# Patient Record
Sex: Female | Born: 1970 | Race: Black or African American | Hispanic: No | Marital: Single | Smoking: Never smoker
Health system: Southern US, Community
[De-identification: ages and names within clinical notes are randomized; demographics above are authoritative.]

## PROBLEM LIST (undated history)

## (undated) DIAGNOSIS — K5900 Constipation, unspecified: Secondary | ICD-10-CM

## (undated) DIAGNOSIS — K219 Gastro-esophageal reflux disease without esophagitis: Secondary | ICD-10-CM

## (undated) DIAGNOSIS — I10 Essential (primary) hypertension: Secondary | ICD-10-CM

## (undated) DIAGNOSIS — J302 Other seasonal allergic rhinitis: Secondary | ICD-10-CM

## (undated) DIAGNOSIS — E785 Hyperlipidemia, unspecified: Secondary | ICD-10-CM

## (undated) DIAGNOSIS — J45909 Unspecified asthma, uncomplicated: Secondary | ICD-10-CM

## (undated) DIAGNOSIS — D649 Anemia, unspecified: Secondary | ICD-10-CM

## (undated) DIAGNOSIS — R519 Headache, unspecified: Secondary | ICD-10-CM

## (undated) DIAGNOSIS — Z139 Encounter for screening, unspecified: Secondary | ICD-10-CM

## (undated) DIAGNOSIS — G44209 Tension-type headache, unspecified, not intractable: Secondary | ICD-10-CM

## (undated) DIAGNOSIS — R42 Dizziness and giddiness: Secondary | ICD-10-CM

## (undated) DIAGNOSIS — Z1231 Encounter for screening mammogram for malignant neoplasm of breast: Secondary | ICD-10-CM

## (undated) HISTORY — DX: Hyperlipidemia, unspecified: E78.5

## (undated) HISTORY — DX: Other seasonal allergic rhinitis: J30.2

## (undated) HISTORY — PX: HERNIA REPAIR: SHX51

## (undated) HISTORY — PX: DILATION AND CURETTAGE OF UTERUS: SHX78

## (undated) HISTORY — PX: UPPER GASTROINTESTINAL ENDOSCOPY: SHX188

## (undated) HISTORY — DX: Constipation, unspecified: K59.00

## (undated) HISTORY — PX: TUBAL LIGATION: SHX77

## (undated) HISTORY — PX: BREAST BIOPSY: SHX20

---

## 2007-07-17 LAB — H PYLORI, TISSUE (LAB): H. pylori from tissue: NEGATIVE

## 2007-07-17 NOTE — Op Note (Signed)
Name: Vanessa Kramer, Vanessa Kramer  MR #: [id number] Surgeon: Daralene Milch, M.D.  Account #: [id number] Surgery Date: 07/17/2007  DOB: 10/08/1970  Age: 37 Location:     OPERATIVE REPORT        PROCEDURE: Esophagogastroduodenoscopy.    INDICATION: This is a young 37 year old African American woman who  underwent a Nissen fundoplication 10 years ago, is now having recurrent  acid reflux, rule out esophagitis, rule out incompetent wrap.    PREMEDICATIONS:  1. Demerol 50 mg intravenously.  2. Versed 5 mg intravenously.    ASSISTANT: Nicky Pugh.    SCOPE: Olympus video upper endoscope.    DESCRIPTION OF PROCEDURE: Informed consent obtained after procedure  explained in detail including the risks, benefits, and alternatives.  Oropharynx topically anesthetized, Cetacaine spray and bite block inserted.  The esophagus contained normal mucosa. No evidence of esophagitis. There  was a normal Z-line at 40 cm from the incisors. There was, however,  decreased lower esophageal sphincter tone. Certainly no stenosis was  encountered as we advanced to the stomach and retroflexed the scope. We  had a difficult time visualizing the wrap due to the patient's ongoing  retching, which resulted in a small amount of bleeding from her retching at  the GE junction. The distal stomach and the antrum had diffuse gastritis  without erosion. Biopsy obtained for Helicobacter pylori. The scope  advanced into the duodenum where normal mucosa seen to the second portion.  Scope removed, procedure terminated.    FINDINGS:  1. Decreased lower esophageal sphincter tone, prior Nissen  fundoplication.  2. Gastritis.    RECOMMENDATION: The patient will likely need to be begun on a proton pump  inhibitor therapy daily and continue, and we will consider her for barium  swallow.          E-Signed By  Daralene Milch, M.D. 07/25/2007 13:19  Daralene Milch, M.D.    cc: Otila Kluver, M.D.   Daralene Milch, M.D.         MM/FI2; D: 07/17/2007 11:36 A; T: 07/17/2007 11:05 P; Doc# [id number]; Job#  [id number]

## 2007-07-17 NOTE — Op Note (Signed)
Op Notes signed by  at 07/25/07 1319                  Author: Daralene Milch, MD  Service: --  Author Type: Physician            Filed:   Date of Service: 07/17/07 2305  Status: Signed          <!--EPICS--> Name:      Vanessa Kramer, Vanessa Kramer<BR> MR #:      [id number]                    Surgeon:        Daralene Milch, M.D.<BR> Account #: [id number]                  Surgery Date:   07/17/2007<BR> DOB:       [birth year]<BR> Age:       37                           Location:<BR> <BR>                              OPERATIVE REPORT<BR> <BR> <BR> <BR> PROCEDURE:  Esophagogastroduodenoscopy.<BR> <BR> INDICATION:   This is a young 37 year old African American woman who<BR> underwent a Nissen fundoplication 10 years ago, is now having recurrent<BR> acid reflux, rule out esophagitis, rule out incompetent wrap.<BR> <BR> PREMEDICATIONS:<BR> 1.     Demerol 50 mg intravenously.<BR>  2.     Versed 5 mg intravenously.<BR> <BR> ASSISTANT:  Nicky Pugh.<BR> <BR> SCOPE:  Olympus video upper endoscope.<BR> <BR> DESCRIPTION OF PROCEDURE:  Informed consent obtained after procedure<BR> explained in detail including the risks, benefits, and  alternatives.<BR> Oropharynx topically anesthetized, Cetacaine spray and bite block inserted.<BR> The esophagus contained normal mucosa.  No evidence of esophagitis.  There<BR> was a normal Z-line at 40 cm from the incisors.  There was, however,<BR> decreased  lower esophageal sphincter tone.  Certainly no stenosis was<BR> encountered as we advanced to the stomach and retroflexed the scope.  We<BR> had a difficult time visualizing the wrap due to the patient's ongoing<BR> retching, which resulted in a small  amount of bleeding from her retching at<BR> the GE junction.  The distal stomach and the antrum had diffuse gastritis<BR> without erosion.  Biopsy obtained for Helicobacter pylori.  The scope<BR> advanced into the duodenum where normal mucosa seen to  the second portion.<BR> Scope removed,  procedure terminated.<BR> <BR> FINDINGS:<BR> 1.     Decreased lower esophageal sphincter tone, prior Nissen<BR> fundoplication.<BR> 2.     Gastritis.<BR> <BR> RECOMMENDATION:  The patient will likely need to be begun  on a proton pump<BR> inhibitor therapy daily and continue, and we will consider her for barium<BR> swallow.<BR> <BR> <BR> <BR> <BR> E-Signed By<BR> Daralene Milch, M.D. 07/25/2007 13:19<BR> Daralene Milch, M.D.<BR> <BR> cc:   Otila Kluver, M.D.<BR>        Daralene Milch, M.D.<BR> <BR> <BR> <BR> MM/FI2; D: 07/17/2007 11:36 A; T: 07/17/2007 11:05 P; Doc# [id number]; Job#<BR> [id number]<BM> <!--EPICE-->

## 2009-06-24 MED ORDER — LISINOPRIL 10 MG TAB
10 mg | ORAL_TABLET | Freq: Every day | ORAL | Status: DC
Start: 2009-06-24 — End: 2009-09-09

## 2009-06-24 MED ORDER — AZITHROMYCIN 250 MG TAB
250 mg | ORAL_TABLET | ORAL | Status: AC
Start: 2009-06-24 — End: 2009-06-29

## 2009-06-24 MED ORDER — MECLIZINE 12.5 MG TAB
12.5 mg | ORAL_TABLET | Freq: Three times a day (TID) | ORAL | Status: DC | PRN
Start: 2009-06-24 — End: 2009-09-24

## 2009-06-24 MED ORDER — PROMETHAZINE 25 MG TAB
25 mg | ORAL_TABLET | Freq: Four times a day (QID) | ORAL | Status: DC | PRN
Start: 2009-06-24 — End: 2011-09-18

## 2009-06-24 NOTE — Progress Notes (Signed)
HISTORY OF PRESENT ILLNESS  Vanessa Kramer is a 39 y.o. female who comes in with four day history of vertigo.  She's had this before, and simply notices it when she does have allergic problems with the environment.  She is taking Zyrtec, which is not helped.  She said no falls yet but feels like she could have.  She is a non-smoker.  She is also in to establish care.  She's had a history of high cholesterol and high blood pressure, but is not on treatment for either at this time.  HPI    Review of Systems   Constitutional: Negative for weight loss and malaise/fatigue.   HENT: Positive for congestion.    Eyes: Negative for double vision.   Respiratory: Negative for cough and shortness of breath.    Cardiovascular: Negative for orthopnea.   Gastrointestinal: Positive for nausea. Negative for vomiting and abdominal pain.   Musculoskeletal: Negative for myalgias.   Skin: Negative for rash.   Neurological: Positive for dizziness. Negative for headaches.   Psychiatric/Behavioral: The patient is not nervous/anxious.      BP 155/96   Pulse 94   Temp(Src) 97.8 ??F (36.6 ??C) (Oral)   Resp 18   Ht 5' 5.5" (1.664 m)   Wt 111 lb 12.8 oz (50.712 kg)   BMI 18.32 kg/m2   SpO2 100%  Physical Exam   Nursing note and vitals reviewed.  Constitutional: She is oriented to person, place, and time. She appears well-developed and well-nourished.   HENT:   Head: Normocephalic.   Right Ear: External ear normal.   Left Ear: External ear normal.   Mouth/Throat: Mucous membranes are dry. Oropharyngeal exudate and posterior oropharyngeal erythema present. No posterior oropharyngeal edema.         Eyes: Conjunctivae are normal. Pupils are equal, round, and reactive to light. Right conjunctiva is not injected. Left conjunctiva is not injected. Left eye exhibits nystagmus.         Neck: Normal range of motion. Neck supple. No thyromegaly present.   Cardiovascular: Normal rate, normal heart sounds and intact distal pulses.    No murmur heard.   Pulmonary/Chest: Effort normal and breath sounds normal. She has no wheezes. She has no rales.   Abdominal: Soft. Bowel sounds are normal. She exhibits no mass. She has no guarding.   Musculoskeletal: Normal range of motion. She exhibits no edema.   Lymphadenopathy:     She has no cervical adenopathy.   Neurological: She is alert and oriented to person, place, and time. She has normal reflexes.   Skin: Skin is warm.   Psychiatric: Her behavior is normal. Judgment and thought content normal.       ASSESSMENT and PLAN  1. Dizziness (780.4A)  meclizine (ANTIVERT) 12.5 mg tablet, promethazine (PHENERGAN) 25 mg tablet   2. HTN (hypertension) (401.9AF)  lisinopril (PRINIVIL, ZESTRIL) 10 mg tablet   3. Vertigo (780.4K)     4. Pharyngitis (462Y)  azithromycin (ZITHROMAX Z-PAK) 250 mg tablet   5. Routine health maintenance (V72.9C)  CBC WITH AUTOMATED DIFF, LIPID PANEL, TSH, 3RD GENERATION, T4, FREE, METABOLIC PANEL, COMPREHENSIVE       [name] was seen today for establish care and dizziness.    Diagnoses and associated orders for this visit:    Dizziness  - meclizine (ANTIVERT) 12.5 mg tablet; Take 1 Tab by mouth three (3) times daily as needed.  - promethazine (PHENERGAN) 25 mg tablet; Take 1 Tab by mouth every six (6) hours as  needed for Nausea.    Htn (hypertension)  - lisinopril (PRINIVIL, ZESTRIL) 10 mg tablet; Take 1 Tab by mouth daily.    Vertigo    Pharyngitis  - azithromycin (ZITHROMAX Z-PAK) 250 mg tablet; Take  by mouth for 5 days. Take two tablets today then one tablet daily    Routine health maintenance  - CBC WITH AUTOMATED DIFF  - LIPID PANEL  - TSH, 3RD GENERATION  - T4, FREE  - METABOLIC PANEL, COMPREHENSIVE    Other Orders  - Cancel: ALT        Follow-up Disposition:  Return in about 2 months (around 08/24/2009), or as needed.   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  It is time with her history of blood pressure and cholesterol problems, we will check a full panel of blood work.  We will also start lisinopril 10 mg a day as she had been on treatment she stopped in the past.  She is warned about driving and climbing while the vertigo bothers her.  And I will take her out of work for the next four days.  She will call with her improvement with treatment next week.  She is asked to use mucinex is a mucolytic and Zantac as a non-drying histamine blocker.

## 2009-06-24 NOTE — Patient Instructions (Addendum)
MyChart Activation    Thank you for enrolling in MyChart. Please follow the instructions below to securely access your online medical record. MyChart allows you to send messages to your doctor, view your test results, renew your prescriptions, schedule appointments, and more.    How Do I Sign Up?    1. In your internet browser, go to [email or web address].  2. Click on the First Time User? Click Here link in the Sign In box. You will see the New Member Sign Up page.  3. Enter your MyChart Access Code exactly as it appears below. You will not need to use this code after you???ve completed the sign-up process. If you do not sign up before the expiration date, you must request a new code.    MyChart Access Code: [email or web address]  Expires: 08/23/09 02:21 PM     4. Enter the last four digits of your Social Security Number (xxxx) and Date of Birth (mm/dd/yyyy) as indicated and click Submit. You will be taken to the next sign-up page.  5. Create a MyChart ID. This will be your MyChart login ID and cannot be changed, so think of one that is secure and easy to remember.  6. Create a MyChart password. You can change your password at any time.  7. Enter your Password Reset Question and Answer. This can be used at a later time if you forget your password.   8. Enter your e-mail address. You will receive e-mail notification when new information is available in MyChart.  9. Click Sign Up. You can now view your medical record.    Additional Information    If you have questions, please call [phone].  Remember, MyChart is NOT to be used for urgent needs. For medical emergencies, dial 911.  English   Spanish  Dizziness: After Your Visit  Your Care Instructions   Dizziness is the feeling of unsteadiness or fuzziness in your head. It is different than having vertigo, which is a feeling that the room is spinning or that you are moving or falling. It is also different from lightheadedness, which is the feeling that you are about to faint.  It can be hard to know what causes dizziness. Some people feel dizzy when they have migraine headaches. Sometimes bouts of flu can make you feel dizzy. Some medical conditions, such as heart problems or high blood pressure, can make you feel dizzy. Many medicines can cause dizziness, including medicines for high blood pressure, pain, or anxiety.  If a medicine causes your symptoms, your doctor may recommend that you stop or change the medicine. If it is a problem with your heart, you may need medicine to help your heart work better. If there is no clear reason for your symptoms, your doctor may suggest watching and waiting for a while to see if the dizziness goes away on its own.  Follow-up care is a key part of your treatment and safety. Be sure to make and go to all appointments, and call your doctor if you are having problems. It???s also a good idea to know your test results and keep a list of the medicines you take.  How can you care for yourself at home?  ?? If your doctor recommends or prescribes medicine, take it exactly as directed. Call your doctor if you think you are having a problem with your medicine.   ?? Do not drive while you feel dizzy.   ?? Try to prevent falls. Steps you can take include:   ??  Using nonskid mats, adding grab bars near the tub, and using night-lights.   ?? Clearing your home so that walkways are free of anything you might trip on.   ?? Letting family and friends know that you have been feeling dizzy. This will help them know how to help you.  When should you call for help?  Call 911 anytime you think you may need emergency care. For example, call if:  ?? You passed out (lost consciousness).    ?? You have dizziness along with signs of a heart attack, which may include:   ?? Chest pain.   ?? Sweating.   ?? Shortness of breath.   ?? Nausea or vomiting.   ?? Pain that spreads from the chest to the neck, jaw, or one or both shoulders or arms.   ?? A fast or uneven pulse.  ?? Dizziness occurs with signs of a stroke. These may include:   ?? Sudden numbness, paralysis, or weakness in your face, arm, or leg, especially on only one side of your body.   ?? New problems with walking or balance.   ?? Drooling or slurred speech.   ?? Sudden vision changes.   ?? New problems speaking or understanding simple statements, or feeling confused.   ?? A sudden, severe headache that is different from past headaches.  Call your doctor now or seek immediate medical care if:  ?? You feel dizzy and have a fever, headache, or ringing in your ears.   ?? You have new or increased nausea and vomiting.   ?? Your dizziness does not go away or comes back.  Watch closely for changes in your health, and be sure to contact your doctor if:  ?? You do not get better as expected.    Where can you learn more?   Go to [facility]  Enter Q823 in the search box to learn more about "Dizziness: After Your Visit."   ?? 1995-2010 [employer]. Care instructions adapted under license by [employer] (which disclaims liability or warranty for this information). This care instruction is for use with your licensed healthcare professional. If you have questions about a medical condition or this instruction, always ask your healthcare professional. Healthwise disclaims any warranty or liability for your use of this information.  Content Version: 8.8.72453; Last Revised: September 23, 2009English   Spanish  High Blood Pressure: After Your Visit  Your Care Instructions   If your blood pressure is usually above 140/90, you have high blood pressure, or hypertension. Despite what a lot of people think, high blood pressure usually does not cause headaches or make you feel dizzy or lightheaded. It usually has no symptoms. But it does increase your risk for heart attack, stroke, and kidney or eye damage. The higher your blood pressure, the more your risk increases.  Changes in your lifestyle, such as staying at a healthy weight, may help you lower your blood pressure. Your treatment also will include medicine. If you stop taking your medicine, your blood pressure will go back up.  Follow-up care is a key part of your treatment and safety. Be sure to make and go to all appointments, and call your doctor if you are having problems. It???s also a good idea to know your test results and keep a list of the medicines you take.  How can you care for yourself at home?  Medical treatment  ?? Take your medicine exactly as prescribed. You may take one or more types of medicine  to lower your blood pressure. They include diuretics, beta-blockers, ACE inhibitors, calcium channel blockers, angiotensin II receptor blockers, and other medicines. Call your doctor if you think you are having a problem with your medicine.   ?? Your doctor may suggest that you take one low-dose aspirin (81 mg) a day. This can help reduce your risk of having a stroke or heart attack.   ?? See your doctor at least 2 times a year. You may need to see the doctor more often at first or until your blood pressure comes down.   ?? If you are taking blood pressure medicine, talk to your doctor before you take decongestants or anti-inflammatory medicine, such as ibuprofen. Some of these medicines can raise blood pressure.   ?? Learn how to check your blood pressure at home.  Lifestyle changes   ?? Stay at a healthy weight. This is especially important if you put on weight around the waist. Losing even 10 pounds can help you lower your blood pressure.   ?? If your doctor recommends it, get more exercise. Walking is a good choice. Bit by bit, increase the amount you walk every day. Try for at least 30 minutes on most days of the week. You also may want to swim, bike, or do other activities.   ?? Avoid or limit alcohol. Talk to your doctor about whether you can drink any alcohol.   ?? Limit salt.   ?? Eat plenty of fruits (such as bananas and oranges), vegetables, legumes, whole grains, and low-fat dairy products.   ?? Lower the amount of saturated fat in your diet. Saturated fat is found in animal products such as milk, cheese, and meat. Limiting these foods may help you lose weight and also lower your risk for heart disease.   ?? Do not smoke. Smoking increases your risk for heart attack and stroke. If you need help quitting, talk to your doctor about stop-smoking programs and medicines. These can increase your chances of quitting for good.  When should you call for help?  Call 911 anytime you think you may need emergency care. For example, call if:  ?? You have chest pain or pressure. This may occur with:   ?? Sweating.   ?? Shortness of breath.   ?? Nausea or vomiting.   ?? Pain that spreads from the chest to the neck, jaw, or one or both shoulders or arms.   ?? Dizziness or lightheadedness.   ?? A fast or uneven pulse.  After calling 911, chew 1 adult-strength aspirin. Wait for an ambulance. Do not try to drive yourself.   ?? You have signs of a stroke. These include:   ?? Sudden numbness, paralysis, or weakness in your face, arm, or leg, especially on only one side of your body.   ?? New problems with walking or balance.   ?? Sudden vision changes.   ?? New problems speaking or understanding simple statements, or feeling confused.   ?? A sudden, severe headache that is different from past headaches.   Call your doctor now or seek immediate medical care if:  ?? Your blood pressure rises suddenly.   ?? Your blood pressure is 180/110 or higher.   ?? You are dizzy or lightheaded, or you feel like you may faint.   ?? Your blood pressure is higher than 140/90 on two or more occasions.  Watch closely for changes in your health, and be sure to contact your doctor if:  ?? You do not  get better as expected.    Where can you learn more?   Go to [facility]  Enter [phone] in the search box to learn more about "High Blood Pressure: After Your Visit."   ?? 1995-2010 [employer]wise, [employer]. Care instructions adapted under license by [employer] (which disclaims liability or warranty for this information). This care instruction is for use with your licensed healthcare professional. If you have questions about a medical condition or this instruction, always ask your healthcare professional. Healthwise disclaims any warranty or liability for your use of this information.  Content Version: 8.8.72453; Last Revised: May 8, 2009English   Spanish  Vertigo: After Your Visit  Your Care Instructions  Vertigo is the feeling that you or your surroundings are moving when there is no actual movement. It is often described as a feeling of spinning, whirling, falling, or tilting. Vertigo may make you vomit or feel nauseated. You may have trouble standing or walking and may lose your balance.  Vertigo is often related to an inner ear problem, but it can have other more serious causes. If vertigo continues, you may need more tests to find its cause.  Follow-up care is a key part of your treatment and safety. Be sure to make and go to all appointments, and call your doctor if you are having problems. It???s also a good idea to know your test results and keep a list of the medicines you take.  How can you care for yourself at home?   ?? Do not lie flat on your back. Prop yourself up slightly. This may reduce the spinning feeling. Keep your eyes open.   ?? Move slowly so that you do not fall.   ?? If your doctor recommends medicine, take it exactly as directed.   ?? Do not drive while you are having vertigo.  Certain exercises, called Brandt-Daroff exercises, can help decrease vertigo. To do Brandt-Daroff exercises:  ?? Sit on the edge of a bed or sofa and quickly lie down on the side that causes the worst vertigo. Lie on your side with your ear down.   ?? Stay in this position for at least 30 seconds or until the vertigo goes away.   ?? Sit up. If this causes vertigo, wait for it to stop.   ?? Repeat the procedure on the other side.   ?? Repeat this 10 times. Do these exercises 2 times a day until the vertigo is gone.  When should you call for help?  Call 911 anytime you think you may need emergency care. For example, call if:  ?? You passed out (lost consciousness).   ?? Vertigo occurs with signs of a stroke. These include:   ?? Sudden numbness, paralysis, or weakness in your face, arm, or leg, especially on only one side of your body.   ?? New problems with walking or balance.   ?? Sudden vision changes.   ?? Drooling or slurred speech.   ?? New problems speaking or understanding simple statements, or feeling confused.   ?? A sudden, severe headache that is different from past headaches.  Call your doctor now or seek immediate medical care if:  ?? Vertigo occurs with a fever, a headache, or ringing in your ears.   ?? You have new or increased nausea and vomiting.  Watch closely for changes in your health, and be sure to contact your doctor if:  ?? Vertigo gets worse or happens more often.   ?? Vertigo has not gotten better  after 2 weeks.    Where can you learn more?   Go to [facility]  Enter [phone] in the search box to learn more about "Vertigo: After Your Visit."    ?? 1995-2010 Healthwise, [employer]. Care instructions adapted under license by [employer] (which disclaims liability or warranty for this information). This care instruction is for use with your licensed healthcare professional. If you have questions about a medical condition or this instruction, always ask your healthcare professional. Healthwise disclaims any warranty or liability for your use of this information.  Content Version: 8.8.72453; Last Revised: May 08, 2007

## 2009-06-25 ENCOUNTER — Telehealth

## 2009-06-25 NOTE — Telephone Encounter (Signed)
Message copied by Midge Minium on Fri Jun 25, 2009 11:43 AM  ------       Message from: Fonda Kinder       Created: Fri Jun 25, 2009  9:31 AM       Regarding: Dr Maple Hudson         dob 03/05/1970  Patient needs for doctor to change the location of her lab work orders; she says the hospital needs to be changed; her number is [phone].

## 2009-06-25 NOTE — Telephone Encounter (Signed)
Per patient her insurance will not pay for her to have labs done at [facility], requesting a new order for [facility].  Informed patient order will be at front desk for pickup.

## 2009-06-29 NOTE — Telephone Encounter (Signed)
Per pt you wanted her to call & give you update on how she is doing    Pt states that her dizziness has subside, but she was still having sore throat and don't think the antibiotic is helping. Pt would like to know what you advise.

## 2009-06-29 NOTE — Telephone Encounter (Signed)
I called pt no no answer-left vm of instructions per Dr Maple Hudson. I will try to call pt again.    From Loura Pardon, MD Sent Tuesday Jun 29, 2009 11:10 AM To Lynnell Dike   Message I suspect it may be viral and could take 7-10 days to resolve; if not, I should see her again.

## 2009-07-01 NOTE — Telephone Encounter (Signed)
I called pt no answer- no option to leave vm.

## 2009-07-06 NOTE — Telephone Encounter (Signed)
I called pt no answer-left vm. Left messages w/ no call back.

## 2009-09-09 MED ORDER — AMLODIPINE 2.5 MG TAB
2.5 mg | ORAL_TABLET | Freq: Every day | ORAL | Status: DC
Start: 2009-09-09 — End: 2010-02-08

## 2009-09-09 NOTE — Patient Instructions (Addendum)
MyChart Activation    Thank you for requesting access to MyChart. Please follow the instructions below to securely access and download your online medical record. MyChart allows you to send messages to your doctor, view your test results, renew your prescriptions, schedule appointments, and more.    How Do I Sign Up?    1. In your internet browser, go to [email or web address].  2. Click on the First Time User? Click Here link in the Sign In box. You will see the New Member Sign Up page.  3. Enter your MyChart Access Code exactly as it appears below. You will not need to use this code after you???ve completed the sign-up process. If you do not sign up before the expiration date, you must request a new code.    MyChart Access Code: Not generated  Current MyChart Status: Active     4. Enter the last four digits of your Social Security Number (xxxx) and Date of Birth (mm/dd/yyyy) as indicated and click Submit. You will be taken to the next sign-up page.  5. Create a MyChart ID. This will be your MyChart login ID and cannot be changed, so think of one that is secure and easy to remember.  6. Create a MyChart password. You can change your password at any time.  7. Enter your Password Reset Question and Answer. This can be used at a later time if you forget your password.   8. Enter your e-mail address. You will receive e-mail notification when new information is available in MyChart.  9. Click Sign Up. You can now view and download portions of your medical record.  10. Click the Download Summary menu link to download a portable copy of your medical information.    Additional Information    If you have questions, please call [phone]. Remember, MyChart is NOT to be used for urgent needs. For medical emergencies, dial 911.    English   Spanish  High Blood Pressure: After Your Visit  Your Care Instructions   If your blood pressure is usually above 140/90, you have high blood pressure, or hypertension. Despite what a lot of people think, high blood pressure usually does not cause headaches or make you feel dizzy or lightheaded. It usually has no symptoms. But it does increase your risk for heart attack, stroke, and kidney or eye damage. The higher your blood pressure, the more your risk increases.  Changes in your lifestyle, such as staying at a healthy weight, may help you lower your blood pressure. Your treatment also will include medicine. If you stop taking your medicine, your blood pressure will go back up.  Follow-up care is a key part of your treatment and safety. Be sure to make and go to all appointments, and call your doctor if you are having problems. It???s also a good idea to know your test results and keep a list of the medicines you take.  How can you care for yourself at home?  Medical treatment  ?? Take your medicine exactly as prescribed. You may take one or more types of medicine to lower your blood pressure. They include diuretics, beta-blockers, ACE inhibitors, calcium channel blockers, angiotensin II receptor blockers, and other medicines. Call your doctor if you think you are having a problem with your medicine.   ?? Your doctor may suggest that you take one low-dose aspirin (81 mg) a day. This can help reduce your risk of having a stroke or heart attack.   ?? See your doctor  at least 2 times a year. You may need to see the doctor more often at first or until your blood pressure comes down.   ?? If you are taking blood pressure medicine, talk to your doctor before you take decongestants or anti-inflammatory medicine, such as ibuprofen. Some of these medicines can raise blood pressure.   ?? Learn how to check your blood pressure at home.  Lifestyle changes   ?? Stay at a healthy weight. This is especially important if you put on weight around the waist. Losing even 10 pounds can help you lower your blood pressure.   ?? If your doctor recommends it, get more exercise. Walking is a good choice. Bit by bit, increase the amount you walk every day. Try for at least 30 minutes on most days of the week. You also may want to swim, bike, or do other activities.   ?? Avoid or limit alcohol. Talk to your doctor about whether you can drink any alcohol.   ?? Limit salt.   ?? Eat plenty of fruits (such as bananas and oranges), vegetables, legumes, whole grains, and low-fat dairy products.   ?? Lower the amount of saturated fat in your diet. Saturated fat is found in animal products such as milk, cheese, and meat. Limiting these foods may help you lose weight and also lower your risk for heart disease.   ?? Do not smoke. Smoking increases your risk for heart attack and stroke. If you need help quitting, talk to your doctor about stop-smoking programs and medicines. These can increase your chances of quitting for good.  When should you call for help?  Call 911 anytime you think you may need emergency care. For example, call if:  ?? You have chest pain or pressure. This may occur with:   ?? Sweating.   ?? Shortness of breath.   ?? Nausea or vomiting.   ?? Pain that spreads from the chest to the neck, jaw, or one or both shoulders or arms.   ?? Dizziness or lightheadedness.   ?? A fast or uneven pulse.  After calling 911, chew 1 adult-strength aspirin. Wait for an ambulance. Do not try to drive yourself.   ?? You have signs of a stroke. These include:   ?? Sudden numbness, paralysis, or weakness in your face, arm, or leg, especially on only one side of your body.   ?? New problems with walking or balance.   ?? Sudden vision changes.   ?? New problems speaking or understanding simple statements, or feeling confused.   ?? A sudden, severe headache that is different from past headaches.   Call your doctor now or seek immediate medical care if:  ?? Your blood pressure rises suddenly.   ?? Your blood pressure is 180/110 or higher.   ?? You are dizzy or lightheaded, or you feel like you may faint.   ?? Your blood pressure is higher than 140/90 on two or more occasions.  Watch closely for changes in your health, and be sure to contact your doctor if:  ?? You do not get better as expected.    Where can you learn more?   Go to [email or web address]  Enter [phone] in the search box to learn more about "High Blood Pressure: After Your Visit."   ?? 2006-2011 [employer]. Care instructions adapted under license by [employer] (which disclaims liability or warranty for this information). This care instruction is for use with your licensed healthcare professional. If you have questions  about a medical condition or this instruction, always ask your healthcare professional. [employer], [employer] disclaims any warranty or liability for your use of this information.  Content Version: 8.9.83828; Last Revised: May 8, 2009English   Spanish  [place] Exercises for Vertigo: After Your Visit  Your Care Instructions  Simple exercises can help you regain your balance when you have vertigo. If you have M??ni??re's disease, benign paroxysmal positional vertigo (BPPV), or another inner ear problem, you may have vertigo off and on.  Do these exercises first thing in the morning and before you go to bed. You might get dizzy when you first start them. If this happens, try to do them for at least 5 minutes. Do a group of exercises at a time, starting at the top of the list. It may take several weeks before you can do all the exercises without feeling dizzy.  Follow-up care is a key part of your treatment and safety. Be sure to make and go to all appointments, and call your doctor if you are having problems. It???s also a good idea to know your test results and keep a list of the medicines you take.   How can you care for yourself at home?  Exercise 1  While sitting on the side of the bed and holding your head still:  ?? Look up as far as you can.   ?? Look down as far as you can.   ?? Look from side to side as far as you can.   ?? Stretch your arm straight out in front of you. Focus on your index finger. Continue to focus on your finger while you bring it to your nose.  Exercise 2  While sitting on the side of the bed:  ?? Bring your head as far back as you can.   ?? Bring your head forward to touch your chin to your chest.   ?? Turn your head from side to side.   ?? Do these exercises first with your eyes open. Then try with your eyes closed.  Exercise 3  While sitting on the side of the bed:  ?? Shrug your shoulders straight upward, then relax them.   ?? Bend over and try to touch the ground with your fingers. Then go back to a sitting position.   ?? Toss a small ball from one hand to the other. Throw the ball higher than your eyes so you have to look up.  Exercise 4  While standing (with someone close by if you feel uncomfortable):  ?? Repeat Exercise 1.   ?? Repeat Exercise 2.   ?? Pass a ball between your legs and above your head.   ?? Sit down and then stand up. Repeat. Turn around in a circle a different way each time you stand.   ?? With someone close by to help you, try the above exercises with your eyes closed.  Exercise 5  In a room that is cleared of obstacles:  ?? Walk to a corner of the room, turn to your right, and walk back to the starting point. Now, repeat and turn left.   ?? Walk up and down a slope. Now try stairs.   ?? While holding on to someone's arm, try these exercises with your eyes closed.  When should you call for help?  Watch closely for changes in your health, and be sure to contact your doctor if:  ?? Your vertigo gets worse.   ?? You want more information  about vertigo.   ?? You want more information about exercises for vertigo.    Where can you learn more?   Go to [email or web address]   Enter A743 in the search box to learn more about "[place] Exercises for Vertigo: After Your Visit."   ?? 2006-2011 [employer]. Care instructions adapted under license by [employer] (which disclaims liability or warranty for this information). This care instruction is for use with your licensed healthcare professional. If you have questions about a medical condition or this instruction, always ask your healthcare professional. [employer] disclaims any warranty or liability for your use of this information.  Content Version: 8.9.83828; Last Revised: January 14, 2011English   Spanish  Vertigo: After Your Visit  Your Care Instructions  Vertigo is the feeling that you or your surroundings are moving when there is no actual movement. It is often described as a feeling of spinning, whirling, falling, or tilting. Vertigo may make you vomit or feel nauseated. You may have trouble standing or walking and may lose your balance.  Vertigo is often related to an inner ear problem, but it can have other more serious causes. If vertigo continues, you may need more tests to find its cause.  Follow-up care is a key part of your treatment and safety. Be sure to make and go to all appointments, and call your doctor if you are having problems. It???s also a good idea to know your test results and keep a list of the medicines you take.  How can you care for yourself at home?  ?? Do not lie flat on your back. Prop yourself up slightly. This may reduce the spinning feeling. Keep your eyes open.   ?? Move slowly so that you do not fall.   ?? If your doctor recommends medicine, take it exactly as directed.   ?? Do not drive while you are having vertigo.  Certain exercises, called Brandt-Daroff exercises, can help decrease vertigo. To do Brandt-Daroff exercises:  ?? Sit on the edge of a bed or sofa and quickly lie down on the side that causes the worst vertigo. Lie on your side with your ear down.    ?? Stay in this position for at least 30 seconds or until the vertigo goes away.   ?? Sit up. If this causes vertigo, wait for it to stop.   ?? Repeat the procedure on the other side.   ?? Repeat this 10 times. Do these exercises 2 times a day until the vertigo is gone.  When should you call for help?  Call 911 anytime you think you may need emergency care. For example, call if:  ?? You passed out (lost consciousness).   ?? Vertigo occurs with signs of a stroke. These include:   ?? Sudden numbness, paralysis, or weakness in your face, arm, or leg, especially on only one side of your body.   ?? New problems with walking or balance.   ?? Sudden vision changes.   ?? Drooling or slurred speech.   ?? New problems speaking or understanding simple statements, or feeling confused.   ?? A sudden, severe headache that is different from past headaches.  Call your doctor now or seek immediate medical care if:  ?? Vertigo occurs with a fever, a headache, or ringing in your ears.   ?? You have new or increased nausea and vomiting.  Watch closely for changes in your health, and be sure to contact your doctor if:  ?? Vertigo gets worse  or happens more often.   ?? Vertigo has not gotten better after 2 weeks.    Where can you learn more?   Go to [facility]  Enter [phone] in the search box to learn more about "Vertigo: After Your Visit."   ?? 2006-2011 [employer]. Care instructions adapted under license by [employer] (which disclaims liability or warranty for this information). This care instruction is for use with your licensed healthcare professional. If you have questions about a medical condition or this instruction, always ask your healthcare professional. [employer] disclaims any warranty or liability for your use of this information.  Content Version: 8.9.83828; Last Revised: May 08, 2007

## 2009-09-09 NOTE — Progress Notes (Signed)
HISTORY OF PRESENT ILLNESS  Vanessa Kramer is a 39 y.o. female who comes in for follow-up.  I've reviewed her outside lab including an optimal HDL and cholesterol panel.  She was unable to tolerate lisinopril because it caused a rash and blistering about round her lips.  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  HPI    Review of Systems   Constitutional: Negative for weight loss.   Eyes: Negative for blurred vision.   Respiratory: Negative for cough and shortness of breath.    Cardiovascular: Negative for chest pain.   Gastrointestinal: Negative for abdominal pain.   Musculoskeletal: Negative for myalgias.   Skin: Positive for rash.   Neurological: Negative for dizziness and headaches.   Psychiatric/Behavioral: The patient is not nervous/anxious.      BP 134/90   Pulse 78   Temp(Src) 97.3 ??F (36.3 ??C) (Oral)   Resp 16   Ht 5\' 4"  (1.626 m)   Wt 112 lb (50.803 kg)   BMI 19.22 kg/m2   SpO2 99%  Physical Exam   Nursing note and vitals reviewed.  Constitutional: She is oriented to person, place, and time. She appears well-developed and well-nourished.        HENT:   Head: Normocephalic.   Mouth/Throat: Oropharynx is clear and moist.   Eyes: Pupils are equal, round, and reactive to light.   Neck: Normal range of motion. Neck supple.   Cardiovascular: Normal rate, normal heart sounds and intact distal pulses.    Pulmonary/Chest: Effort normal and breath sounds normal.   Abdominal: Soft. Bowel sounds are normal.   Musculoskeletal: Normal range of motion. She exhibits no edema.   Lymphadenopathy:     She has no cervical adenopathy.   Neurological: She is alert and oriented to person, place, and time. She has normal reflexes.   Skin: Skin is warm and dry.   Psychiatric: She has a normal mood and affect. Her behavior is normal. Judgment and thought content normal.     HDL 85 other lab normal  ASSESSMENT and PLAN  1. HTN (hypertension) (401.9AF)    2. Vertigo (780.4K)         Vanessa Kramer was seen today for follow-up and results.    Diagnoses and associated orders for this visit:    Htn (hypertension)    Vertigo    Other Orders  - amlodipine (NORVASC) 2.5 mg tablet; Take 1 Tab by mouth daily.        Follow-up Disposition:  Return in about 4 months (around 01/10/2010), or as noted.  Vanessa Kramer is doing very well and is actually an ideal patient from the standpoint of health maintenance issues.  She is up-to-date on all of her needs and manages a very healthy lifestyle with diet and exercise, weight control and awareness.  She may have had a reaction to the ACE inhibitor, and I'll change that medication to a low dose of amlodipine.  She will notify me with any side effects.  Otherwise we will turn her in the next four months, and for pressures doing well at that time make her a yearly for visits.

## 2009-09-24 LAB — METABOLIC PANEL, COMPREHENSIVE
A-G Ratio: 1.1 (ref 1.1–2.2)
ALT (SGPT): 66 U/L (ref 12–78)
AST (SGOT): 50 U/L — ABNORMAL HIGH (ref 15–37)
Albumin: 3.9 g/dL (ref 3.5–5.0)
Alk. phosphatase: 90 U/L (ref 50–136)
Anion gap: 11 mmol/L (ref 5–15)
BUN/Creatinine ratio: 14 (ref 12–20)
BUN: 14 MG/DL (ref 6–20)
Bilirubin, total: 0.5 MG/DL (ref 0.2–1.0)
CO2: 26 MMOL/L (ref 21–32)
Calcium: 8.9 MG/DL (ref 8.5–10.1)
Chloride: 100 MMOL/L (ref 97–108)
Creatinine: 1 MG/DL (ref 0.6–1.3)
GFR est AA: >60 mL/min/{1.73_m2} (ref >60)
GFR est non-AA: >60 mL/min/{1.73_m2} (ref >60)
Globulin: 3.7 g/dL (ref 2.0–4.0)
Glucose: 96 MG/DL (ref 65–100)
Potassium: 3.9 MMOL/L (ref 3.5–5.1)
Protein, total: 7.6 g/dL (ref 6.4–8.2)
Sodium: 137 MMOL/L (ref 136–145)

## 2009-09-24 LAB — CBC WITH AUTOMATED DIFF
ABS. BASOPHILS: 0 10*3/uL (ref 0.0–0.1)
ABS. EOSINOPHILS: 0.1 10*3/uL (ref 0.0–0.4)
ABS. LYMPHOCYTES: 2.1 10*3/uL (ref 0.8–3.5)
ABS. MONOCYTES: 0.3 10*3/uL (ref 0.0–1.0)
ABS. NEUTROPHILS: 3.2 10*3/uL (ref 1.8–8.0)
BASOPHILS: 0 % (ref 0–1)
EOSINOPHILS: 2 % (ref 0–7)
HCT: 33.5 % — ABNORMAL LOW (ref 35.0–47.0)
HGB: 11.2 g/dL — ABNORMAL LOW (ref 11.5–16.0)
LYMPHOCYTES: 36 % (ref 12–49)
MCH: 28.9 PG (ref 26.0–34.0)
MCHC: 33.4 g/dL (ref 30.0–36.5)
MCV: 86.6 FL (ref 80.0–99.0)
MONOCYTES: 6 % (ref 5–13)
NEUTROPHILS: 56 % (ref 32–75)
PLATELET: 203 10*3/uL (ref 150–400)
RBC: 3.87 M/uL (ref 3.80–5.20)
RDW: 13.2 % (ref 11.5–14.5)
WBC: 5.7 10*3/uL (ref 3.6–11.0)

## 2009-09-24 LAB — URINALYSIS W/ REFLEX CULTURE
Bacteria: NEGATIVE /HPF
Bilirubin: NEGATIVE
Blood: NEGATIVE
Glucose: NEGATIVE MG/DL
Ketone: NEGATIVE MG/DL
Leukocyte Esterase: NEGATIVE
Nitrites: NEGATIVE
Protein: NEGATIVE MG/DL
Specific gravity: 1.012 (ref 1.003–1.030)
Urobilinogen: 0.2 EU/DL (ref 0.2–1.0)
pH (UA): 6 (ref 5.0–8.0)

## 2009-09-24 LAB — CK W/ CKMB & INDEX
CK - MB: <0.5 NG/ML — ABNORMAL LOW (ref 0.5–3.6)
CK: 100 U/L (ref 26–192)

## 2009-09-24 LAB — HCG URINE, QL: HCG urine, QL: NEGATIVE

## 2009-09-24 LAB — LIPASE: Lipase: 102 U/L (ref 73–393)

## 2009-09-24 LAB — TROPONIN I: Troponin-I, Qt.: <0.04 ng/mL (ref <0.05)

## 2009-09-24 MED ORDER — OMEPRAZOLE 20 MG CAP, DELAYED RELEASE
20 mg | ORAL_CAPSULE | Freq: Every day | ORAL | Status: DC
Start: 2009-09-24 — End: 2010-02-08

## 2009-09-24 MED ADMIN — maalox/donnatal/viscous lidocaine (GI COCKTAIL): ORAL | @ 18:00:00 | NDC 09999151405

## 2009-09-24 NOTE — ED Notes (Signed)
I have reviewed discharge instructions with the patient.  The patient verbalized understanding. No additional questions on discharge. Pt leaves ambulatory in no apparent distress.

## 2009-09-24 NOTE — ED Notes (Signed)
Pt resting in bed in position of comfort with family at bedside. Pt is awake, alert, and oriented x 4, airway patent, skin w &d, respirations non labored and regular. Pt aware pending results for disposition.

## 2009-09-24 NOTE — ED Provider Notes (Signed)
HPI Comments: 39 y.o. female presents to ED C/O LUQ pain x 6 days. Pt states LUQ pain radiates to L flank. Pt states she has experienced similar pain with pyelonephritis in the past. Pt also notes epigastric burning which she associates with her GERD. Pt reports nausea. Pt states she had diarrhea 6 days ago but states that relieved and she has not had a BM since then which she states is normal for her. Pt states she was seen in the ED at [facility] 4 days ago and had a CT and UA which were normal. Pt denies vomiting, dysuria, hematochezia, vaginal discharge.    PCP: Loura Pardon, MD   PMHx: GERD, HTN  PSHx: tubal ligation, hernia  Social Hx: -tobacco, +etoh occasionally  ALL: Compazine (Prochlorperazine Edisylate), Dilaudid (Hydromorphone (Pf)), Percocet (Oxycodone-acetaminophen), Prednisone    There are no other complaints, changes or physical findings at this time.    Written by Joan Flores, ED Scribe, as dictated by Debbe Bales, MD.     The history is provided by the patient.        Past Medical History   Diagnosis Date   ??? GERD (gastroesophageal reflux disease)    ??? Hypertension    ??? Pap smear July 29, 2007          Past Surgical History   Procedure Date   ??? Hx tubal ligation 2004   ??? Ir dilation nephrostomy ureter 2001   ??? Hx hernia repair 1998           Family History   Problem Relation Age of Onset   ??? Hypertension Mother    ??? Hypertension Father           History   Social History   ??? Marital Status: Single     Spouse Name: N/A     Number of Children: N/A   ??? Years of Education: N/A   Occupational History   ??? Not on file.   Social History Main Topics   ??? Smoking status: Never Smoker    ??? Smokeless tobacco: Never Used   ??? Alcohol Use: 1.5 oz/week     3 Glasses of wine per week   ??? Drug Use: No   ??? Sexually Active: Yes -- Female partner(s)   Other Topics Concern   ??? Not on file   Social History Narrative   ??? No narrative on file                     ALLERGIES: Compazine, Dilaudid, Percocet and Prednisone      Review of Systems   Constitutional: Negative.    HENT: Negative.    Eyes: Negative.    Respiratory: Negative.    Cardiovascular: Negative.    Gastrointestinal: Positive for nausea and abdominal pain. Negative for vomiting and blood in stool.   Genitourinary: Negative.  Negative for dysuria and vaginal bleeding.   Musculoskeletal: Negative.    Skin: Negative.    Neurological: Negative.    All other systems reviewed and are negative.        Filed Vitals:    09/24/09 1128   BP: 142/89   Pulse: 95   Temp: 98.6 ??F (37 ??C)   Resp: 18   Height: 5\' 4"  (1.626 m)   Weight: 115 lb 11.9 oz (52.5 kg)   SpO2: 100%              Physical Exam   Physical Examination: General appearance -  Well appearing in no apparent distress  Eyes - pupils equal and reactive, extraocular eye movements intact  Mouth - mucous membranes moist, pharynx normal without lesions  Neck - supple, no significant adenopathy  Chest - Normal respiratory effort, clear to auscultation bilaterally  Heart - normal rate and regular rhythm, S1 and S2 normal, no murmurs noted  Abdomen - soft, mild LUQ tenderness nondistended, no peritoneal signs  Neurological - alert, oriented, normal speech, cranial nerves intact, no focal motor findings  Extremities - peripheral pulses normal, no pedal edema  Skin - normal coloration and turgor, no rash  MDM    Procedures    Answered pt's questions and discussed care plan. 12:55 PM  Written by Joan Flores, ED Scribe, as dictated by Debbe Bales, MD.     1:36 PM   Pt instructed to F/U with GI, pt states she is followed by Dr. Cira Rue for her GERD. The patient's results have been reviewed with them.  Patient and/or family, verbally conveyed their understanding and agreement of the patient's signs, symptoms, diagnosis, treatment and prognosis and additionally agree to follow up as recommended or return to the Emergency Room should their condition change prior to their follow-up appointment. The patient verbally agrees with the care-plan and verbally conveys that all of their questions have been answered.   The discharge instructions have also been provided to the patient with some educational information regarding their diagnosis as well a list of reasons why they would want to return to the ER prior to their follow-up appointment, should their condition change.     Written by Joan Flores, ED Scribe, as dictated by Debbe Bales, MD.     EKG interpretation: (Preliminary)  Rhythm: normal sinus rhythm; and regular . Rate (approx.): 83; Axis: normal; P wave: normal; QRS interval: normal ; ST/T wave: normal;

## 2009-09-25 LAB — EKG, 12 LEAD, INITIAL
Atrial Rate: 83 {beats}/min
Calculated P Axis: 80 degrees
Calculated R Axis: 42 degrees
Calculated T Axis: 38 degrees
Diagnosis: NORMAL
P-R Interval: 152 ms
Q-T Interval: 364 ms
QRS Duration: 72 ms
QTC Calculation (Bezet): 427 ms
Ventricular Rate: 83 {beats}/min

## 2009-09-27 NOTE — Telephone Encounter (Signed)
Called patient and she stated she had called Dr Teressa Lower, who she has seen before, for a f/u appt and possible Endoscopy. She is still having intermittant GI pain, the Prevacid is not working, per patient, so she went back to the Zantac (2x/day) and that is helping her. She is still having some associated Nausea, and taking the phenergan. Medication Reconciliation done with the patient. She states her Blood pressure is doing "okay". Patient has f/u 10/14/09 with Dr Teressa Lower and does not feel she needs an appt here at this time.

## 2010-02-08 MED ORDER — AMLODIPINE 5 MG TAB
5 mg | ORAL_TABLET | Freq: Every day | ORAL | Status: DC
Start: 2010-02-08 — End: 2010-03-21

## 2010-02-08 MED ORDER — HYDROCODONE-ACETAMINOPHEN 2.5 MG-500 MG TAB
ORAL_TABLET | Freq: Four times a day (QID) | ORAL | Status: DC | PRN
Start: 2010-02-08 — End: 2010-08-25

## 2010-02-08 NOTE — Patient Instructions (Signed)
High Blood Pressure: After Your Visit  Your Care Instructions  If your blood pressure is usually above 140/90, you have high blood pressure, or hypertension. Despite what a lot of people think, high blood pressure usually does not cause headaches or make you feel dizzy or lightheaded. It usually has no symptoms. But it does increase your risk for heart attack, stroke, and kidney or eye damage. The higher your blood pressure, the more your risk increases.  Your doctor will give you a goal for your blood pressure. This goal may be below 140/90. Or it may be even lower if you have other health problems, such as diabetes, heart failure, or coronary artery disease.  Changes in your lifestyle, such as staying at a healthy weight, may help you lower your blood pressure. Your treatment also will include medicine. If you stop taking your medicine, your blood pressure will go back up.  Follow-up care is a key part of your treatment and safety. Be sure to make and go to all appointments, and call your doctor if you are having problems. It???s also a good idea to know your test results and keep a list of the medicines you take.  How can you care for yourself at home?  Medical treatment  ?? Take your medicine exactly as prescribed. You may take one or more types of medicine to lower your blood pressure. They include diuretics, beta-blockers, ACE inhibitors, calcium channel blockers, angiotensin II receptor blockers, and other medicines. Call your doctor if you think you are having a problem with your medicine.   ?? Your doctor may suggest that you take one low-dose aspirin (81 mg) a day. This can help reduce your risk of having a stroke or heart attack.   ?? See your doctor at least 2 times a year. You may need to see the doctor more often at first or until your blood pressure comes down.    ?? If you are taking blood pressure medicine, talk to your doctor before you take decongestants or anti-inflammatory medicine, such as ibuprofen. Some of these medicines can raise blood pressure.   ?? Learn how to check your blood pressure at home.  Lifestyle changes  ?? Stay at a healthy weight. This is especially important if you put on weight around the waist. Losing even 10 pounds can help you lower your blood pressure.   ?? If your doctor recommends it, get more exercise. Walking is a good choice. Bit by bit, increase the amount you walk every day. Try for at least 30 minutes on most days of the week. You also may want to swim, bike, or do other activities.   ?? Avoid or limit alcohol. Talk to your doctor about whether you can drink any alcohol.   ?? Limit salt.   ?? Eat plenty of fruits (such as bananas and oranges), vegetables, legumes, whole grains, and low-fat dairy products.   ?? Lower the amount of saturated fat in your diet. Saturated fat is found in animal products such as milk, cheese, and meat. Limiting these foods may help you lose weight and also lower your risk for heart disease.   ?? Do not smoke. Smoking increases your risk for heart attack and stroke. If you need help quitting, talk to your doctor about stop-smoking programs and medicines. These can increase your chances of quitting for good.  When should you call for help?  Call 911 anytime you think you may need emergency care. For example, call if:  ??   You have symptoms of a heart attack. These may include:   ?? Chest pain or pressure, or a strange feeling in the chest.   ?? Sweating.   ?? Shortness of breath.   ?? Nausea and vomiting   ?? Pain, pressure, or a strange feeling in the back, neck, jaw, or upper belly or in one or both shoulders or arms.   ?? Lightheadedness or sudden weakness.   ?? A fast or irregular heartbeat.  After calling 911, chew 1 adult-strength aspirin. Wait for an ambulance. Do not try to drive yourself.    ?? You have signs of a stroke. These include:   ?? Sudden numbness, paralysis, or weakness in your face, arm, or leg, especially on only one side of your body.   ?? New problems with walking or balance.   ?? Sudden vision changes.   ?? Drooling or slurred speech.   ?? New problems speaking or understanding simple statements, or feeling confused.   ?? A sudden, severe headache that is different from past headaches.  Call your doctor now or seek immediate medical care if:  ?? Your blood pressure is much higher than normal (such as 180/110 or higher).   ?? You think high blood pressure is causing symptoms such as:   ?? Severe headache.   ?? Blurry vision.   ?? Nausea or vomiting.  Watch closely for changes in your health, and be sure to contact your doctor if:  ?? You do not get better as expected.     Where can you learn more?   Go to http://[email or web address].net/[email or web address]  Enter X567 in the search box to learn more about "High Blood Pressure: After Your Visit."   ?? 2006-2011 [employer]. Care instructions adapted under license by [employer] (which disclaims liability or warranty for this information). This care instruction is for use with your licensed healthcare professional. If you have questions about a medical condition or this instruction, always ask your healthcare professional. Healthwise, Incorporated disclaims any warranty or liability for your use of this information.  Content Version: 9.0.56994; Last Revised: June 22, 2009

## 2010-02-08 NOTE — Progress Notes (Signed)
Vanessa Kramer is a 39 y.o. female with the following history as recorded in [facility]:  Patient Active Problem List   Diagnoses Date Noted   ??? Dizziness [780.4A] 06/24/2009   ??? HTN (hypertension) [401.9AF] 06/24/2009   ??? Vertigo [780.4K] 06/24/2009   ??? Pharyngitis [462Y] 06/24/2009       Current outpatient prescriptions   Medication Sig Dispense Refill   ??? amLODIPine (NORVASC) 5 mg tablet Take 1 Tab by mouth daily.  30 Tab  6   ??? HYDROcodone-acetaminophen (LORTAB) 2.5-500 mg per tablet Take 1 Tab by mouth every six (6) hours as needed for Pain.  20 Tab  0   ??? RANITIDINE HCL (ZANTAC PO) Take  by mouth two (2) times daily as needed.       ??? dicyclomine (BENTYL) 10 mg capsule Take 10 mg by mouth 4 times daily (before meals and nightly).       ??? promethazine (PHENERGAN) 25 mg tablet Take 1 Tab by mouth every six (6) hours as needed for Nausea.  30 Tab  1       Allergies: Compazine, Dilaudid, Percocet and Prednisone  Past Medical History   Diagnosis Date   ??? GERD (gastroesophageal reflux disease)    ??? Hypertension    ??? Pap smear July 29, 2007       Past Surgical History   Procedure Date   ??? Hx tubal ligation 2004   ??? Ir dilation nephrostomy ureter 2001   ??? Hx hernia repair 1998       Family History   Problem Relation Age of Onset   ??? Hypertension Mother    ??? Hypertension Father        History   Substance Use Topics   ??? Smoking status: Never Smoker    ??? Smokeless tobacco: Never Used   ??? Alcohol Use: 1.5 oz/week     3 Glasses of wine per week       Patient presents today with a few concerns.      1. She has some tenderness to the left side of her chest wall, mainly around the clavicle and shoulder area, and some heaviness and tingling in her left arm.  This began when she woke today.  She works night shift so her waking was really yesterday evening, and she had some discomfort during the day.  She presents today for evaluation of that.   2. Her other concern is that she has been having headaches off and on, but she does admit that she has been off of her blood pressure medicine for more than a month, and previous to that taking it only sporadically.  She felt as if her  blood pressure had improved because the few times she checked it, it was within normal limits.      Objective:  VITAL SIGNS:  Blood pressure is elevated as noted.  CARDIOVASCULAR:  Normal sinus rhythm without murmurs, gallops or rubs.  MUSCULOSKELETAL:  The patient has some tenderness to palpation of the anterior chest wall and the supraclavicular region of the left shoulder.  She is 5/5 strength and reflexes are intact.      Assessment and Plan:    1. Chest wall strain.  Talked to patient about this being most likely related to her sleep position.  Patient agrees that this started on waking.  This should resolve.  Since she is having some pretty significant discomfort, I  will give her a short course of 2.5 mg Lortab #20  for p.r.n. use.  Advised ice to the area as well as range of motion exercises and avoidance of sleeping in the same position which can exacerbate it.  2. Uncontrolled hypertension.  Had a long conversation with the patient about medication compliance and need to take her medication.  She really could not give a good reason why she would not take her medicines, said that she would go pick up a new dose.  I am going to increase her from 2.5 to 5 mg of Norvasc and will take that daily, and will follow up with Dr. Maple Hudson in four weeks.    MedDATA/lmm

## 2010-02-09 NOTE — Telephone Encounter (Signed)
Patient called and stated that she came in yesterday and saw Dr. [name].  Patient stated that she was having chest pain and having headaches, patient stated that her blood pressure medication was increased but she is still having headaches.  Patient stated that she is still having headaches, patient rated pain 5/10 on pain scale.  Patient stated that she was prescribed Lortab, which did not give her any relief so she is now taking excedrin

## 2010-03-21 MED ORDER — AMLODIPINE-BENAZEPRIL 5 MG-10 MG CAP
5-10 mg | ORAL_CAPSULE | Freq: Every day | ORAL | Status: DC
Start: 2010-03-21 — End: 2011-02-20

## 2010-03-21 MED ORDER — NAPROXEN 500 MG TAB
500 mg | ORAL_TABLET | Freq: Two times a day (BID) | ORAL | Status: DC
Start: 2010-03-21 — End: 2010-08-25

## 2010-03-21 NOTE — Patient Instructions (Signed)
MyChart Activation    Thank you for requesting access to MyChart. Please follow the instructions below to securely access and download your online medical record. MyChart allows you to send messages to your doctor, view your test results, renew your prescriptions, schedule appointments, and more.    How Do I Sign Up?    1. In your internet browser, go to [email or web address].  2. Click on the First Time User? Click Here link in the Sign In box. You will see the New Member Sign Up page.  3. Enter your MyChart Access Code exactly as it appears below. You will not need to use this code after you???ve completed the sign-up process. If you do not sign up before the expiration date, you must request a new code.    MyChart Access Code: Not generated  Current MyChart Status: Active (This is the date your MyChart access code will expire)    4. Enter the last four digits of your Social Security Number (xxxx) and Date of Birth (mm/dd/yyyy) as indicated and click Submit. You will be taken to the next sign-up page.  5. Create a MyChart ID. This will be your MyChart login ID and cannot be changed, so think of one that is secure and easy to remember.  6. Create a MyChart password. You can change your password at any time.  7. Enter your Password Reset Question and Answer. This can be used at a later time if you forget your password.   8. Enter your e-mail address. You will receive e-mail notification when new information is available in MyChart.  9. Click Sign Up. You can now view and download portions of your medical record.  10. Click the Download Summary menu link to download a portable copy of your medical information.    Additional Information    If you have questions, please call [phone]. Remember, MyChart is NOT to be used for urgent needs. For medical emergencies, dial 911.
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
## 2010-08-25 LAB — METABOLIC PANEL, COMPREHENSIVE
A-G Ratio: 1.1 (ref 1.1–2.2)
ALT (SGPT): 42 U/L (ref 12–78)
AST (SGOT): 19 U/L (ref 15–37)
Albumin: 3.8 g/dL (ref 3.5–5.0)
Alk. phosphatase: 88 U/L (ref 50–136)
Anion gap: 6 mmol/L (ref 5–15)
BUN/Creatinine ratio: 14 (ref 12–20)
BUN: 13 MG/DL (ref 6–20)
Bilirubin, total: 0.5 MG/DL (ref 0.2–1.0)
CO2: 29 MMOL/L (ref 21–32)
Calcium: 8.8 MG/DL (ref 8.5–10.1)
Chloride: 103 MMOL/L (ref 97–108)
Creatinine: 0.9 MG/DL (ref 0.6–1.3)
GFR est AA: >60 mL/min/{1.73_m2} (ref >60)
GFR est non-AA: >60 mL/min/{1.73_m2} (ref >60)
Globulin: 3.6 g/dL (ref 2.0–4.0)
Glucose: 95 MG/DL (ref 65–100)
Potassium: 3.8 MMOL/L (ref 3.5–5.1)
Protein, total: 7.4 g/dL (ref 6.4–8.2)
Sodium: 138 MMOL/L (ref 136–145)

## 2010-08-25 LAB — URINALYSIS W/MICROSCOPIC
Bacteria: NEGATIVE /HPF
Bilirubin: NEGATIVE
Blood: NEGATIVE
Glucose: NEGATIVE MG/DL
Ketone: NEGATIVE MG/DL
Leukocyte Esterase: NEGATIVE
Nitrites: NEGATIVE
Protein: NEGATIVE MG/DL
Specific gravity: 1.024 (ref 1.003–1.030)
Urobilinogen: 0.2 EU/DL (ref 0.2–1.0)
pH (UA): 6 (ref 5.0–8.0)

## 2010-08-25 LAB — CBC WITH AUTOMATED DIFF
ABS. BASOPHILS: 0 10*3/uL (ref 0.0–0.1)
ABS. EOSINOPHILS: 0.1 10*3/uL (ref 0.0–0.4)
ABS. LYMPHOCYTES: 2.1 10*3/uL (ref 0.8–3.5)
ABS. MONOCYTES: 0.4 10*3/uL (ref 0.0–1.0)
ABS. NEUTROPHILS: 2.3 10*3/uL (ref 1.8–8.0)
BASOPHILS: 0 % (ref 0–1)
EOSINOPHILS: 2 % (ref 0–7)
HCT: 31.6 % — ABNORMAL LOW (ref 35.0–47.0)
HGB: 10.6 g/dL — ABNORMAL LOW (ref 11.5–16.0)
LYMPHOCYTES: 42 % (ref 12–49)
MCH: 28.6 PG (ref 26.0–34.0)
MCHC: 33.5 g/dL (ref 30.0–36.5)
MCV: 85.4 FL (ref 80.0–99.0)
MONOCYTES: 8 % (ref 5–13)
NEUTROPHILS: 48 % (ref 32–75)
PLATELET: 247 10*3/uL (ref 150–400)
RBC: 3.7 M/uL — ABNORMAL LOW (ref 3.80–5.20)
RDW: 13.9 % (ref 11.5–14.5)
WBC: 4.9 10*3/uL (ref 3.6–11.0)

## 2010-08-25 LAB — TROPONIN I: Troponin-I, Qt.: <0.04 ng/mL (ref <0.05)

## 2010-08-25 LAB — CK W/ CKMB & INDEX
CK - MB: <0.5 NG/ML — ABNORMAL LOW (ref 0.5–3.6)
CK: 96 U/L (ref 26–192)

## 2010-08-25 LAB — LIPASE: Lipase: 108 U/L (ref 73–393)

## 2010-08-25 LAB — HCG URINE, QL: HCG urine, QL: NEGATIVE

## 2010-08-25 MED ORDER — ONDANSETRON (PF) 4 MG/2 ML INJECTION
4 mg/2 mL | Freq: Once | INTRAMUSCULAR | Status: AC
Start: 2010-08-25 — End: 2010-08-25
  Administered 2010-08-25: 21:00:00 via INTRAVENOUS

## 2010-08-25 MED ORDER — MORPHINE 4 MG/ML SYRINGE
4 mg/mL | Freq: Once | INTRAMUSCULAR | Status: AC
Start: 2010-08-25 — End: 2010-08-25
  Administered 2010-08-25: 21:00:00 via INTRAVENOUS

## 2010-08-25 MED ORDER — PANTOPRAZOLE 40 MG IV SOLR
40 mg | INTRAVENOUS | Status: AC
Start: 2010-08-25 — End: 2010-08-25
  Administered 2010-08-25: 21:00:00 via INTRAVENOUS

## 2010-08-25 MED ORDER — ONDANSETRON (PF) 4 MG/2 ML INJECTION
4 mg/2 mL | Freq: Once | INTRAMUSCULAR | Status: AC
Start: 2010-08-25 — End: 2010-08-25
  Administered 2010-08-25: 23:00:00 via INTRAVENOUS

## 2010-08-25 MED ORDER — LIDOCAINE 2 % MUCOSAL SOLN
2 % | Status: AC
Start: 2010-08-25 — End: 2010-08-25
  Administered 2010-08-25: 20:00:00 via ORAL

## 2010-08-25 MED ORDER — MORPHINE 4 MG/ML SYRINGE
4 mg/mL | Freq: Once | INTRAMUSCULAR | Status: AC
Start: 2010-08-25 — End: 2010-08-25
  Administered 2010-08-25: 23:00:00 via INTRAVENOUS

## 2010-08-25 MED ORDER — HYDROCODONE-ACETAMINOPHEN 5 MG-325 MG TAB
5-325 mg | ORAL | Status: DC
Start: 2010-08-25 — End: 2010-08-25

## 2010-08-25 MED ORDER — OXYCODONE-ACETAMINOPHEN 5 MG-325 MG TAB
5-325 mg | ORAL_TABLET | ORAL | Status: DC | PRN
Start: 2010-08-25 — End: 2010-08-25

## 2010-08-25 MED ORDER — HYDROCODONE-ACETAMINOPHEN 5 MG-325 MG TAB
5-325 mg | ORAL_TABLET | ORAL | Status: DC | PRN
Start: 2010-08-25 — End: 2011-01-23

## 2010-08-25 MED ADMIN — sodium chloride (NS) 0.9 % flush: @ 21:00:00 | NDC 87701099893

## 2010-08-25 NOTE — ED Notes (Signed)
Pt states she is feeling much better and is ready to go home.

## 2010-08-25 NOTE — ED Notes (Signed)
Dr. Ermalinda Memos reviewed discharge instructions with the patient.  The patient verbalized understanding.  The patient is discharged ambulatory in the care of family with instructions and prescriptions in hand.  The patient is discharged ambulatory in the care of family with instructions and prescriptions in hand.

## 2010-08-25 NOTE — ED Notes (Signed)
Pt states that since Tuesday she has had LUQ abd pain.  + nausea and + loose stool.  States that her abd is tender and her stomach is making a lot of noise.  Has not taken her nausea medication since Tuesday.  Pt sitting on stretcher reading book.  No acute distress.

## 2010-08-25 NOTE — ED Provider Notes (Signed)
HPI Comments: Vanessa Kramer is a 40 yo female presenting ambulatory to ED with c/o "burning" 8/10 abd pain x 3 days with worsening x today. Pt reports associated nausea, diarrhea, and dizziness. Pt reports hx of similar pain but notes it is more severe at this time. Pt states she had an endoscopy in the past and was dx with esophagitis. Pt reports LMP x 08/19/2010. Patient specifically denies any fever, chills, lightheadedness, headache, sore throat, chest pain, shortness of breath, vomiting, problems urinating, vaginal discharge, vaginal bleeding, syncope, muscle or joint aches, rashes or bruises.     PMHx significant for GERD, HTN    ZOX:WRUEAV H Young  GI: [facility]    There are no other complaints, changes or physical findings at this time. 3:47 PM.    Written by Peri Maris, ED Scribe, as dictated by Marlan Palau, MD.          The history is provided by the patient. No language interpreter was used.        Past Medical History   Diagnosis Date   ??? GERD (gastroesophageal reflux disease)    ??? Hypertension    ??? Pap smear July 29, 2007        Past Surgical History   Procedure Date   ??? Hx hernia repair 1998   ??? Hx tubal ligation 2004   ??? Hx gyn      D&C         Family History   Problem Relation Age of Onset   ??? Hypertension Mother    ??? Hypertension Father         History     Social History   ??? Marital Status: Single     Spouse Name: N/A     Number of Children: N/A   ??? Years of Education: N/A     Occupational History   ??? Not on file.     Social History Main Topics   ??? Smoking status: Never Smoker    ??? Smokeless tobacco: Never Used   ??? Alcohol Use: 1.5 oz/week     3 Glasses of wine per week   ??? Drug Use: No   ??? Sexually Active: Yes -- Female partner(s)     Other Topics Concern   ??? Not on file     Social History Narrative   ??? No narrative on file                  ALLERGIES: Compazine; Dilaudid; Percocet; and Prednisone      Review of Systems   Constitutional: Negative.  Negative for fever and chills.    HENT: Negative.  Negative for sore throat.    Eyes: Negative.    Respiratory: Negative.  Negative for shortness of breath.    Cardiovascular: Negative.  Negative for chest pain.   Gastrointestinal: Positive for nausea, abdominal pain and diarrhea. Negative for vomiting.   Genitourinary: Negative.  Negative for dysuria, vaginal bleeding, vaginal discharge and difficulty urinating.   Musculoskeletal: Negative.  Negative for myalgias and arthralgias.   Skin: Negative.  Negative for rash.   Neurological: Positive for dizziness. Negative for syncope, light-headedness and headaches.   Hematological: Does not bruise/bleed easily.   All other systems reviewed and are negative.        Filed Vitals:    08/25/10 1357 08/25/10 1710 08/25/10 2025   BP: 134/95 113/87 133/90   Pulse: 86 75 69   Temp: 98 ??F (36.7 ??C)  Resp: 16  16   Height: 5\' 4"  (1.626 m)     Weight: 69.9 kg (154 lb 1.6 oz)     SpO2: 100% 99% 100%            Physical Exam   Nursing note and vitals reviewed.   Physical Examination: General appearance - nontoxic appearance  Eyes - extraocular eye movements intact  Mouth - mucous membranes moist, pharynx normal without lesions  Neck - Supple, normal range of motion  Chest - Normal respiratory effort,clear to auscultation bilaterally  Heart - normal rate and regular rhythm, S1 and S2 normal, no murmurs noted  Abdomen - soft, epigastric ttp, nondistended, no peritoneal signs  Neurological - alert, oriented, normal speech, no focal motor findings  Extremities - peripheral pulses normal, no pedal edema  Skin - normal coloration and turgor, no rash        MDM     Differential Diagnosis; Clinical Impression; Plan:     Gastritis, gerd, sbo, uti  Amount and/or Complexity of Data Reviewed:   Clinical lab tests:  Ordered and reviewed    Labs Reviewed   CBC WITH AUTOMATED DIFF - Abnormal; Notable for the following:     RBC 3.70 (*)     HGB 10.6 (*)     HCT 31.6 (*)     All other components within normal limits    CK W/ CKMB & INDEX - Abnormal; Notable for the following:     CK - MB <0.5 (*)     All other components within normal limits   URINALYSIS W/MICROSCOPIC   METABOLIC PANEL, COMPREHENSIVE   LIPASE   HCG URINE, QL   TROPONIN I       EKG interpretation: (Preliminary)  Rhythm: normal sinus rhythm; and regular . Rate (approx.): 74; Axis: normal; P wave: ; QRS interval: normal ; ST/T wave: non-specific changes; in  Lead: ; Other findings: motion artifact.    Procedures    5:59 PM  Patient complains of continued pain in abdomen- that pain medication only works briefly. She has epigastric ttp. Will CT abdomen, to evaluate any complication of NISSEN procedure. Marlan Palau, MD    Addendum: Patient signed out to Dr. Ermalinda Memos at end of shift. He discharged patient. Marlan Palau, MD

## 2010-08-26 LAB — EKG, 12 LEAD, INITIAL
Atrial Rate: 74 {beats}/min
Calculated P Axis: 74 degrees
Calculated R Axis: 61 degrees
Calculated T Axis: 67 degrees
Diagnosis: NORMAL
P-R Interval: 152 ms
Q-T Interval: 370 ms
QRS Duration: 70 ms
QTC Calculation (Bezet): 410 ms
Ventricular Rate: 74 {beats}/min

## 2011-01-23 MED ORDER — IBUPROFEN 800 MG TAB
800 mg | ORAL_TABLET | Freq: Four times a day (QID) | ORAL | Status: DC | PRN
Start: 2011-01-23 — End: 2011-04-24

## 2011-01-23 NOTE — Progress Notes (Signed)
Subjective:  The patient presents today with two concerns.  One was elevated blood pressure and the second was a headache.  She has had a bad headache for the past several days.  When she checked her blood pressure yesterday it was elevated at 140's/100's.  This morning, her blood pressure has returned to normal but she continues to have a headache.  It is a throbbing headache, bilateral temporal area that radiates to the back of her neck.  She has tried Tylenol and Excedrin and they really have not relieved her headache.  She continues to take her blood pressure medication on a daily basis but she has not been in for routine followup of her blood pressure since January.    Objective:  Vital signs stable.  GENERAL:  Well-developed, well-nourished female in no apparent distress.  HEENT:  Unremarkable.  CARDIOVASCULAR:  Normal sinus rhythm.    Assessment and Plan:   1.   Hypertension well-controlled.  No change in current medications.  The patient does need a visit for labs and medication refills.  2.   Tension headache.  Ibuprofen 800 mg p.r.n.  The patient has Phenergan at home, which is prescribed by her GI doctor.  Advised her to take lunch, take one Motrin, take one Phenergan and lay down and rest.  If she continues to have the headache, repeat the dosing at bedtime and her headache should resolve by then.  Otherwise, the patient is going to followup in four weeks for a routine blood pressure followup visit.    MedDATA/leh

## 2011-02-20 MED ORDER — AMLODIPINE-BENAZEPRIL 5 MG-10 MG CAP
5-10 mg | ORAL_CAPSULE | Freq: Every day | ORAL | Status: DC
Start: 2011-02-20 — End: 2011-09-15

## 2011-02-20 NOTE — Patient Instructions (Signed)
High Blood Pressure: After Your Visit  Your Care Instructions  If your blood pressure is usually above 140/90, you have high blood pressure, or hypertension. Despite what a lot of people think, high blood pressure usually doesn't cause headaches or make you feel dizzy or lightheaded. It usually has no symptoms. But it does increase your risk for heart attack, stroke, and kidney or eye damage. The higher your blood pressure, the more your risk increases.  Your doctor will give you a goal for your blood pressure. This goal may be below 140/90. Or it may be even lower if you have other health problems, such as diabetes, heart failure, or coronary artery disease.  Changes in your lifestyle, such as staying at a healthy weight, may help you lower your blood pressure. Your treatment also will include medicine. If you stop taking your medicine, your blood pressure will go back up.  Follow-up care is a key part of your treatment and safety. Be sure to make and go to all appointments, and call your doctor if you are having problems. It???s also a good idea to know your test results and keep a list of the medicines you take.  How can you care for yourself at home?  Medical treatment  ?? Take your medicine exactly as prescribed. You may take one or more types of medicine to lower your blood pressure. They include diuretics, beta-blockers, ACE inhibitors, calcium channel blockers, angiotensin II receptor blockers, and other medicines. Call your doctor if you think you are having a problem with your medicine.   ?? Your doctor may suggest that you take one low-dose aspirin (81 mg) a day. This can help reduce your risk of having a stroke or heart attack.   ?? See your doctor at least 2 times a year. You may need to see the doctor more often at first or until your blood pressure comes down.    ?? If you are taking blood pressure medicine, talk to your doctor before you take decongestants or anti-inflammatory medicine, such as ibuprofen. Some of these medicines can raise blood pressure.   ?? Learn how to check your blood pressure at home.   Lifestyle changes  ?? Stay at a healthy weight. This is especially important if you put on weight around the waist. Losing even 10 pounds can help you lower your blood pressure.   ?? If your doctor recommends it, get more exercise. Walking is a good choice. Bit by bit, increase the amount you walk every day. Try for at least 30 minutes on most days of the week. You also may want to swim, bike, or do other activities.   ?? Avoid or limit alcohol. Talk to your doctor about whether you can drink any alcohol.   ?? Limit salt.   ?? Eat plenty of fruits (such as bananas and oranges), vegetables, legumes, whole grains, and low-fat dairy products.   ?? Lower the amount of saturated fat in your diet. Saturated fat is found in animal products such as milk, cheese, and meat. Limiting these foods may help you lose weight and also lower your risk for heart disease.   ?? Do not smoke. Smoking increases your risk for heart attack and stroke. If you need help quitting, talk to your doctor about stop-smoking programs and medicines. These can increase your chances of quitting for good.   When should you call for help?  Call your doctor now or seek immediate medical care if:  ?? Your blood   pressure is much higher than normal (such as 180/110 or higher).   ?? You think high blood pressure is causing symptoms such as:   ?? Severe headache.   ?? Blurry vision.   ?? Nausea or vomiting.   Watch closely for changes in your health, and be sure to contact your doctor if:  ?? You do not get better as expected.     Where can you learn more?    Go to http://www.healthwise.net/[facility]   Enter X567 in the search box to learn more about "High Blood Pressure: After Your Visit."     ?? 2006-2012 [employer]. Care instructions adapted under license by [employer] (which disclaims liability or warranty for this information). This care instruction is for use with your licensed healthcare professional. If you have questions about a medical condition or this instruction, always ask your healthcare professional. Healthwise, Incorporated disclaims any warranty or liability for your use of this information.  Content Version: 9.4.94723; Last Revised: June 22, 2009

## 2011-02-20 NOTE — Progress Notes (Signed)
Subjective:      Vanessa Kramer is a 40 y.o. female   The patient presents today for followup on hypertension and migraine headaches.  When last I saw her, we tried Motrin 800 with Phenergan.  She said that that combination has helped her control her headaches well, but most importantly, taking the Motrin as soon as she feels as if the headache is coming has been crucial to controlling her headaches.  She is tolerating her blood pressure medication well.  She has no concerns today.  Denies any chest pain, headache or shortness of breath.    MedDATA/leh           Component Value Date/Time   Creatinine 0.9 08/25/2010  3:45 PM           Current Outpatient Prescriptions   Medication Sig Dispense Refill   ??? amLODIPine-benazepril (LOTREL) 5-10 mg per capsule Take 1 Cap by mouth daily.  30 Cap  6   ??? ibuprofen (MOTRIN) 800 mg tablet Take 1 Tab by mouth every six (6) hours as needed for Pain.  30 Tab  0   ??? RANITIDINE HCL (ZANTAC PO) Take  by mouth two (2) times daily as needed.       ??? dicyclomine (BENTYL) 10 mg capsule Take 10 mg by mouth 4 times daily (before meals and nightly).       ??? promethazine (PHENERGAN) 25 mg tablet Take 1 Tab by mouth every six (6) hours as needed for Nausea.  30 Tab  1      Allergies   Allergen Reactions   ??? Compazine (Prochlorperazine Edisylate) Other (comments)     Lock muscle   ??? Dilaudid (Hydromorphone (Pf)) Itching   ??? Percocet (Oxycodone-Acetaminophen) Itching   ??? Prednisone Swelling     Throat swelling     Past Medical History   Diagnosis Date   ??? GERD (gastroesophageal reflux disease)    ??? Hypertension    ??? Pap smear July 29, 2007      Past Surgical History   Procedure Date   ??? Hx hernia repair 1998   ??? Hx tubal ligation 2004   ??? Hx gyn      D&C      History     Social History   ??? Marital Status: Single     Spouse Name: N/A     Number of Children: N/A   ??? Years of Education: N/A     Occupational History   ??? Not on file.     Social History Main Topics   ??? Smoking status: Never Smoker     ??? Smokeless tobacco: Never Used   ??? Alcohol Use: 1.5 oz/week     3 Glasses of wine per week      occ   ??? Drug Use: No   ??? Sexually Active: Yes -- Female partner(s)     Other Topics Concern   ??? Not on file     Social History Narrative   ??? No narrative on file      Family History   Problem Relation Age of Onset   ??? Hypertension Mother    ??? Hypertension Father       BP 127/81   Pulse 83   Temp(Src) 97.3 ??F (36.3 ??C) (Oral)   Resp 18   Ht 5\' 4"  (1.626 m)   Wt 114 lb 2 oz (51.767 kg)   BMI 19.59 kg/m2   SpO2 99%   LMP 02/08/2011  Review of Systems:   Pertinent items are noted in HPI.    Physical Exam:   BP 127/81   Pulse 83   Temp(Src) 97.3 ??F (36.3 ??C) (Oral)   Resp 18   Ht 5\' 4"  (1.626 m)   Wt 114 lb 2 oz (51.767 kg)   BMI 19.59 kg/m2   SpO2 99%   LMP 02/08/2011  General appearance: alert, cooperative, no distress, appears stated age  Lungs: clear to auscultation bilaterally  Heart: regular rate and rhythm, S1, S2 normal, no murmur, click, rub or gallop    Assessment and Plan:     1. HTN (hypertension)  LIPID PANEL, METABOLIC PANEL, COMPREHENSIVE   2. Migraine       The patient's blood pressure is well-controlled.  We will continue her current regimen.  Will check lipid panel and metabolic panel today.  Her labs were within normal limits six months ago.  As far as her migraine headaches go, they are now well-controlled with the use if Motrin 800 and Phenergan 25 mg.  The patient understands to take the Motrin as soon as she feels like a headache is beginning.  She can hold on the Phenergan until she is home because it does make her drowsy.  She said that this has worked well to control her headaches and she is happy with the results.  Otherwise, she will followup in six months.    MedDATA/leh

## 2011-04-24 NOTE — Telephone Encounter (Signed)
Pt request refill

## 2011-04-26 MED ORDER — IBUPROFEN 800 MG TAB
800 mg | ORAL_TABLET | Freq: Four times a day (QID) | ORAL | Status: DC | PRN
Start: 2011-04-26 — End: 2014-05-08

## 2011-05-17 NOTE — Patient Instructions (Signed)
Musculoskeletal Chest Pain: After Your Visit  Your Care Instructions  Chest pain is not always a sign that something is wrong with your heart or that you have another serious problem. The doctor thinks your chest pain is caused by strained muscles or ligaments, inflamed chest cartilage, or another problem in your chest, rather than by your heart. You may need more tests to find the cause of your chest pain.  Follow-up care is a key part of your treatment and safety. Be sure to make and go to all appointments, and call your doctor if you are having problems. It???s also a good idea to know your test results and keep a list of the medicines you take.  How can you care for yourself at home?  ?? Take pain medicines exactly as directed.   ?? If the doctor gave you a prescription medicine for pain, take it as prescribed.   ?? If you are not taking a prescription pain medicine, ask your doctor if you can take an over-the-counter medicine.   ?? Rest and protect the sore area.   ?? Stop, change, or take a break from any activity that may be causing your pain or soreness.   ?? Put ice or a cold pack on the sore area for 10 to 20 minutes at a time. Try to do this every 1 to 2 hours for the next 3 days (when you are awake) or until the swelling goes down. Put a thin cloth between the ice and your skin.   ?? After 2 or 3 days, apply a heating pad set on low or a warm cloth to the area that hurts. Some doctors suggest that you go back and Vanessa Kramer between hot and cold.   ?? Do not wrap or tape your ribs for support. This may cause you to take smaller breaths, which could increase your risk of lung problems.   ?? Mentholated creams such as Bengay or Icy Hot may soothe sore muscles. Follow the instructions on the package.   ?? Follow your doctor's instructions for exercising.   ?? Gentle stretching and massage may help you get better faster. Stretch slowly to the point just before pain begins, and hold the stretch for at least 15 to 30 seconds.  Do this 3 or 4 times a day. Stretch just after you have applied heat.   ?? As your pain gets better, slowly return to your normal activities. Any increased pain may be a sign that you need to rest a while longer.   When should you call for help?  Call 911 anytime you think you may need emergency care. For example, call if:  ?? You have chest pain or pressure. This may occur with:   ?? Sweating.   ?? Shortness of breath.   ?? Nausea or vomiting.   ?? Pain that spreads from the chest to the neck, jaw, or one or both shoulders or arms.   ?? Dizziness or lightheadedness.   ?? A fast or uneven pulse.   After calling 911, chew 1 adult-strength aspirin. Wait for an ambulance. Do not try to drive yourself.  ?? You have sudden chest pain and shortness of breath, or you cough up blood.   Call your doctor now or seek immediate medical care if:  ?? You have any trouble breathing.   ?? Your chest pain gets worse.   ?? Your chest pain occurs consistently with exercise and is relieved by rest.   Watch closely   for changes in your health, and be sure to contact your doctor if:  ?? Your chest pain does not get better after 1 week.     Where can you learn more?    Go to http://www.healthwise.net/[facility]   Enter V293 in the search box to learn more about "Musculoskeletal Chest Pain: After Your Visit."    ?? 2006-2013 Healthwise, Incorporated. Care instructions adapted under license by [employer] (which disclaims liability or warranty for this information). This care instruction is for use with your licensed healthcare professional. If you have questions about a medical condition or this instruction, always ask your healthcare professional. Healthwise, Incorporated disclaims any warranty or liability for your use of this information.  Content Version: 9.6.101520; Last Revised: May 24, 2009

## 2011-05-17 NOTE — Progress Notes (Signed)
The patient states she has had several episodes of SOB since Christmas.  No hx of asthma of heart disease.  Vanessa Kramer,CMA

## 2011-07-02 NOTE — Progress Notes (Signed)
HISTORY OF PRESENT ILLNESS  Vanessa Kramer is a 41 y.o. female.  Chest Pain   The history is provided by the patient. This is a new problem. The problem has not changed since onset.The pain is associated with coughing, lifting arms and breathing. The pain is present in the lateral region, right side and left side. The pain is at a severity of 3/10. The pain is moderate. The quality of the pain is described as sharp. The pain does not radiate. The symptoms are aggravated by certain positions and deep breathing. Pertinent negatives include no abdominal pain, no diaphoresis, no shortness of breath and no vomiting. She has tried nothing for the symptoms. Her past medical history does not include aneurysm, DVT or PE.       Review of Systems   Constitutional: Negative for diaphoresis.   Respiratory: Negative for shortness of breath.    Cardiovascular: Positive for chest pain.   Gastrointestinal: Negative for vomiting and abdominal pain.   All other systems reviewed and are negative.        Physical Exam   Constitutional: She appears well-developed and well-nourished.   Cardiovascular: Normal rate and regular rhythm.    Pulmonary/Chest: Effort normal and breath sounds normal. No respiratory distress. She has no wheezes. She has no rales. She exhibits tenderness.       ASSESSMENT and PLAN  Vanessa Kramer was seen today for chest pain.    Diagnoses and associated orders for this visit:    Chest wall muscle strain      Reassured patient that treatment will lead to relief.  Hand out given for treatment and patient education.

## 2011-09-13 LAB — URINALYSIS W/ REFLEX CULTURE
Bacteria: NEGATIVE /HPF
Bilirubin: NEGATIVE
Glucose: NEGATIVE MG/DL
Ketone: NEGATIVE MG/DL
Leukocyte Esterase: NEGATIVE
Nitrites: NEGATIVE
Protein: NEGATIVE MG/DL
Specific gravity: 1.023 (ref 1.003–1.030)
Urobilinogen: 0.2 EU/DL (ref 0.2–1.0)
pH (UA): 6 (ref 5.0–8.0)

## 2011-09-13 LAB — METABOLIC PANEL, COMPREHENSIVE
A-G Ratio: 1 — ABNORMAL LOW (ref 1.1–2.2)
ALT (SGPT): 66 U/L (ref 12–78)
AST (SGOT): 44 U/L — ABNORMAL HIGH (ref 15–37)
Albumin: 4.3 g/dL (ref 3.5–5.0)
Alk. phosphatase: 94 U/L (ref 50–136)
Anion gap: 8 mmol/L (ref 5–15)
BUN/Creatinine ratio: 15 (ref 12–20)
BUN: 11 MG/DL (ref 6–20)
Bilirubin, total: 0.5 MG/DL (ref 0.2–1.0)
CO2: 25 MMOL/L (ref 21–32)
Calcium: 9 MG/DL (ref 8.5–10.1)
Chloride: 105 MMOL/L (ref 97–108)
Creatinine: 0.72 MG/DL (ref 0.45–1.15)
GFR est AA: >60 mL/min/{1.73_m2} (ref >60)
GFR est non-AA: >60 mL/min/{1.73_m2} (ref >60)
Globulin: 4.1 g/dL — ABNORMAL HIGH (ref 2.0–4.0)
Glucose: 108 MG/DL — ABNORMAL HIGH (ref 65–100)
Potassium: 4.2 MMOL/L (ref 3.5–5.1)
Protein, total: 8.4 g/dL — ABNORMAL HIGH (ref 6.4–8.2)
Sodium: 138 MMOL/L (ref 136–145)

## 2011-09-13 LAB — CBC WITH AUTOMATED DIFF
ABS. BASOPHILS: 0 10*3/uL (ref 0.0–0.1)
ABS. EOSINOPHILS: 0.2 10*3/uL (ref 0.0–0.4)
ABS. LYMPHOCYTES: 1.9 10*3/uL (ref 0.8–3.5)
ABS. MONOCYTES: 0.3 10*3/uL (ref 0.0–1.0)
ABS. NEUTROPHILS: 2.6 10*3/uL (ref 1.8–8.0)
BASOPHILS: 1 % (ref 0–1)
EOSINOPHILS: 4 % (ref 0–7)
HCT: 36.2 % (ref 35.0–47.0)
HGB: 12.6 g/dL (ref 11.5–16.0)
LYMPHOCYTES: 37 % (ref 12–49)
MCH: 29.9 PG (ref 26.0–34.0)
MCHC: 34.8 g/dL (ref 30.0–36.5)
MCV: 85.8 FL (ref 80.0–99.0)
MONOCYTES: 7 % (ref 5–13)
NEUTROPHILS: 51 % (ref 32–75)
PLATELET: 230 10*3/uL (ref 150–400)
RBC: 4.22 M/uL (ref 3.80–5.20)
RDW: 14.4 % (ref 11.5–14.5)
WBC: 5 10*3/uL (ref 3.6–11.0)

## 2011-09-13 LAB — CK W/ CKMB & INDEX
CK - MB: 0.5 NG/ML (ref 0.5–3.6)
CK-MB Index: 0.4 (ref 0–2.5)
CK: 142 U/L (ref 26–192)

## 2011-09-13 LAB — HCG URINE, QL: HCG urine, QL: NEGATIVE

## 2011-09-13 LAB — TROPONIN I: Troponin-I, Qt.: <0.04 ng/mL (ref <0.05)

## 2011-09-13 MED ORDER — ONDANSETRON (PF) 4 MG/2 ML INJECTION
4 mg/2 mL | INTRAMUSCULAR | Status: AC
Start: 2011-09-13 — End: 2011-09-13
  Administered 2011-09-13: 17:00:00 via INTRAVENOUS

## 2011-09-13 MED ORDER — ONDANSETRON HCL 4 MG TAB
4 mg | ORAL_TABLET | Freq: Three times a day (TID) | ORAL | Status: DC | PRN
Start: 2011-09-13 — End: 2011-09-13

## 2011-09-13 MED ORDER — MECLIZINE 12.5 MG TAB
12.5 mg | ORAL | Status: AC
Start: 2011-09-13 — End: 2011-09-13
  Administered 2011-09-13: 17:00:00 via ORAL

## 2011-09-13 MED ORDER — SODIUM CHLORIDE 0.9 % IJ SYRG
INTRAMUSCULAR | Status: DC | PRN
Start: 2011-09-13 — End: 2011-09-13

## 2011-09-13 MED ORDER — MECLIZINE 25 MG TAB
25 mg | ORAL_TABLET | Freq: Three times a day (TID) | ORAL | Status: DC | PRN
Start: 2011-09-13 — End: 2014-05-08

## 2011-09-13 MED ORDER — SODIUM CHLORIDE 0.9 % IJ SYRG
Freq: Three times a day (TID) | INTRAMUSCULAR | Status: DC
Start: 2011-09-13 — End: 2011-09-13
  Administered 2011-09-13: 18:00:00 via INTRAVENOUS

## 2011-09-13 MED ORDER — PROMETHAZINE 25 MG TAB
25 mg | ORAL_TABLET | Freq: Four times a day (QID) | ORAL | Status: DC | PRN
Start: 2011-09-13 — End: 2012-01-08

## 2011-09-13 MED ADMIN — sodium chloride 0.9 % bolus infusion 1,000 mL: INTRAVENOUS | @ 17:00:00 | NDC 00409798348

## 2011-09-13 MED FILL — ONDANSETRON (PF) 4 MG/2 ML INJECTION: 4 mg/2 mL | INTRAMUSCULAR | Qty: 2

## 2011-09-13 MED FILL — SALINE FLUSH INJECTION SYRINGE: INTRAMUSCULAR | Qty: 10

## 2011-09-13 MED FILL — SODIUM CHLORIDE 0.9 % IV: INTRAVENOUS | Qty: 1000

## 2011-09-13 MED FILL — MECLIZINE 12.5 MG TAB: 12.5 mg | ORAL | Qty: 2

## 2011-09-13 NOTE — ED Provider Notes (Signed)
HPI Comments: Vanessa Kramer is a 41 yo AAF presenting ambulatory to ED with c/o constant dizziness ("room spinning") and nausea x 1 week. Pt states sxs worsened today. Pt states dizziness exacerbated by movement of head. Pt reports associated L ear pain. Pt states she was dx with vertigo by her PCP almost 6 years ago and states sxs feel similar. Pt reports taking Meclizine in past with significant relief. Pt denies recent head CT or MRI and has never been evaluated by neurology/ENT. Pt denies blurred vision, double vision, headache, weakness, numbness, CP, SOB, palpitations, vomiting, diarrhea, abd pain, rhinorrhea, congestion, sore throat, difficulty hearing, and neck pain.     PMHx significant for GERD, HTN    ZOX:WRUEAV Vanessa Hart, MD    There are no other complaints, changes or physical findings at this time.   Written by Vanessa Kramer, ED Scribe, as dictated by Vanessa Del, MD.        The history is provided by the patient. No language interpreter was used.        Past Medical History   Diagnosis Date   ??? GERD (gastroesophageal reflux disease)    ??? Hypertension    ??? Pap smear July 29, 2007        Past Surgical History   Procedure Date   ??? Hx hernia repair 1998   ??? Hx tubal ligation 2004   ??? Hx gyn      D&C         Family History   Problem Relation Age of Onset   ??? Hypertension Mother    ??? Hypertension Father         History     Social History   ??? Marital Status: SINGLE     Spouse Name: N/A     Number of Children: N/A   ??? Years of Education: N/A     Occupational History   ??? Not on file.     Social History Main Topics   ??? Smoking status: Never Smoker    ??? Smokeless tobacco: Never Used   ??? Alcohol Use: 1.5 oz/week     3 Glasses of wine per week      occ   ??? Drug Use: No   ??? Sexually Active: Yes -- Female partner(s)     Other Topics Concern   ??? Not on file     Social History Narrative   ??? No narrative on file                  ALLERGIES: Compazine; Dilaudid; Percocet; and Prednisone      Review of Systems    Constitutional: Negative.    HENT: Positive for ear pain (Left). Negative for hearing loss, congestion, sore throat, rhinorrhea and neck pain.    Eyes: Negative.  Negative for visual disturbance.   Respiratory: Negative.  Negative for cough and shortness of breath.    Cardiovascular: Negative.  Negative for chest pain and palpitations.   Gastrointestinal: Positive for nausea. Negative for vomiting, abdominal pain and diarrhea.   Genitourinary: Negative.    Musculoskeletal: Negative.    Skin: Negative.    Neurological: Positive for dizziness. Negative for weakness, numbness and headaches.   Hematological: Negative.    Psychiatric/Behavioral: Negative.    All other systems reviewed and are negative.        Filed Vitals:    09/13/11 1235 09/13/11 1406   BP: 137/106 134/98   Pulse: 90 90   Temp: 98.3 ??  F (36.8 ??C)    Resp: 18    Height: 5\' 4"  (1.626 Vanessa Kramer)    Weight: 51.801 kg (114 lb 3.2 oz)    SpO2: 100%             Physical Exam   Nursing note and vitals reviewed.  Constitutional: She is oriented to person, place, and time. She appears well-developed and well-nourished. No distress.   HENT:   Head: Normocephalic and atraumatic.   Right Ear: Tympanic membrane normal.   Left Ear: Tympanic membrane normal.   Nose: Nose normal.   Mouth/Throat: Oropharynx is clear and moist. No oropharyngeal exudate.   Eyes: Conjunctivae and EOM are normal. Pupils are equal, round, and reactive to light. Right eye exhibits no discharge. Left eye exhibits no discharge. Right eye exhibits no nystagmus. Left eye exhibits no nystagmus.   Neck: Normal range of motion. Neck supple. No JVD present.   Cardiovascular: Normal rate, regular rhythm, normal heart sounds and intact distal pulses.    No murmur heard.  Pulmonary/Chest: Effort normal and breath sounds normal. No respiratory distress. She has no wheezes. She has no rales.   Abdominal: Soft. Bowel sounds are normal. She exhibits no distension and no mass. There is no tenderness. There is no  rebound and no guarding.   Musculoskeletal: Normal range of motion. She exhibits no edema and no tenderness.   Neurological: She is alert and oriented to person, place, and time. She has normal strength. No cranial nerve deficit or sensory deficit. She exhibits normal muscle tone.        Reproducible dizziness with turning head/lying supine   Skin: Skin is warm and dry. No rash noted.   Psychiatric: She has a normal mood and affect. Her behavior is normal.        MDM     Differential Diagnosis; Clinical Impression; Plan:     DDx: Vertigo, Sinusitis, TIA    Vertigo, reproducible dizziness, resolved with meds, neg labs/CT, PCP f/u  Amount and/or Complexity of Data Reviewed:   Clinical lab tests:  Ordered and reviewed  Tests in the radiology section of CPT??:  Ordered and reviewed  Tests in the medicine section of the CPT??:  Ordered and reviewed  Discussion of test results with the performing providers:  No   Decide to obtain previous medical records or to obtain history from someone other than the patient:  No   Obtain history from someone other than the patient:  No   Review and summarize past medical records:  No   Discuss the patient with another provider:  No   Independant visualization of image, tracing, or specimen:  Yes  Progress:   Patient progress:  Improved and stable      Procedures    2:56 PM  Pt has been reassessed. Pt states dizziness has resolved and is ready to go home.   Written by Vanessa Kramer, ED Scribe, as dictated by Judie Petit. Laurier Nancy, MD.    2:56 PM  Vanessa Kramer  results have been reviewed with her.  She has been counseled regarding her diagnosis.  She verbally conveys understanding and agreement of the signs, symptoms, diagnosis, treatment and prognosis and additionally agrees to follow up as recommended with Dr. Chase Picket, MD in 24 - 48 hours.  She also agrees with the care-plan and conveys that all of her questions have been answered.  I have also put together some discharge  instructions for her that include: 1) educational information  regarding their diagnosis, 2) how to care for their diagnosis at home, as well a 3) list of reasons why they would want to return to the ED prior to their follow-up appointment, should their condition change.   Written by Vanessa Kramer, ED Scribe, as dictated by Judie Petit. Laurier Nancy, MD.    2:59 PM    I have reviewed all pertinent and currently available diagnostic test results for this visit including, but not limited to, labs, xrays, and EKGs. I have reviewed all pertinent and currently available medical records. My plan of care and further evaluation and/or disposition is based on these results, as well as the initial, and subsequent, history and physical exam, as well as any additional complaints during the visit.    Toney Rakes, MD     EKG interpretation: (Preliminary)  Rhythm: normal sinus rhythm; and regular . Rate (approx.): 82; Blocks: none; Ectopy: none; Axis: normal; P wave: normal; QRS interval: normal ; ST/T wave: non-specific changes; in  Lead: ; Other findings: .  This EKG was interpreted by Vanessa Del, MD ,ED Provider.      LABORATORY TESTS:  Recent Results (from the past 12 hour(s))   CBC WITH AUTOMATED DIFF    Collection Time    09/13/11  1:31 PM       Component Value Range    WBC 5.0  3.6 - 11.0 K/uL    RBC 4.22  3.80 - 5.20 Haide Klinker/uL    HGB 12.6  11.5 - 16.0 g/dL    HCT 16.1  09.6 - 04.5 %    MCV 85.8  80.0 - 99.0 FL    MCH 29.9  26.0 - 34.0 PG    MCHC 34.8  30.0 - 36.5 g/dL    RDW 40.9  81.1 - 91.4 %    PLATELET 230  150 - 400 K/uL    NEUTROPHILS 51  32 - 75 %    LYMPHOCYTES 37  12 - 49 %    MONOCYTES 7  5 - 13 %    EOSINOPHILS 4  0 - 7 %    BASOPHILS 1  0 - 1 %    ABS. NEUTROPHILS 2.6  1.8 - 8.0 K/UL    ABS. LYMPHOCYTES 1.9  0.8 - 3.5 K/UL    ABS. MONOCYTES 0.3  0.0 - 1.0 K/UL    ABS. EOSINOPHILS 0.2  0.0 - 0.4 K/UL    ABS. BASOPHILS 0.0  0.0 - 0.1 K/UL   CK W/ CKMB & INDEX    Collection Time    09/13/11  1:31 PM       Component  Value Range    CK - MB 0.5  0.5 - 3.6 NG/ML    CK-MB Index 0.4  0 - 2.5      CK 142  26 - 192 U/L   METABOLIC PANEL, COMPREHENSIVE    Collection Time    09/13/11  1:31 PM       Component Value Range    Sodium 138  136 - 145 MMOL/L    Potassium 4.2  3.5 - 5.1 MMOL/L    Chloride 105  97 - 108 MMOL/L    CO2 25  21 - 32 MMOL/L    Anion gap 8  5 - 15 mmol/L    Glucose 108 (*) 65 - 100 MG/DL    BUN 11  6 - 20 MG/DL    Creatinine 7.82  9.56 - 1.15 MG/DL    BUN/Creatinine ratio  15  12 - 20      GFR est-AA >60  >60 ml/min/1.8m2    GFR est non-AA >60  >60 ml/min/1.16m2    Calcium 9.0  8.5 - 10.1 MG/DL    Bilirubin, total 0.5  0.2 - 1.0 MG/DL    ALT 66  12 - 78 U/L    AST 44 (*) 15 - 37 U/L    Alk. phosphatase 94  50 - 136 U/L    Protein, total 8.4 (*) 6.4 - 8.2 g/dL    Albumin 4.3  3.5 - 5.0 g/dL    Globulin 4.1 (*) 2.0 - 4.0 g/dL    A-G Ratio 1.0 (*) 1.1 - 2.2     TROPONIN I    Collection Time    09/13/11  1:31 PM       Component Value Range    Troponin-I, Qt. <0.04  <0.05 ng/mL   URINALYSIS W/ REFLEX CULTURE    Collection Time    09/13/11  2:00 PM       Component Value Range    Color YELLOW      Appearance CLEAR      Specific gravity 1.023  1.003 - 1.030      pH 6.0  5.0 - 8.0      Protein NEGATIVE   NEGATIVE MG/DL    Glucose NEGATIVE   NEGATIVE MG/DL    Ketone NEGATIVE   NEGATIVE MG/DL    Bilirubin NEGATIVE   NEGATIVE    Blood TRACE (*) NEGATIVE    Urobilinogen 0.2  0.2 - 1.0 EU/DL    Nitrites NEGATIVE   NEGATIVE    Leukocyte Esterase NEGATIVE   NEGATIVE    WBC 0-4  0 - 4 /HPF    RBC 0-3  0 - 5 /HPF    Epithelial cells 0-5  0 - 5 /LPF    Bacteria NEGATIVE   NEGATIVE /HPF    UA:UC IF INDICATED CULTURE NOT INDICATED BY UA RESULT      Hyaline Cast 0-2  0 - 2   HCG URINE, QL    Collection Time    09/13/11  2:00 PM       Component Value Range    HCG urine, Ql. NEGATIVE   NEGATIVE       IMAGING RESULTS:  CT HEAD WO CONT (Final result)   Result time:09/13/11 1401      Final result by Rad Results In Edi (09/13/11 14:01:00)       Narrative:    **Final Report**      ICD Codes / Adm.Diagnosis: 100002 535.50 / Dizziness   Examination: CT HEAD WO CON - [id number] - Sep 13 2011 1:53PM  Accession No: [id number]  Reason: Pain      REPORT:  INDICATION: Pain.    EXAM Multiple contiguous helically acquired images were obtained through the   brain without intravenous contrast administration.    FINDINGS:    Comparison study: None are available.    Images through the brain reveal no confluent infarct or intracranial mass or   shift of midline structures. There is no intracranial hemorrhage. Ventricles   are normal in size in appearance.    Bone windows demonstrate no depressed skull fracture.      IMPRESSION: Unremarkable Non-Contrast Cranial CT.          Signing/Reading Doctor: Louis Matte JR [phone])   ApprovedLouis Matte JR [phone]) Sep 13 2011 2:04PM        MEDICATIONS GIVEN:  Medications   sodium chloride (NS) flush 5-10 mL (10 mL IntraVENous Given 09/13/11 1400)   sodium chloride (NS) flush 5-10 mL (not administered)   meclizine (ANTIVERT) 25 mg tablet (not administered)   ondansetron hcl (ZOFRAN) 4 mg tablet (not administered)   sodium chloride 0.9 % bolus infusion 1,000 mL (1000 mL IntraVENous New Bag 09/13/11 1318)   ondansetron (ZOFRAN) injection 4 mg (4 mg IntraVENous Given 09/13/11 1318)   meclizine (ANTIVERT) tablet 25 mg (25 mg Oral Given 09/13/11 1318)       IMPRESSION:  1. Dizziness    2. Vertigo    3. Nausea alone        PLAN:  1. F/U with ENT  2. Treat with Zofran and Antivert.   3. Return to ED if worse

## 2011-09-14 NOTE — Progress Notes (Signed)
Patient discharged on 09/13/11 from [facility]     During admission patient was evaluated for Dizziness she had xray, labs, CT scans, and labs.      Patient discharged home with 2 prescriptions (antivert and zofran) and instruction to follow up with Dr. [name] in 24-48 hours and Dr. Morey Hummingbird (ENT) in 1 days.       Left voicemail for patient to return call.  Will attempt to contact again.  Need to complete post-discharge assessment.

## 2011-09-15 MED ORDER — AMLODIPINE-BENAZEPRIL 5 MG-10 MG CAP
5-10 mg | ORAL_CAPSULE | Freq: Every day | ORAL | Status: DC
Start: 2011-09-15 — End: 2012-06-14

## 2011-09-15 NOTE — Telephone Encounter (Signed)
Pt states that she must have 90 day called into pharmacy as insurance will not pay for 30 day.      Can this be phoned in today as pt is completely out?  I said I would ask, but it most likely would be Monday.  Pt does have an appt. Scheduled for Monday with Dr. [name]

## 2011-09-17 LAB — EKG, 12 LEAD, INITIAL
Atrial Rate: 82 {beats}/min
Calculated P Axis: 79 degrees
Calculated R Axis: 53 degrees
Calculated T Axis: 45 degrees
Diagnosis: NORMAL
P-R Interval: 146 ms
Q-T Interval: 356 ms
QRS Duration: 72 ms
QTC Calculation (Bezet): 415 ms
Ventricular Rate: 82 {beats}/min

## 2011-09-18 MED ORDER — BUTALBITAL 50 MG-ACETAMINOPHEN 650 MG TABLET
50-650 mg | ORAL_TABLET | Freq: Four times a day (QID) | ORAL | Status: DC | PRN
Start: 2011-09-18 — End: 2012-07-08

## 2011-09-18 MED ORDER — PROMETHAZINE 25 MG TAB
25 mg | ORAL_TABLET | Freq: Four times a day (QID) | ORAL | Status: DC | PRN
Start: 2011-09-18 — End: 2012-01-08

## 2011-09-18 NOTE — Patient Instructions (Addendum)
PRESCRIPTION REFILL POLICY    [facility] Statement to Patients  January 1,2013       In an effort to ensure the large volume of patient prescription refills is processed in the most efficient and expeditious manner, we are asking our patients to assist us by calling your Pharmacy for all prescription refills, this will include also your  Mail Order Pharmacy. The pharmacy will contact our office electronically to continue the refill process.    Please do not wait until the last minute to call your pharmacy. We need at least 48 hours (2days) to fill prescriptions. We also encourage you to call your pharmacy before going to pick up your prescription to make sure it is ready.     With regard to controlled substance prescription refill requests (narcotic refills) that need to be picked up at our office, we ask your cooperation by providing us with at least 72 hours (3days) notice that you will need a refill.    We will not refill narcotic prescription refill requests after 4:00pm on any weekday, Monday through Thursday, or after 2:00pm on Fridays, or on the weekends.      We encourage everyone to explore another way of getting your prescription refill request processed using MyChart, our patient web portal through our electronic medical record system. MyChart is an efficient and effective way to communicate your medication request directly to the office and  downloadable as an app on your smart phone . MyChart also features a review functionality that allows you to view your medication list as well as leave messages for your physician. Are you ready to get connected? If so please review the attatched instructions or speak to any of our staff to get you set up right away!    Thank you so much for your cooperation. Should you have any questions please contact our Practice Administrator.    The Physicians and Staff, [facility]      Earwax Blockage: After Your Visit  Your Care Instructions  Earwax  is a natural substance that protects the ear canal. Normally, earwax drains from the ears and does not cause problems. Sometimes earwax builds up and hardens. Earwax blockage (also called cerumen impaction) can cause some loss of hearing and pain. When wax is tightly packed, you will need to have your doctor remove it.  Follow-up care is a key part of your treatment and safety. Be sure to make and go to all appointments, and call your doctor if you are having problems. It???s also a good idea to know your test results and keep a list of the medicines you take.  How can you care for yourself at home?  ?? Do not try to remove earwax with cotton swabs, fingers, or other objects. This can make the blockage worse and damage the eardrum.   ?? If your doctor recommends that you try to remove earwax at home:   ?? Soften and loosen the earwax with warm mineral oil. You also can try hydrogen peroxide mixed with an equal amount of room temperature water. Place 2 drops of the fluid, warmed to body temperature, in the ear two times a day for up to 5 days.   ?? Once the wax is loose and soft, all that is usually needed to remove it from the ear canal is a gentle, warm shower. Direct the water into the ear, then tip your head to let the earwax drain out. Dry your ear thoroughly with a   hair dryer set on low. Hold the dryer several inches from your ear.   ?? If the warm mineral oil and shower do not work, use an over-the-counter wax softener followed by gentle flushing with an ear syringe each night for a week or two. Make sure the flushing solution is body temperature. Cool or hot fluids in the ear can cause dizziness.   When should you call for help?  Call your doctor now or seek immediate medical care if:  ?? Pus or blood drains from your ear.   ?? Your ears are ringing or feel full.   ?? You have a loss of hearing.   Watch closely for changes in your health, and be sure to contact your doctor if:  ?? You have pain or reduced hearing after  1 week of home treatment.   ?? You have any new symptoms, such as nausea or balance problems.     Where can you learn more?    Go to http://www.healthwise.net/[facility]   Enter Q495 in the search box to learn more about "Earwax Blockage: After Your Visit."    ?? 2006-2013 [employer]. Care instructions adapted under license by [place] (which disclaims liability or warranty for this information). This care instruction is for use with your licensed healthcare professional. If you have questions about a medical condition or this instruction, always ask your healthcare professional. Healthwise, Incorporated disclaims any warranty or liability for your use of this information.  Content Version: 9.7.130178; Last Revised: May 24, 2009

## 2011-09-26 NOTE — Progress Notes (Signed)
SUBJECTIVE  [name] is here with complaint of   Chief Complaint   Patient presents with   ??? Headache     ER visit   .      PMH:  has a past medical history of GERD (gastroesophageal reflux disease); Hypertension; and Pap smear (July 29, 2007).   SH:  reports that she has never smoked. She has never used smokeless tobacco. She reports that she drinks about 1.5 ounces of alcohol per week. She reports that she does not use illicit drugs.   FH: family history includes Hypertension in her father and mother.   ROS: A complete ROS was performed and was negative except as noted above.     OBJECTIVE  Gen: Patient is in no acute distress.  HEENT: PERRLA. EOMI.  OP moist.  Neck: Supple.  Lungs: CTAB.  No wheezes or rales.  Heart: RRR, no murmurs, gallops.  Abdomen: Soft, nontender, nondistended.  No organomegaly.  Extremities: Warm, with no edema.  Neuro: Alert and oriented x3.      ASSESSMENT and PLAN  1. Tension headache  acetaminophen-butalbital (BUPAP) 50-650 mg Tab   2. Dizziness  promethazine (PHENERGAN) 25 mg tablet       Follow-up Disposition:  Return in about 3 months (around 12/19/2011) for HEadaches BP.

## 2011-10-18 NOTE — Progress Notes (Signed)
Patient called and stated that she is doing ok, except she has sinus infection.  She reported facial pressure, headache, tooth ache, nasal drainage - green mucus.  She stated that she has been taking sudafed with not much relief.  She rated pain level 5/10 on pain scale.  Per patient symptoms started on Sunday and getting worse.    Offered patient an appointment today to be evaluated - patient declined due to work schedule.  Patient schedule to come in to see Dr. [name] tomorrow (10/19/11).      She is advised to get nasal rinse at local pharmacy and use to try to relieve some of the nasal congestion.  Patient verbalized understanding

## 2011-10-18 NOTE — Progress Notes (Signed)
Patient completed 30 day post ED visit for Dizziness.  Patient has met all goals and attended follow up visit with PCP as schedule on 09/18/2011.      ED episode will be resolved at this time - LVM for patient to contact this office as needed.    Patient schedule for follow up with Dr. [name] 12/19/11

## 2011-10-19 NOTE — Progress Notes (Signed)
Subjective:   Vanessa Kramer is a 41 y.o. female who complains of congestion, post nasal drip and bilateral sinus pain for 6 days, gradually worsening since that time.  She denies a history of shortness of breath and wheezing.    Evaluation to date: none.   Treatment to date: decongestants.  Patient does not smoke cigarettes.  Relevant PMH: No pertinent additional PMH.    Patient Active Problem List   Diagnosis Code   ??? Dizziness 780.4   ??? HTN (hypertension) 401.9   ??? Vertigo 780.4   ??? Pharyngitis 462     Patient Active Problem List    Diagnosis Date Noted   ??? Dizziness 06/24/2009   ??? HTN (hypertension) 06/24/2009   ??? Vertigo 06/24/2009   ??? Pharyngitis 06/24/2009     Current Outpatient Prescriptions   Medication Sig Dispense Refill   ??? amLODIPine-benazepril (LOTREL) 5-10 mg per capsule Take 1 Cap by mouth daily.  90 Cap  1   ??? RANITIDINE HCL (ZANTAC PO) Take  by mouth two (2) times daily as needed.       ??? promethazine (PHENERGAN) 25 mg tablet Take 1 Tab by mouth every six (6) hours as needed for Nausea.  30 Tab  0   ??? acetaminophen-butalbital (BUPAP) 50-650 mg Tab Take 1 Each by mouth every six (6) hours as needed.  30 Tab  1   ??? meclizine (ANTIVERT) 25 mg tablet Take 1 Tab by mouth three (3) times daily as needed for Dizziness.  20 Tab  0   ??? promethazine (PHENERGAN) 25 mg tablet Take 1 Tab by mouth every six (6) hours as needed for Nausea.  12 Tab  0   ??? ibuprofen (MOTRIN) 800 mg tablet Take 1 Tab by mouth every six (6) hours as needed for Pain.  30 Tab  3   ??? dicyclomine (BENTYL) 10 mg capsule Take 10 mg by mouth 4 times daily (before meals and nightly).         Allergies   Allergen Reactions   ??? Compazine (Prochlorperazine Edisylate) Other (comments)     Lock muscle   ??? Dilaudid (Hydromorphone (Pf)) Itching   ??? Percocet (Oxycodone-Acetaminophen) Itching   ??? Prednisone Swelling     Throat swelling     Past Medical History   Diagnosis Date   ??? GERD (gastroesophageal reflux disease)    ??? Hypertension    ??? Pap  smear July 29, 2007     Past Surgical History   Procedure Date   ??? Hx hernia repair 1998   ??? Hx tubal ligation 2004   ??? Hx gyn      D&C     Family History   Problem Relation Age of Onset   ??? Hypertension Mother    ??? Hypertension Father      History   Substance Use Topics   ??? Smoking status: Never Smoker    ??? Smokeless tobacco: Never Used   ??? Alcohol Use: 1.5 oz/week     3 Glasses of wine per week      occ        Review of Systems  Pertinent items are noted in HPI.    Objective:     BP 112/90   Pulse 95   Temp 97.7 ??F (36.5 ??C) (Oral)   Ht 5\' 4"  (1.626 m)   Wt 117 lb 6.4 oz (53.252 kg)   BMI 20.15 kg/m2   SpO2 98%  General:  alert, cooperative, no distress  Eyes: conjunctivae/corneas clear. PERRL, EOM's intact. Fundi benign   Ears: normal TM's and external ear canals AU   Sinuses: tenderness over bilateral maxillary   Mouth:  Lips, mucosa, and tongue normal. Teeth and gums normal   Neck: supple, symmetrical, trachea midline and no adenopathy.   Heart: S1 and S2 normal, no murmurs noted.    Lungs: clear to auscultation bilaterally   Abdomen: soft, non-tender. Bowel sounds normal. No masses,  no organomegaly        Assessment/Plan:   sinusitis  Discussed the dx and tx of sinusitis.

## 2011-10-19 NOTE — Patient Instructions (Addendum)
PRESCRIPTION REFILL POLICY    [facility] Statement to Patients  January 1,2013       In an effort to ensure the large volume of patient prescription refills is processed in the most efficient and expeditious manner, we are asking our patients to assist us by calling your Pharmacy for all prescription refills, this will include also your  Mail Order Pharmacy. The pharmacy will contact our office electronically to continue the refill process.    Please do not wait until the last minute to call your pharmacy. We need at least 48 hours (2days) to fill prescriptions. We also encourage you to call your pharmacy before going to pick up your prescription to make sure it is ready.     With regard to controlled substance prescription refill requests (narcotic refills) that need to be picked up at our office, we ask your cooperation by providing us with at least 72 hours (3days) notice that you will need a refill.    We will not refill narcotic prescription refill requests after 4:00pm on any weekday, Monday through Thursday, or after 2:00pm on Fridays, or on the weekends.      We encourage everyone to explore another way of getting your prescription refill request processed using MyChart, our patient web portal through our electronic medical record system. MyChart is an efficient and effective way to communicate your medication request directly to the office and  downloadable as an app on your smart phone . MyChart also features a review functionality that allows you to view your medication list as well as leave messages for your physician. Are you ready to get connected? If so please review the attatched instructions or speak to any of our staff to get you set up right away!    Thank you so much for your cooperation. Should you have any questions please contact our Practice Administrator.    The Physicians and Staff, [facility]        Sinusitis: After Your Visit  Your Care Instructions  Sinusitis is  an infection of the lining of the sinus cavities in your head. Sinusitis often follows a cold and causes pain and pressure in your head and face.  Antibiotics can help cure sinusitis caused by bacteria. You should begin to feel better within a couple of days, but some symptoms may last for a month or more. If your doctor thinks that you have a bacterial infection, he or she will probably prescribe antibiotics.  Follow-up care is a key part of your treatment and safety. Be sure to make and go to all appointments, and call your doctor if you are having problems. It???s also a good idea to know your test results and keep a list of the medicines you take.  How can you care for yourself at home?  ?? Take your antibiotics as directed. Do not stop taking them just because you feel better. You need to take the full course of antibiotics.  ?? Take an over-the-counter pain medicine, such as acetaminophen (Tylenol), ibuprofen (Advil, Motrin), or naproxen (Aleve). Read and follow all instructions on the label.  ?? Be careful when taking over-the-counter cold or flu medicines and Tylenol at the same time. Many of these medicines have acetaminophen, which is Tylenol. Read the labels to make sure that you are not taking more than the recommended dose. Too much acetaminophen (Tylenol) can be harmful.  ?? Breathe warm, moist air from a steamy shower, a hot bath, or a sink   filled with hot water. Avoid cold, dry air. Using a humidifier in your home may help. Follow the instructions for cleaning the machine.  ?? Use saline (saltwater) nasal washes to help keep your nasal passages open and wash out mucus and bacteria. You can buy saline nose drops at a grocery store or drugstore. Or you can make your own at home by adding 1 teaspoon of salt and 1 teaspoon of baking soda to 2 cups of distilled water. If you make your own, fill a bulb syringe with the solution, insert the tip into your nostril, and squeeze gently. Blow your nose.  ?? Put a hot,  wet towel or a warm gel pack on your face 3 or 4 times a day for 5 to 10 minutes each time.  ?? Try a decongestant nasal spray like oxymetazoline (Afrin). Do not use it for more than 3 days in a row. Using it for more than 3 days can make your congestion worse.  ?? Take a decongestant such as pseudoephedrine (Sudafed) if your doctor recommends it.  ?? Try a cough medicine with guaifenesin if your doctor recommends it. This can thin your mucus.  ?? If you need to blow your nose, do it gently. Forceful blowing may force thick mucus back into your sinuses. Keep both nostrils open when you blow your nose.  When should you call for help?  Call your doctor now or seek immediate medical care if:  ?? You have new or worse swelling or redness in your face or around your eyes.  Watch closely for changes in your health, and be sure to contact your doctor if:  ?? You have a new or higher fever.  ?? You have new or worse facial pain.  ?? You are not getting better after 2 days (48 hours).  ?? The mucus from your nose becomes thicker (like pus) or has new blood in it.   Where can you learn more?   Go to http://www.healthwise.net/[facility]  Enter I933 in the search box to learn more about "Sinusitis: After Your Visit."   ?? 2006-2013 Healthwise, Incorporated. Care instructions adapted under license by [employer] (which disclaims liability or warranty for this information). This care instruction is for use with your licensed healthcare professional. If you have questions about a medical condition or this instruction, always ask your healthcare professional. Healthwise, Incorporated disclaims any warranty or liability for your use of this information.  Content Version: 9.7.130178; Last Revised: April 11, 2010

## 2011-12-19 NOTE — Progress Notes (Signed)
Subjective:   Vanessa Kramer is a 41 y.o. female with hypertension.  Current Outpatient Prescriptions   Medication Sig Dispense Refill   ??? amLODIPine-benazepril (LOTREL) 5-10 mg per capsule Take 1 Cap by mouth daily.  90 Cap  1   ??? RANITIDINE HCL (ZANTAC PO) Take  by mouth two (2) times daily as needed.       ??? promethazine (PHENERGAN) 25 mg tablet Take 1 Tab by mouth every six (6) hours as needed for Nausea.  30 Tab  0   ??? acetaminophen-butalbital (BUPAP) 50-650 mg Tab Take 1 Each by mouth every six (6) hours as needed.  30 Tab  1   ??? meclizine (ANTIVERT) 25 mg tablet Take 1 Tab by mouth three (3) times daily as needed for Dizziness.  20 Tab  0   ??? promethazine (PHENERGAN) 25 mg tablet Take 1 Tab by mouth every six (6) hours as needed for Nausea.  12 Tab  0   ??? ibuprofen (MOTRIN) 800 mg tablet Take 1 Tab by mouth every six (6) hours as needed for Pain.  30 Tab  3   ??? dicyclomine (BENTYL) 10 mg capsule Take 10 mg by mouth 4 times daily (before meals and nightly).         No current facility-administered medications for this visit.      Hypertension ROS: taking medications as instructed, no medication side effects noted, no TIA's, no chest pain on exertion, no dyspnea on exertion, no swelling of ankles.   New concerns: none.     Objective:   BP 136/89   Pulse 102   Temp(Src) 98.6 ??F (37 ??C) (Oral)   Resp 12   Ht 5\' 4"  (1.626 m)   Wt 120 lb 9.6 oz (54.704 kg)   BMI 20.69 kg/m2   SpO2 100%   Appearance alert, well appearing, and in no distress.  General exam BP noted to be well controlled today in office, S1, S2 normal, no gallop, no murmur, chest clear, no JVD, no HSM, no edema.   .     Assessment:    Hypertension reasonably well controlled.     Plan:   current treatment plan is effective, no change in therapy.

## 2011-12-19 NOTE — Patient Instructions (Addendum)
PRESCRIPTION REFILL POLICY    [facility] Statement to Patients  January 1,2013       In an effort to ensure the large volume of patient prescription refills is processed in the most efficient and expeditious manner, we are asking our patients to assist [place] by calling your Pharmacy for all prescription refills, this will include also your  Mail Order Pharmacy. The pharmacy will contact our office electronically to continue the refill process.    Please do not wait until the last minute to call your pharmacy. We need at least 48 hours (2days) to fill prescriptions. We also encourage you to call your pharmacy before going to pick up your prescription to make sure it is ready.     With regard to controlled substance prescription refill requests (narcotic refills) that need to be picked up at our office, we ask your cooperation by providing [place] with at least 72 hours (3days) notice that you will need a refill.    We will not refill narcotic prescription refill requests after 4:00pm on any weekday, Monday through Thursday, or after 2:00pm on Fridays, or on the weekends.      We encourage everyone to explore another way of getting your prescription refill request processed using MyChart, our patient web portal through our electronic medical record system. MyChart is an efficient and effective way to communicate your medication request directly to the office and  downloadable as an app on your smart phone . MyChart also features a review functionality that allows you to view your medication list as well as leave messages for your physician. Are you ready to get connected? If so please review the attatched instructions or speak to any of our staff to get you set up right away!    Thank you so much for your cooperation. Should you have any questions please contact our Research officer, political party.    The Physicians and Staff, [facility]          DASH Diet: After Your Visit  Your Care Instructions  The DASH  diet is an eating plan that can help lower your blood pressure. DASH stands for Dietary Approaches to Stop Hypertension. Hypertension is high blood pressure.  The DASH diet focuses on eating foods that are high in calcium, potassium, and magnesium. These nutrients can lower blood pressure. The foods that are highest in these nutrients are fruits, vegetables, low-fat dairy products, nuts, seeds, and legumes. But taking calcium, potassium, and magnesium supplements instead of eating foods that are high in those nutrients does not have the same effect. The DASH diet also includes whole grains, fish, and poultry.  The DASH diet is one of several lifestyle changes your doctor may recommend to lower your high blood pressure. Your doctor may also want you to decrease the amount of sodium in your diet. Lowering sodium while following the DASH diet can lower blood pressure even further than just the DASH diet alone.  Follow-up care is a key part of your treatment and safety. Be sure to make and go to all appointments, and call your doctor if you are having problems. It's also a good idea to know your test results and keep a list of the medicines you take.  How can you care for yourself at home?  Following the DASH diet  ?? Eat 4 to 5 servings of fruit each day. A serving is 1 medium-sized piece of fruit, ?? cup chopped or canned fruit, 1/4 cup dried fruit, or  4 ounces (?? cup) of fruit juice. Choose fruit more often than fruit juice.  ?? Eat 4 to 5 servings of vegetables each day. A serving is 1 cup of lettuce or raw leafy vegetables, ?? cup of chopped or cooked vegetables, or 4 ounces (?? cup) of vegetable juice. Choose vegetables more often than vegetable juice.  ?? Get 2 to 3 servings of low-fat and fat-free dairy each day. A serving is 8 ounces of milk, 1 cup of yogurt, or 1 ?? ounces of cheese.  ?? Eat 6 to 8 servings of grains each day. A serving is 1 slice of bread, 1 ounce of dry cereal, or ?? cup of cooked rice, pasta, or  cooked cereal. Try to choose whole-grain products as much as possible.  ?? Limit lean meat, poultry, and fish to 2 servings each day. A serving is 3 ounces, about the size of a deck of cards.  ?? Eat 4 to 5 servings of nuts, seeds, and legumes (cooked dried beans, lentils, and split peas) each week. A serving is 1/3 cup of nuts, 2 tablespoons of seeds, or ?? cup cooked dried beans or peas.  ?? Limit fats and oils to 2 to 3 servings each day. A serving is 1 teaspoon of vegetable oil or 2 tablespoons of salad dressing.  ?? Limit sweets and added sugars to 5 servings or less a week. A serving is 1 tablespoon jelly or jam, ?? cup sorbet, or 1 cup of lemonade.  ?? Eat less than 2,300 milligrams (mg) of sodium a day. If you have high blood pressure, diabetes, or chronic kidney disease, if you are African-American, or if you are older than age 81, try to limit the amount of sodium you eat to less than 1,500 mg a day.  Tips for success  ?? Start small. Do not try to make dramatic changes to your diet all at once. You might feel that you are missing out on your favorite foods and then be more likely to not follow the plan. Make small changes, and stick with them. Once those changes become habit, add a few more changes.  ?? Try some of the following:  ?? Make it a goal to eat a fruit or vegetable at every meal and at snacks. This will make it easy to get the recommended amount of fruits and vegetables each day.  ?? Try yogurt topped with fruit and nuts for a snack or healthy dessert.  ?? Add lettuce, tomato, cucumber, and onion to sandwiches.  ?? Combine a ready-made pizza crust with low-fat mozzarella cheese and lots of vegetable toppings. Try using tomatoes, squash, spinach, broccoli, carrots, cauliflower, and onions.  ?? Have a variety of cut-up vegetables with a low-fat dip as an appetizer instead of chips and dip.  ?? Sprinkle sunflower seeds or chopped almonds over salads. Or try adding chopped walnuts or almonds to cooked vegetables.   ?? Try some vegetarian meals using beans and peas. Add garbanzo or kidney beans to salads. Make burritos and tacos with mashed pinto beans or black beans.    Where can you learn more?    Go to [email or web address]   Enter H967 in the search box to learn more about "DASH Diet: After Your Visit."    ?? 2006-2013 Healthwise, Incorporated. Care instructions adapted under license by [employer] (which disclaims liability or warranty for this information). This care instruction is for use with your licensed healthcare professional. If you have questions about a medical condition  or this instruction, always ask your healthcare professional. Healthwise, Inc[employer] disclaims any warranty or liability for your use of this information.  Content Version: 9.8.193578; Last Revised: Jul 05, 2011                Low Sodium Diet (2,000 Milligram): After Your Visit  Your Care Instructions  Too much sodium causes your body to hold on to extra water. This can raise your blood pressure and force your heart and kidneys to work harder. In very serious cases, this could cause you to be put in the hospital. It might even be life-threatening. By limiting sodium, you will feel better and lower your risk of serious problems.  The most common source of sodium is salt. People get most of the salt in their diet from canned, prepared, and packaged foods. Fast food and restaurant meals also are very high in sodium. Your doctor will probably limit your sodium to less than 2,000 milligrams (mg) a day. This limit counts all the sodium in prepared and packaged foods and any salt you add to your food.  And try to further reduce how much sodium you eat to less than 1,500 mg a day if you are 51 or older, are black, or have high blood pressure, diabetes, or chronic kidney disease.  Follow-up care is a key part of your treatment and safety. Be sure to make and go to all appointments, and call your doctor if you are having problems. It's also  a good idea to know your test results and keep a list of the medicines you take.  How can you care for yourself at home?  Read food labels  ?? Read labels on cans and food packages. The labels tell you how much sodium is in each serving. Make sure that you look at the serving size. If you eat more than the serving size, you have eaten more sodium.  ?? Food labels also tell you the Percent Daily Value for sodium. Choose products with low Percent Daily Values for sodium.  ?? Be aware that sodium can come in forms other than salt, including monosodium glutamate (MSG), sodium citrate, and sodium bicarbonate (baking soda). MSG is often added to Asian food. When you eat out, you can sometimes ask for food without MSG or added salt.  Buy low-sodium foods  ?? Buy foods that are labeled "unsalted" (no salt added), "sodium-free" (less than 5 mg of sodium per serving), or "low-sodium" (less than 140 mg of sodium per serving). Foods labeled "reduced-sodium" and "light sodium" may still have too much sodium. Be sure to read the label to see how much sodium you are getting.  ?? Buy fresh vegetables, or frozen vegetables without added sauces. Buy low-sodium versions of canned vegetables, soups, and other canned goods.  Prepare low-sodium meals  ?? Cut back on the amount of salt you use in cooking. This will help you adjust to the taste. Do not add salt after cooking. One teaspoon of salt has about 2,300 mg of sodium.  ?? Take the salt shaker off the table.  ?? Flavor your food with garlic, lemon juice, onion, vinegar, herbs, and spices. Do not use soy sauce, lite soy sauce, steak sauce, onion salt, garlic salt, celery salt, mustard, or ketchup on your food.  ?? Use low-sodium salad dressings, sauces, and ketchup. Or make your own salad dressings and sauces without adding salt.  ?? Use less salt (or none) when recipes call for it. You can often use  half the salt a recipe calls for without losing flavor. Other foods such as rice, pasta, and  grains do not need added salt.  ?? Rinse canned vegetables, and cook them in fresh water. This removes some???but not all???of the salt.  ?? Avoid water that is naturally high in sodium or that has been treated with water softeners, which add sodium. Call your local water company to find out the sodium content of your water supply. If you buy bottled water, read the label and choose a sodium-free brand.  Avoid high-sodium foods  ?? Avoid eating:  ?? Smoked, cured, salted, and canned meat, fish, and poultry.  ?? Ham, bacon, hot dogs, and luncheon meats.  ?? Regular, hard, and processed cheese and regular peanut butter.  ?? Crackers with salted tops, and other salted snack foods such as pretzels, chips, and salted popcorn.  ?? Frozen prepared meals, unless labeled low-sodium.  ?? Canned and dried soups, broths, and bouillon, unless labeled sodium-free or low-sodium.  ?? Canned vegetables, unless labeled sodium-free or low-sodium.  ?? [place] fries, pizza, tacos, and other fast foods.  ?? Pickles, olives, ketchup, and other condiments, especially soy sauce, unless labeled sodium-free or low-sodium.    Where can you learn more?    Go to [email or web address]   Enter V843 in the search box to learn more about "Low Sodium Diet (2,000 Milligram): After Your Visit."    ?? 2006-2013 Health[employer], [employer]. Care instructions adapted under license by [employer] (which disclaims liability or warranty for this information). This care instruction is for use with your licensed healthcare professional. If you have questions about a medical condition or this instruction, always ask your healthcare professional. [employer], [employer] disclaims any warranty or liability for your use of this information.  Content Version: 9.8.193578; Last Revised: February 09, 2010

## 2011-12-25 LAB — LIPID PANEL
Cholesterol, total: 219 mg/dL — ABNORMAL HIGH (ref 100–199)
HDL Cholesterol: 76 mg/dL (ref >39)
LDL, calculated: 129 mg/dL — ABNORMAL HIGH (ref 0–99)
Triglyceride: 70 mg/dL (ref 0–149)
VLDL, calculated: 14 mg/dL (ref 5–40)

## 2011-12-25 LAB — TSH 3RD GENERATION: TSH: 1.71 u[IU]/mL (ref 0.450–4.500)

## 2011-12-25 LAB — HEMOGLOBIN A1C WITH EAG: Hemoglobin A1c: 5.9 % — ABNORMAL HIGH (ref 4.8–5.6)

## 2012-01-04 DIAGNOSIS — J45909 Unspecified asthma, uncomplicated: Secondary | ICD-10-CM

## 2012-01-04 NOTE — ED Notes (Signed)
Patient placed on 2L NC for comfort.

## 2012-01-04 NOTE — ED Provider Notes (Signed)
HPI Comments: 41 y.o.female who presents to the ED for mid chest pain and SOB that began this morning. Pt reports that she started having a productive cough and congestion yesterday and states she "thought it was just allergies". Pt states that she woke up this morning and went to work but started to feel SOB and started to having mid chest pain (rates pain 10/10). Pt states that her pain is worse with deep breathing but does not state if pain radiates. Pt reports she tried laying down and took a Robitussin with no relief. Pt also reports that oxygen in room is not helping her. Pt denies N, V, diarrhea, fever, abdominal pain, or any other acute medical problems. Pt denies hx of blood clots or increased heart rate. Pt has hx of hypertension and GERD.    Social hx: never smoker, drinks 3 glasses of wine per week  PCP: Vanessa Valrie Hart, MD    Note written by Nanetta Batty, Scribe, as dictated by Shirl Harris, MD 10:04 PM            The history is provided by the patient.        Past Medical History   Diagnosis Date   ??? GERD (gastroesophageal reflux disease)    ??? Hypertension    ??? Pap smear July 29, 2007        Past Surgical History   Procedure Laterality Date   ??? Hx hernia repair  1998   ??? Hx tubal ligation  2004   ??? Hx gyn       D&C         Family History   Problem Relation Age of Onset   ??? Hypertension Mother    ??? Hypertension Father         History     Social History   ??? Marital Status: SINGLE     Spouse Name: N/A     Number of Children: N/A   ??? Years of Education: N/A     Occupational History   ??? Not on file.     Social History Main Topics   ??? Smoking status: Never Smoker    ??? Smokeless tobacco: Never Used   ??? Alcohol Use: 1.5 oz/week     3 Glasses of wine per week      Comment: occ   ??? Drug Use: No   ??? Sexually Active: Yes -- Female partner(s)     Other Topics Concern   ??? Not on file     Social History Narrative   ??? No narrative on file                  ALLERGIES: Compazine; Dilaudid; Percocet; and  Prednisone      Review of Systems   Constitutional: Negative for fever and chills.   HENT: Positive for congestion (began yesterday). Negative for rhinorrhea, neck pain and neck stiffness.    Eyes: Negative for pain and redness.   Respiratory: Positive for cough (began yesterday) and shortness of breath (started this morning).    Cardiovascular: Positive for chest pain (mid chest pain that began today, rates pain 10/10).   Gastrointestinal: Negative for nausea, vomiting, abdominal pain and diarrhea.   Genitourinary: Negative for dysuria.   Musculoskeletal: Negative for back pain.   Skin: Negative for rash and wound.   Neurological: Negative for headaches.   All other systems reviewed and are negative.        Filed Vitals:  01/05/12 0130 01/05/12 0157 01/05/12 0902 01/05/12 0932   BP: 138/89 146/93 175/102 153/100   Pulse: 113 114 126 128   Temp: 98 ??F (36.7 ??C) 98.4 ??F (36.9 ??C) 100.3 ??F (37.9 ??C) 99.7 ??F (37.6 ??C)   Resp: 22 20 18     Height:       Weight:       SpO2: 95% 97% 98% 100%            Physical Exam   Nursing note and vitals reviewed.  Constitutional: She is oriented to person, place, and time. She appears well-developed and well-nourished.   HENT:   Head: Normocephalic and atraumatic.   Mouth/Throat: Oropharynx is clear and moist.   Eyes: Conjunctivae and EOM are normal. Pupils are equal, round, and reactive to light.   Neck: Normal range of motion. Neck supple.   Cardiovascular: Regular rhythm, normal heart sounds and intact distal pulses.  Exam reveals no friction rub.    No murmur heard.  HR 135 and regular.   Pulmonary/Chest: Effort normal. No respiratory distress. She has no wheezes. She has no rales.   Coarse breath sounds bilaterally.   Abdominal: Soft. She exhibits no distension. There is no tenderness. There is no rebound and no guarding.   Musculoskeletal: Normal range of motion. She exhibits no edema and no tenderness.   Lymphadenopathy:     She has no cervical adenopathy.   Neurological: She  is alert and oriented to person, place, and time. No cranial nerve deficit. Coordination normal.   Skin: Skin is warm and dry. No rash noted. No pallor.   Psychiatric: She has a normal mood and affect. Her behavior is normal.        MDM     Differential Diagnosis; Clinical Impression; Plan:     41 year old [place] American female with no significant past medical history presents with cough, congestion, tachycardia. Patient reports she is short of breath today. We'll check a chest x-ray to rule out pneumonia. We'll also likely CT her chest for PE since the patient is tachycardic. IV fluids are running.  Amount and/or Complexity of Data Reviewed:   Clinical lab tests:  Ordered and reviewed  Tests in the radiology section of CPT??:  Ordered and reviewed  Tests in the medicine section of the CPT??:  Ordered and reviewed   Decide to obtain previous medical records or to obtain history from someone other than the patient:  Yes   Obtain history from someone other than the patient:  Yes   Review and summarize past medical records:  Yes   Independant visualization of image, tracing, or specimen:  Yes  Risk of Significant Complications, Morbidity, and/or Mortality:   Presenting problems:  High  Diagnostic procedures:  High  Management options:  High  Progress:   Patient progress:  Improved      Procedures    ED EKG interpretation: 10:00 PM    Rhythm: sinus tachycardia; and regular . Rate (approx.): 137 bpm; Axis: normal; P wave: normal; QRS interval: normal ; ST/T wave: no ST elevations or depressions; Other findings: prolonged QTC of 561.    Patient has persistent tachycardia. IV fluids are running. Magnesium and potassium has been repleted. Will admit for tachycardia, shortness of breath, chest pain evaluation.    CT scan of the chest is negative for PE    Trop negative    Mag low and repleated    CONSULT  11:41 PM  Talked to Dr. Abelino Derrick hospitalist who will  admit pt    Pt is still tachycardic for unclear reason. She has had  cough and SOB. Low grade temperatures. No PE on CT. No aortic dissection. Other concerns would be sepsis, myocarditis, dehydration. Admission for further testing. Pt agrees.

## 2012-01-04 NOTE — ED Notes (Addendum)
Triage Note: Patient complains of SOB and substernal chest pain that started today. SOB worse with exertion.

## 2012-01-04 NOTE — ED Notes (Signed)
Patient to CT.

## 2012-01-04 NOTE — ED Notes (Signed)
Patient to XRAY.

## 2012-01-04 NOTE — ED Notes (Signed)
Downing MD at bedside.

## 2012-01-05 ENCOUNTER — Inpatient Hospital Stay
Admit: 2012-01-05 | Discharge: 2012-01-07 | Disposition: A | Discharge status: Home / Self Care | Payer: PRIVATE HEALTH INSURANCE | Attending: Internal Medicine | Admitting: Internal Medicine

## 2012-01-05 LAB — D-DIMER, QUANTITATIVE: D-Dimer, Quant: 1.22 mg/L FEU — ABNORMAL HIGH (ref 0.00–0.65)

## 2012-01-05 LAB — URINALYSIS W/ REFLEX CULTURE
Bilirubin: NEGATIVE
Glucose: NEGATIVE MG/DL
Leukocyte Esterase: NEGATIVE
Nitrites: NEGATIVE
Protein: NEGATIVE MG/DL
Specific gravity: 1.02 (ref 1.003–1.030)
Urobilinogen: 0.2 EU/DL (ref 0.2–1.0)
pH (UA): 5.5 (ref 5.0–8.0)

## 2012-01-05 LAB — METABOLIC PANEL, BASIC
Anion gap: 10 mmol/L (ref 5–15)
BUN/Creatinine ratio: 10 — ABNORMAL LOW (ref 12–20)
BUN: 9 MG/DL (ref 6–20)
CO2: 24 MMOL/L (ref 21–32)
Calcium: 8.9 MG/DL (ref 8.5–10.1)
Chloride: 103 MMOL/L (ref 97–108)
Creatinine: 0.88 MG/DL (ref 0.45–1.15)
GFR est AA: >60 mL/min/{1.73_m2} (ref >60)
GFR est non-AA: >60 mL/min/{1.73_m2} (ref >60)
Glucose: 114 MG/DL — ABNORMAL HIGH (ref 65–100)
Potassium: 3.2 MMOL/L — ABNORMAL LOW (ref 3.5–5.1)
Sodium: 137 MMOL/L (ref 136–145)

## 2012-01-05 LAB — CBC WITH AUTOMATED DIFF
ABS. BASOPHILS: 0 10*3/uL (ref 0.0–0.1)
ABS. EOSINOPHILS: 0.1 10*3/uL (ref 0.0–0.4)
ABS. LYMPHOCYTES: 0.7 10*3/uL — ABNORMAL LOW (ref 0.8–3.5)
ABS. MONOCYTES: 0.3 10*3/uL (ref 0.0–1.0)
ABS. NEUTROPHILS: 8.5 10*3/uL — ABNORMAL HIGH (ref 1.8–8.0)
BASOPHILS: 0 % (ref 0–1)
EOSINOPHILS: 1 % (ref 0–7)
HCT: 33.2 % — ABNORMAL LOW (ref 35.0–47.0)
HGB: 11.5 g/dL (ref 11.5–16.0)
LYMPHOCYTES: 7 % — ABNORMAL LOW (ref 12–49)
MCH: 30.1 PG (ref 26.0–34.0)
MCHC: 34.6 g/dL (ref 30.0–36.5)
MCV: 86.9 FL (ref 80.0–99.0)
MONOCYTES: 3 % — ABNORMAL LOW (ref 5–13)
NEUTROPHILS: 89 % — ABNORMAL HIGH (ref 32–75)
PLATELET: 230 10*3/uL (ref 150–400)
RBC: 3.82 M/uL (ref 3.80–5.20)
RDW: 13.2 % (ref 11.5–14.5)
WBC: 9.6 10*3/uL (ref 3.6–11.0)

## 2012-01-05 LAB — HCG URINE, QL. - POC: Pregnancy test,urine (POC): NEGATIVE

## 2012-01-05 LAB — DRUG SCREEN, URINE
AMPHETAMINES: NEGATIVE
BARBITURATES: POSITIVE — AB
BENZODIAZEPINES: NEGATIVE
COCAINE: NEGATIVE
METHADONE: NEGATIVE
OPIATES: NEGATIVE
PCP(PHENCYCLIDINE): NEGATIVE
THC (TH-CANNABINOL): NEGATIVE

## 2012-01-05 LAB — D DIMER: D-dimer: 1.22 mg/L FEU — ABNORMAL HIGH (ref 0.00–0.65)

## 2012-01-05 LAB — EKG, 12 LEAD, INITIAL
Atrial Rate: 137 {beats}/min
Calculated P Axis: 82 degrees
Calculated R Axis: 49 degrees
Calculated T Axis: 63 degrees
P-R Interval: 120 ms
Q-T Interval: 372 ms
QRS Duration: 72 ms
QTC Calculation (Bezet): 561 ms
Ventricular Rate: 137 {beats}/min

## 2012-01-05 LAB — MAGNESIUM: Magnesium: 1.2 MG/DL — ABNORMAL LOW (ref 1.6–2.4)

## 2012-01-05 LAB — TROPONIN I: Troponin-I, Qt.: <0.04 ng/mL (ref <0.05)

## 2012-01-05 LAB — TSH 3RD GENERATION: TSH: 0.74 u[IU]/mL (ref 0.36–3.74)

## 2012-01-05 LAB — CK W/ REFLX CKMB: CK: 190 U/L (ref 26–192)

## 2012-01-05 MED ADMIN — potassium chloride 10 mEq in 100 ml IVPB: INTRAVENOUS | @ 04:00:00 | NDC 00409707426

## 2012-01-05 MED ADMIN — amLODIPine (NORVASC) tablet 5 mg: ORAL | @ 14:00:00 | NDC 59762153002

## 2012-01-05 MED ADMIN — levofloxacin (LEVAQUIN) 750 mg in D5W IVPB: INTRAVENOUS | @ 19:00:00 | NDC 25021013283

## 2012-01-05 MED ADMIN — sodium chloride (NS) flush 5-10 mL: INTRAVENOUS | @ 11:00:00 | NDC 87701099893

## 2012-01-05 MED ADMIN — 0.9% sodium chloride infusion: INTRAVENOUS | @ 14:00:00 | NDC 00409798309

## 2012-01-05 MED ADMIN — sodium chloride 0.9 % bolus infusion 500 mL: INTRAVENOUS | @ 09:00:00 | NDC 00409798309

## 2012-01-05 MED ADMIN — magnesium sulfate 1 g/100 ml IVPB (premix or compounded): INTRAVENOUS | @ 04:00:00 | NDC 00409672723

## 2012-01-05 MED ADMIN — benazepril (LOTENSIN) tablet 10 mg: ORAL | @ 15:00:00 | NDC 00093512501

## 2012-01-05 MED ADMIN — guaiFENesin SR (MUCINEX) tablet 600 mg: ORAL | @ 19:00:00 | NDC 45802049878

## 2012-01-05 MED ADMIN — sodium chloride 0.9 % bolus infusion 1,000 mL: INTRAVENOUS | @ 04:00:00 | NDC 00409798309

## 2012-01-05 MED ADMIN — enoxaparin (LOVENOX) injection 40 mg: SUBCUTANEOUS | @ 08:00:00 | NDC 00075801401

## 2012-01-05 MED ADMIN — 0.9% sodium chloride infusion: INTRAVENOUS | @ 07:00:00 | NDC 00409798309

## 2012-01-05 MED ADMIN — ibuprofen (MOTRIN) tablet 600 mg: ORAL | @ 03:00:00 | NDC 00904585461

## 2012-01-05 MED ADMIN — ioversol (OPTIRAY) 350 mg iodine/mL contrast solution 100 mL: INTRAVENOUS | @ 04:00:00 | NDC 00019133311

## 2012-01-05 MED ADMIN — sodium chloride (NS) flush 10 mL: INTRAVENOUS | @ 04:00:00 | NDC 87701099893

## 2012-01-05 MED ADMIN — ibuprofen (MOTRIN) tablet 800 mg: ORAL | @ 19:00:00 | NDC 00904585361

## 2012-01-05 MED ADMIN — sodium chloride (NS) flush 5-10 mL: INTRAVENOUS | @ 20:00:00 | NDC 87701099893

## 2012-01-05 MED ADMIN — sodium chloride 0.9 % bolus infusion 100 mL: INTRAVENOUS | @ 04:00:00 | NDC 00409798437

## 2012-01-05 MED ADMIN — magnesium sulfate 1 g/50 ml NS infusion: INTRAVENOUS | @ 04:00:00 | NDC 00074494301

## 2012-01-05 MED ADMIN — 0.9% sodium chloride infusion: INTRAVENOUS | @ 12:00:00 | NDC 00409798309

## 2012-01-05 MED ADMIN — sodium chloride 0.9 % bolus infusion 1,000 mL: INTRAVENOUS | @ 03:00:00 | NDC 00409798309

## 2012-01-05 MED FILL — BENAZEPRIL 10 MG TAB: 10 mg | ORAL | Qty: 1

## 2012-01-05 MED FILL — SODIUM CHLORIDE 0.9 % IV: INTRAVENOUS | Qty: 1000

## 2012-01-05 MED FILL — LOVENOX 40 MG/0.4 ML SUBCUTANEOUS SYRINGE: 40 mg/0.4 mL | SUBCUTANEOUS | Qty: 0.4

## 2012-01-05 MED FILL — MUCINEX 600 MG TABLET, EXTENDED RELEASE: 600 mg | ORAL | Qty: 1

## 2012-01-05 MED FILL — IBUPROFEN 600 MG TAB: 600 mg | ORAL | Qty: 1

## 2012-01-05 MED FILL — SODIUM CHLORIDE 0.9 % IV: INTRAVENOUS | Qty: 500

## 2012-01-05 MED FILL — LEVAQUIN 750 MG/150 ML IN 5 % DEXTROSE INTRAVENOUS PIGGYBACK: 750 mg/150 mL | INTRAVENOUS | Qty: 150

## 2012-01-05 MED FILL — AMLODIPINE 5 MG TAB: 5 mg | ORAL | Qty: 1

## 2012-01-05 MED FILL — BD POSIFLUSH NORMAL SALINE 0.9 % INJECTION SYRINGE: INTRAMUSCULAR | Qty: 10

## 2012-01-05 MED FILL — BD POSIFLUSH NORMAL SALINE 0.9 % INJECTION SYRINGE: INTRAMUSCULAR | Qty: 50

## 2012-01-05 MED FILL — POTASSIUM CHLORIDE 10 MEQ/100 ML IV PIGGY BACK: 10 mEq/0 mL | INTRAVENOUS | Qty: 100

## 2012-01-05 MED FILL — BUTALBITAL-ACETAMINOPHEN 50 MG-325 MG TAB: 50-325 mg | ORAL | Qty: 1

## 2012-01-05 MED FILL — SODIUM CHLORIDE 0.9 % IV: INTRAVENOUS | Qty: 100

## 2012-01-05 MED FILL — OPTIRAY 350 MG IODINE/ML INTRAVENOUS SOLUTION: 350 mg iodine/mL | INTRAVENOUS | Qty: 100

## 2012-01-05 MED FILL — IBUPROFEN 400 MG TAB: 400 mg | ORAL | Qty: 2

## 2012-01-05 MED FILL — MAGNESIUM SULFATE IN D5W 1 GRAM/100 ML IV PIGGY BACK: 1 gram/00 mL | INTRAVENOUS | Qty: 100

## 2012-01-05 NOTE — Progress Notes (Signed)
Bedside shift change report given to Lisa RN (oncoming nurse) by Elizabeth RN (offgoing nurse).  Report given with SBAR, Kardex, MAR and Recent Results.

## 2012-01-05 NOTE — Progress Notes (Signed)
TRANSFER - OUT REPORT:    Verbal report given to John RN(name) on CLARIS PECH  being transferred to [facility](unit) for routine progression of care       Report consisted of patient???s Situation, Background, Assessment and   Recommendations(SBAR).     Information from the following report(s) SBAR, Kardex and Recent Results was reviewed with the receiving nurse.    Opportunity for questions and clarification was provided.

## 2012-01-05 NOTE — Progress Notes (Signed)
TRANSFER - IN REPORT:    Verbal report received from Lauren RN(name) on Vanessa Kramer  being received from ED(unit) for routine progression of care      Report consisted of patient???s Situation, Background, Assessment and   Recommendations(SBAR).     Information from the following report(s) SBAR, Kardex, ED Summary and Recent Results was reviewed with the receiving nurse.    Opportunity for questions and clarification was provided.      Assessment completed upon patient???s arrival to unit and care assumed.

## 2012-01-05 NOTE — Progress Notes (Signed)
Lucky Rathke, MD  [employer] [phone]  After 7pm please call operator for physician on call      Hospitalist Progress Note            Daily Progress Note: 01/05/2012    [name], MD    Assessment/Plan:   1. Shortness of breath (01/05/2012): Possibly reactive airway dz cont nebs/ mucinex CXR negative. CTA chest negative for PE. No fever, or leukocytosis.   2. UTI? - start levaquin fever upto 100 noted with tachy  3. Replete lytes and recheck tomorrow  4. Tachycardia: may be secondary to hypokalemia and hypomag. Will replete and monitor.   5. HTN: cont amlodipine and benzapril  5. DVT and GI ppx       Subjective:   The patient is without any acute complaints at this time.      Review of Systems:   Review of Systems:    Symptom Y/N Comments  Symptom Y/N Comments   Fever/Chills y   Chest Pain n    Poor Appetite n   Edema n    Cough y   Abdominal Pain n    Sputum    Joint Pain     SOB/DOE    Pruritis/Rash     Nausea/vomit    Tolerating PT/OT     Diarrhea    Tolerating Diet     Constipation    Other       Could not obtain due to:        Objective:   Physical Exam:     BP 156/100   Pulse 128   Temp(Src) 100 ??F (37.8 ??C)   Resp 18   Ht 5\' 4"  (1.626 m)   Wt 121 lb   BMI 20.76 kg/m2   SpO2 100%   LMP 12/27/2011   O2 Device: Room air    Temp (24hrs), Avg:99.4 ??F (37.4 ??C), Min:98 ??F (36.7 ??C), Max:100.3 ??F (37.9 ??C)        11/06 1900 - 11/08 0659  In: 240 [P.O.:240]  Out: -       PHYSICAL EXAM:  General: WD, WN. Alert, cooperative, no acute distress????  EENT:  EOMI. Anicteric sclerae. MMM  Resp:              Few  Wheezing+  CV:  Regular  rhythm,??  No edema  GI:  Soft,??+Bowel sounds, no HSM  Neurologic:?? CN 2-12 gi, Alert and oriented X 3.          Data Review:       Recent Days:  Recent Labs      01/04/12   2158   WBC  9.6   HGB  11.5   HCT  33.2*   PLT  230     Recent Labs      01/04/12   2158   NA  137   K  3.2*   CL  103   CO2  24   GLU  114*   BUN  9   CREA  0.88   CA  8.9   MG   1.2*     No results found for this basename: PH, PCO2, PO2, HCO3, FIO2,  in the last 72 hours    24 Hour Results:  Recent Results (from the past 24 hour(s))   EKG, 12 LEAD, INITIAL    Collection Time    01/04/12  9:48 PM       Result Value Range  Ventricular Rate 137      Atrial Rate 137      P-R Interval 120      QRS Duration 72      Q-T Interval 372      QTC Calculation (Bezet) 561      Calculated P Axis 82      Calculated R Axis 49      Calculated T Axis 63      Diagnosis        Value: Sinus tachycardia      Nonspecific ST and T wave abnormality      When compared with ECG of 13-Sep-2011 13:38,      Vent. rate has increased BY  55 BPM      Nonspecific T wave abnormality now evident in Lateral leads      Confirmed by Sheliah Hatch, MD, Loraine Leriche ([id number]) on 01/05/2012 8:20:01 AM   CBC WITH AUTOMATED DIFF    Collection Time    01/04/12  9:58 PM       Result Value Range    WBC 9.6  3.6 - 11.0 K/uL    RBC 3.82  3.80 - 5.20 M/uL    HGB 11.5  11.5 - 16.0 g/dL    HCT 29.5 (*) 28.4 - 47.0 %    MCV 86.9  80.0 - 99.0 FL    MCH 30.1  26.0 - 34.0 PG    MCHC 34.6  30.0 - 36.5 g/dL    RDW 13.2  44.0 - 10.2 %    PLATELET 230  150 - 400 K/uL    NEUTROPHILS 89 (*) 32 - 75 %    LYMPHOCYTES 7 (*) 12 - 49 %    MONOCYTES 3 (*) 5 - 13 %    EOSINOPHILS 1  0 - 7 %    BASOPHILS 0  0 - 1 %    ABS. NEUTROPHILS 8.5 (*) 1.8 - 8.0 K/UL    ABS. LYMPHOCYTES 0.7 (*) 0.8 - 3.5 K/UL    ABS. MONOCYTES 0.3  0.0 - 1.0 K/UL    ABS. EOSINOPHILS 0.1  0.0 - 0.4 K/UL    ABS. BASOPHILS 0.0  0.0 - 0.1 K/UL    DF SMEAR SCANNED      RBC COMMENTS NORMOCYTIC, NORMOCHROMIC     METABOLIC PANEL, BASIC    Collection Time    01/04/12  9:58 PM       Result Value Range    Sodium 137  136 - 145 MMOL/L    Potassium 3.2 (*) 3.5 - 5.1 MMOL/L    Chloride 103  97 - 108 MMOL/L    CO2 24  21 - 32 MMOL/L    Anion gap 10  5 - 15 mmol/L    Glucose 114 (*) 65 - 100 MG/DL    BUN 9  6 - 20 MG/DL    Creatinine 7.25  3.66 - 1.15 MG/DL    BUN/Creatinine ratio 10 (*) 12 - 20      GFR est AA >60  >60  ml/min/1.24m2    GFR est non-AA >60  >60 ml/min/1.57m2    Calcium 8.9  8.5 - 10.1 MG/DL   TROPONIN I    Collection Time    01/04/12  9:58 PM       Result Value Range    Troponin-I, Qt. <0.04  <0.05 ng/mL   CK W/ REFLX CKMB    Collection Time    01/04/12  9:58 PM       Result  Value Range    CK 190  26 - 192 U/L   D DIMER    Collection Time    01/04/12  9:58 PM       Result Value Range    D-dimer 1.22 (*) 0.00 - 0.65 mg/L FEU   MAGNESIUM    Collection Time    01/04/12  9:58 PM       Result Value Range    Magnesium 1.2 (*) 1.6 - 2.4 MG/DL   TSH, 3RD GENERATION    Collection Time    01/04/12  9:58 PM       Result Value Range    TSH 0.74  0.36 - 3.74 uIU/mL   URINALYSIS W/ REFLEX CULTURE    Collection Time    01/04/12 10:19 PM       Result Value Range    Color YELLOW/STRAW      Appearance CLOUDY (*) CLEAR    Specific gravity 1.020  1.003 - 1.030      pH 5.5  5.0 - 8.0      Protein NEGATIVE   NEGATIVE MG/DL    Glucose NEGATIVE   NEGATIVE MG/DL    Ketone TRACE (*) NEGATIVE MG/DL    Bilirubin NEGATIVE   NEGATIVE    Blood SMALL (*) NEGATIVE    Urobilinogen 0.2  0.2 - 1.0 EU/DL    Nitrites NEGATIVE   NEGATIVE    Leukocyte Esterase NEGATIVE   NEGATIVE    WBC 0-4  0 - 4 /HPF    RBC 0-5  0 - 5 /HPF    Epithelial cells MODERATE (*) FEW /LPF    Bacteria 1+ (*) NEGATIVE /HPF    UA:UC IF INDICATED URINE CULTURE ORDERED (*) CULTURE NOT INDICATE   HCG URINE, QL. - POC    Collection Time    01/04/12 10:21 PM       Result Value Range    Pregnancy test,urine (POC) NEGATIVE   NEGATIVE   DRUG SCREEN, URINE    Collection Time    01/05/12  1:20 AM       Result Value Range    AMPHETAMINE NEGATIVE   NEGATIVE    BARBITURATES POSITIVE (*) NEGATIVE    BENZODIAZEPINE NEGATIVE   NEGATIVE    COCAINE NEGATIVE   NEGATIVE    METHADONE NEGATIVE   NEGATIVE    OPIATES NEGATIVE   NEGATIVE    PCP(PHENCYCLIDINE) NEGATIVE   NEGATIVE    THC (TH-CANNABINOL) NEGATIVE   NEGATIVE    DRUG SCRN COMMENT (NOTE)         Problem List:  Problem List as of 01/05/2012 Date  Reviewed: 06-16-11        Codes Class Noted - Resolved    *Shortness of breath 786.05  01/05/2012 - Present        Hypokalemia 276.8  01/05/2012 - Present        Hypomagnesemia 275.2  01/05/2012 - Present        Dizziness 780.4  06/24/2009 - Present        HTN (hypertension) 401.9  06/24/2009 - Present        Vertigo 780.4  06/24/2009 - Present        Pharyngitis 462  06/24/2009 - Present              Medications reviewed- Reviewed   Current Facility-Administered Medications   Medication Dose Route Frequency   ??? benazepril (LOTENSIN) tablet 10 mg  10 mg Oral DAILY   ??? ibuprofen (MOTRIN)  tablet 800 mg  800 mg Oral Q6H PRN   ??? famotidine (PEPCID) tablet 20 mg  20 mg Oral BID PRN   ??? sodium chloride (NS) flush 5-10 mL  5-10 mL IntraVENous Q8H   ??? sodium chloride (NS) flush 5-10 mL  5-10 mL IntraVENous PRN   ??? ondansetron (ZOFRAN) injection 4 mg  4 mg IntraVENous Q6H PRN   ??? enoxaparin (LOVENOX) injection 40 mg  40 mg SubCUTAneous Q24H   ??? amLODIPine (NORVASC) tablet 5 mg  5 mg Oral DAILY   ??? [COMPLETED] sodium chloride 0.9 % bolus infusion 500 mL  500 mL IntraVENous ONCE   ??? 0.9% sodium chloride infusion  125 mL/hr IntraVENous CONTINUOUS   ??? levofloxacin (LEVAQUIN) 750 mg in D5W IVPB  750 mg IntraVENous Q24H   ??? butalbital-acetaminophen (PHRENILIN) 50-325 mg tablet 1 Tab  1 Tab Oral Q6H PRN   ??? albuterol (PROVENTIL VENTOLIN) nebulizer solution 1.25 mg  1.25 mg Nebulization Q6H RT   ??? guaiFENesin SR (MUCINEX) tablet 600 mg  600 mg Oral Q12H   ??? [COMPLETED] sodium chloride 0.9 % bolus infusion 1,000 mL  1,000 mL IntraVENous ONCE   ??? [COMPLETED] ibuprofen (MOTRIN) tablet 600 mg  600 mg Oral NOW   ??? [COMPLETED] potassium chloride 10 mEq in 100 ml IVPB  10 mEq IntraVENous NOW   ??? [COMPLETED] ioversol (OPTIRAY) 350 mg iodine/mL contrast solution 100 mL  100 mL IntraVENous RAD ONCE   ??? [COMPLETED] sodium chloride 0.9 % bolus infusion 100 mL  100 mL IntraVENous RAD ONCE   ??? [COMPLETED] sodium chloride (NS) flush 10 mL  10 mL  IntraVENous RAD ONCE   ??? [COMPLETED] magnesium sulfate 1 g/100 ml IVPB (premix or compounded)  1 g IntraVENous ONCE   ??? [COMPLETED] sodium chloride 0.9 % bolus infusion 1,000 mL  1,000 mL IntraVENous ONCE       Code Status:  Full Code x   DNR/DNI    ______________________________________________________________________  Total NON critical care TIME:    Minutes    Total CRITICAL CARE TIME Spent:   Minutes      Comments   >50% of visit spent in counseling and coordination of care     ______________________________________________________________________  Procedures: see electronic medical records for all procedures/Xrays and details which were not copied into this note but were reviewed prior to creation of Plan.          Total time spent with patient: 30 minutes.    Lucky Rathke, MD

## 2012-01-05 NOTE — Progress Notes (Signed)
TRANSFER - IN REPORT:    Verbal report received from [facility] Rn(name) on Vanessa Kramer  being received from 2N(unit) for routine progression of care      Report consisted of patient???s Situation, Background, Assessment and   Recommendations(SBAR).     Information from the following report(s) SBAR, Kardex, [facility] and Recent Results was reviewed with the receiving nurse.    Opportunity for questions and clarification was provided.      Assessment completed upon patient???s arrival to unit and care assumed.

## 2012-01-05 NOTE — H&P (Signed)
[facility] [place]  Admission History and Physical      NAME:  Vanessa Kramer   DOB:   1970-06-21   MRN:  [id number]     PCP:  Vanessa Picket, MD     Date/Time:  01/05/2012         Subjective:     CHIEF COMPLAINT: shortness of breath    HISTORY OF PRESENT ILLNESS:     Vanessa Kramer is a 41 y.o.  With h/o HTN presented to the ED for mid chest pain and SOB that began this morning and has ben progressively worse. Pt reported that she started having a productive cough and congestion yesterday and thought it was just allergies but woke up this morning and went to work but started to feel more SOB and started to having mid chest pain 10/10, worse with deep breathing but does not radiates. Pt denied Nausea, Vomiting, diarrhea, fever, abdominal pain, or any other acute medical problems.    Past Medical History   Diagnosis Date   ??? GERD (gastroesophageal reflux disease)    ??? Hypertension    ??? Pap smear July 29, 2007        Past Surgical History   Procedure Laterality Date   ??? Hx hernia repair  1998   ??? Hx tubal ligation  2004   ??? Hx gyn       D&C       History   Substance Use Topics   ??? Smoking status: Never Smoker    ??? Smokeless tobacco: Never Used   ??? Alcohol Use: 1.5 oz/week     3 Glasses of wine per week      Comment: occ        Family History   Problem Relation Age of Onset   ??? Hypertension Mother    ??? Hypertension Father         Allergies   Allergen Reactions   ??? Compazine (Prochlorperazine Edisylate) Other (comments)     Lock muscle   ??? Dilaudid (Hydromorphone (Pf)) Itching   ??? Percocet (Oxycodone-Acetaminophen) Itching   ??? Prednisone Swelling     Throat swelling        Prior to Admission medications    Medication Sig Start Date End Date Taking? Authorizing Provider   promethazine (PHENERGAN) 25 mg tablet Take 1 Tab by mouth every six (6) hours as needed for Nausea. 09/18/11   Vanessa Picket, MD   acetaminophen-butalbital (BUPAP) 50-650 mg Tab Take 1 Each by mouth every six (6) hours as needed. 09/18/11    Vanessa Picket, MD   amLODIPine-benazepril (LOTREL) 5-10 mg per capsule Take 1 Cap by mouth daily. 09/15/11   Vanessa Picket, MD   meclizine (ANTIVERT) 25 mg tablet Take 1 Tab by mouth three (3) times daily as needed for Dizziness. 09/13/11   Barnie Del, MD   promethazine (PHENERGAN) 25 mg tablet Take 1 Tab by mouth every six (6) hours as needed for Nausea. 09/13/11   Barnie Del, MD   ibuprofen (MOTRIN) 800 mg tablet Take 1 Tab by mouth every six (6) hours as needed for Pain. 04/24/11   Vanessa Picket, MD   RANITIDINE HCL (ZANTAC PO) Take  by mouth two (2) times daily as needed.    Historical Provider   dicyclomine (BENTYL) 10 mg capsule Take 10 mg by mouth 4 times daily (before meals and nightly).    Phys Other, MD  Review of Systems:    HEENT: Positive for congestion. Negative for rhinorrhea, neck pain and neck stiffness.   Eyes: Negative for pain and redness.   Respiratory: Positive for cough and shortness of breath.   Cardiovascular: Positive for chest pain (mid chest pain that began today, rates pain 10/10).   Gastrointestinal: Negative for nausea, vomiting, abdominal pain and diarrhea.   Genitourinary: Negative for dysuria.   Musculoskeletal: Negative for back pain.   Skin: Negative for rash and wound.   Neurological: Negative for headaches.   All other systems reviewed and are negative.       Objective:      VITALS:    Vital signs reviewed; most recent are:    Visit Vitals   Item Reading   ??? BP 132/80   ??? Pulse 120   ??? Temp 100 ??F (37.8 ??C)   ??? Resp 26   ??? Ht 5\' 4"  (1.626 m)   ??? Wt 121 lb   ??? BMI 20.76 kg/m2   ??? SpO2 97%     SpO2 Readings from Last 6 Encounters:   01/04/12 97%   12/19/11 100%   10/19/11 98%   09/18/11 100%   09/13/11 100%   05/17/11 100%        No intake or output data in the 24 hours ending 01/05/12 0039         Exam:     Physical Exam:    Gen:  no acute distress  HEENT:  Pink conjunctivae, PERRL, hearing intact to voice, moist mucous membranes  Neck:  Supple, without masses,  thyroid non-tender  Resp:  No accessory muscle use, clear breath sounds without wheezes rales or rhonchi  Card:  Tachycardia, No murmurs, normal S1, S2 without thrills, bruits or peripheral edema  Abd:  Soft, non-tender, non-distended, normoactive bowel sounds are present, no palpable organomegaly  Lymph:  No cervical adenopathy  Musc:  No cyanosis or clubbing  Skin:  No rashes or ulcers, skin turgor is good  Neuro:  Cranial nerves 3-12 are grossly intact, grip strength is 5/5 bilaterally, dorsi / plantarflexion strength is 5/5 bilaterally, follows commands appropriately  Psych:  Alert with good insight.  Oriented to person, place, and time       Labs:    Recent Labs      01/04/12   2158   WBC  9.6   HGB  11.5   HCT  33.2*   PLT  230     Recent Labs      01/04/12   2158   NA  137   K  3.2*   CL  103   CO2  24   GLU  114*   BUN  9   CREA  0.88   CA  8.9   MG  1.2*     No results found for this basename: glpoc     No results found for this basename: PH, PCO2, PO2, HCO3, FIO2,  in the last 72 hours  No results found for this basename: INR,  in the last 72 hours         Assessment/Plan:       Shortness of breath (01/05/2012): unclear  Etiology. CXR negative. CTA chest negative for PE. No fever, or leukocytosis. ? Viral bronchitis    Hypokalemia (01/05/2012): no obvious reason, will replete? Eating disorder? Laxative abuse    Hypomagnesemia (01/05/2012). Will replete    Tachycardia: may be secondary to hypokalemia and hypomag. Will replete and  monitor. Will check TSH      Total time spent with patient: 83 Minutes                  Care Plan discussed with: Patient and Family    Discussed:  Care Plan    Prophylaxis:  Lovenox    Probable Disposition:  Home w/Family           ___________________________________________________    Attending Physician: Raechel Chute, MD

## 2012-01-05 NOTE — ED Notes (Signed)
Patient ambulatory to restroom with steady gait.

## 2012-01-05 NOTE — ED Notes (Signed)
[place] MD at bedside.

## 2012-01-05 NOTE — Progress Notes (Signed)
01/05/12 0157   Vital Signs   Temp 98.4 ??F (36.9 ??C)   Temp Source Oral   Pulse (Heart Rate) ! 114   Heart Rate Source Monitor   Resp Rate 20   O2 Sat (%) 97 %   Level of Consciousness Alert   BP ! 146/93 mmHg   MAP (Calculated) 111   BP 1 Method Automatic   BP 1 Location Left arm   BP Patient Position At rest   MEWS Score 3   Pain 1   Pain Scale 1 Numeric (0 - 10)   Pain Intensity 1 0   Oxygen Therapy   O2 Device Room air   Patient Observation   Patient Turned Turns self   Hourly Rounds Yes     Patient MEWS 3.  Received several calls from telemetry that patient 120's to 140's tachycardic.  Called Dr. Abelino Derrick and he requested bolus of 500 mL NS then transfer to telemetry floor.  Called [facility] Supervisor and she assigned patient to [facility] Rm [id number].

## 2012-01-05 NOTE — ED Notes (Signed)
Patient ambulatory to rest room with steady gait.

## 2012-01-05 NOTE — Other (Signed)
TRANSFER - OUT REPORT:    Verbal report given to Greg RN(name) on JERUSALEN MATEJA  being transferred to 201-02(unit) for routine progression of care       Report consisted of patient???s Situation, Background, Assessment and   Recommendations(SBAR).     Information from the following report(s) SBAR, ED Summary, [facility] and Recent Results was reviewed with the receiving nurse.    Opportunity for questions and clarification was provided.

## 2012-01-05 NOTE — Progress Notes (Signed)
Chart reviewed for medical necessity, will follow for potential discharge needs.  Tabor Bartram, RN

## 2012-01-05 NOTE — Progress Notes (Signed)
Bedside and Verbal shift change report given to Jane, RN (oncoming nurse) by Kristin Aponte, RN (offgoing nurse).  Report given with SBAR, Kardex, Intake/Output, MAR and Recent Results.

## 2012-01-06 LAB — CULTURE, URINE
Colonies Counted: <1000
Colony Count: <1000
Culture result:: NO GROWTH
Culture: NO GROWTH

## 2012-01-06 LAB — METABOLIC PANEL, COMPREHENSIVE
A-G Ratio: 0.9 — ABNORMAL LOW (ref 1.1–2.2)
ALT (SGPT): 108 U/L — ABNORMAL HIGH (ref 12–78)
AST (SGOT): 147 U/L — ABNORMAL HIGH (ref 15–37)
Albumin: 3 g/dL — ABNORMAL LOW (ref 3.5–5.0)
Alk. phosphatase: 112 U/L (ref 50–136)
Anion gap: 9 mmol/L (ref 5–15)
BUN/Creatinine ratio: 6 — ABNORMAL LOW (ref 12–20)
BUN: 3 MG/DL — ABNORMAL LOW (ref 6–20)
Bilirubin, total: 0.4 MG/DL (ref 0.2–1.0)
CO2: 22 MMOL/L (ref 21–32)
Calcium: 7.8 MG/DL — ABNORMAL LOW (ref 8.5–10.1)
Chloride: 109 MMOL/L — ABNORMAL HIGH (ref 97–108)
Creatinine: 0.48 MG/DL (ref 0.45–1.15)
GFR est AA: >60 mL/min/{1.73_m2} (ref >60)
GFR est non-AA: >60 mL/min/{1.73_m2} (ref >60)
Globulin: 3.2 g/dL (ref 2.0–4.0)
Glucose: 115 MG/DL — ABNORMAL HIGH (ref 65–100)
Potassium: 3.2 MMOL/L — ABNORMAL LOW (ref 3.5–5.1)
Protein, total: 6.2 g/dL — ABNORMAL LOW (ref 6.4–8.2)
Sodium: 140 MMOL/L (ref 136–145)

## 2012-01-06 LAB — CBC WITH AUTOMATED DIFF
ABS. BASOPHILS: 0 10*3/uL (ref 0.0–0.1)
ABS. EOSINOPHILS: 0.3 10*3/uL (ref 0.0–0.4)
ABS. LYMPHOCYTES: 0.9 10*3/uL (ref 0.8–3.5)
ABS. MONOCYTES: 0.3 10*3/uL (ref 0.0–1.0)
ABS. NEUTROPHILS: 2 10*3/uL (ref 1.8–8.0)
BASOPHILS: 0 % (ref 0–1)
EOSINOPHILS: 8 % — ABNORMAL HIGH (ref 0–7)
HCT: 28.2 % — ABNORMAL LOW (ref 35.0–47.0)
HGB: 9.7 g/dL — ABNORMAL LOW (ref 11.5–16.0)
LYMPHOCYTES: 27 % (ref 12–49)
MCH: 30.1 PG (ref 26.0–34.0)
MCHC: 34.4 g/dL (ref 30.0–36.5)
MCV: 87.6 FL (ref 80.0–99.0)
MONOCYTES: 8 % (ref 5–13)
NEUTROPHILS: 57 % (ref 32–75)
PLATELET: 183 10*3/uL (ref 150–400)
RBC: 3.22 M/uL — ABNORMAL LOW (ref 3.80–5.20)
RDW: 13.4 % (ref 11.5–14.5)
WBC: 3.5 10*3/uL — ABNORMAL LOW (ref 3.6–11.0)

## 2012-01-06 LAB — MAGNESIUM: Magnesium: 1.5 MG/DL — ABNORMAL LOW (ref 1.6–2.4)

## 2012-01-06 MED ADMIN — albuterol (PROVENTIL VENTOLIN) nebulizer solution 1.25 mg: RESPIRATORY_TRACT | @ 13:00:00 | NDC 00487950101

## 2012-01-06 MED ADMIN — 0.9% sodium chloride infusion: INTRAVENOUS | @ 08:00:00 | NDC 00409798309

## 2012-01-06 MED ADMIN — sodium chloride (NS) flush 5-10 mL: INTRAVENOUS | @ 18:00:00 | NDC 87701099893

## 2012-01-06 MED ADMIN — albuterol (PROVENTIL VENTOLIN) nebulizer solution 1.25 mg: RESPIRATORY_TRACT | @ 01:00:00 | NDC 00487950101

## 2012-01-06 MED ADMIN — benazepril (LOTENSIN) tablet 10 mg: ORAL | @ 14:00:00 | NDC 00093512501

## 2012-01-06 MED ADMIN — potassium chloride SR (KLOR-CON 10) tablet 40 mEq: ORAL | @ 14:00:00 | NDC 00245004189

## 2012-01-06 MED ADMIN — albuterol (PROVENTIL VENTOLIN) nebulizer solution 1.25 mg: RESPIRATORY_TRACT | @ 19:00:00 | NDC 00487950101

## 2012-01-06 MED ADMIN — amLODIPine (NORVASC) tablet 5 mg: ORAL | @ 14:00:00 | NDC 59762153002

## 2012-01-06 MED ADMIN — 0.9% sodium chloride infusion: INTRAVENOUS | @ 18:00:00 | NDC 00409798309

## 2012-01-06 MED ADMIN — sodium chloride (NS) flush 5-10 mL: INTRAVENOUS | @ 03:00:00 | NDC 82903065462

## 2012-01-06 MED ADMIN — guaiFENesin SR (MUCINEX) tablet 600 mg: ORAL | @ 02:00:00 | NDC 45802049878

## 2012-01-06 MED ADMIN — albuterol (PROVENTIL VENTOLIN) nebulizer solution 1.25 mg: RESPIRATORY_TRACT | @ 07:00:00 | NDC 00487950101

## 2012-01-06 MED ADMIN — sodium chloride (NS) 0.9 % flush: @ 15:00:00 | NDC 82903065462

## 2012-01-06 MED ADMIN — methylPREDNISolone (MEDROL) tablet 32 mg: ORAL | @ 18:00:00 | NDC 00603459321

## 2012-01-06 MED ADMIN — magnesium sulfate 2 g/50 ml IVPB (premix or compounded): INTRAVENOUS | @ 14:00:00 | NDC 00409672924

## 2012-01-06 MED ADMIN — enoxaparin (LOVENOX) injection 40 mg: SUBCUTANEOUS | @ 08:00:00 | NDC 00075062040

## 2012-01-06 MED ADMIN — sodium chloride (NS) 0.9 % flush: @ 18:00:00 | NDC 87701099893

## 2012-01-06 MED ADMIN — sodium chloride (NS) flush 5-10 mL: INTRAVENOUS | @ 19:00:00 | NDC 87701099893

## 2012-01-06 MED ADMIN — sodium chloride (NS) flush 5-10 mL: INTRAVENOUS | @ 11:00:00 | NDC 82903065462

## 2012-01-06 MED ADMIN — guaiFENesin SR (MUCINEX) tablet 600 mg: ORAL | @ 14:00:00 | NDC 45802049878

## 2012-01-06 MED FILL — BD POSIFLUSH NORMAL SALINE 0.9 % INJECTION SYRINGE: INTRAMUSCULAR | Qty: 10

## 2012-01-06 MED FILL — AMLODIPINE 5 MG TAB: 5 mg | ORAL | Qty: 1

## 2012-01-06 MED FILL — MAGNESIUM SULFATE 2 GRAM/50 ML IVPB: 2 gram/50 mL (4 %) | INTRAVENOUS | Qty: 50

## 2012-01-06 MED FILL — BENAZEPRIL 10 MG TAB: 10 mg | ORAL | Qty: 1

## 2012-01-06 MED FILL — LOVENOX 40 MG/0.4 ML SUBCUTANEOUS SYRINGE: 40 mg/0.4 mL | SUBCUTANEOUS | Qty: 0.4

## 2012-01-06 MED FILL — MUCINEX 600 MG TABLET, EXTENDED RELEASE: 600 mg | ORAL | Qty: 1

## 2012-01-06 MED FILL — ALBUTEROL SULFATE 0.083 % (0.83 MG/ML) SOLN FOR INHALATION: 2.5 mg /3 mL (0.083 %) | RESPIRATORY_TRACT | Qty: 1

## 2012-01-06 MED FILL — METHYLPREDNISOLONE 4 MG TAB: 4 mg | ORAL | Qty: 8

## 2012-01-06 MED FILL — K-TAB 10 MEQ TABLET,EXTENDED RELEASE: 10 mEq | ORAL | Qty: 4

## 2012-01-06 NOTE — Progress Notes (Addendum)
Vanessa Rathke, MD  [facility]berry [phone]  After 7pm please call operator for physician on call      Hospitalist Progress Note            Daily Progress Note: 01/06/2012    Vanessa Valrie Hart, MD    Assessment/Plan:   1. Shortness of breath (01/05/2012): Possibly reactive airway dz cont nebs/ mucinex CXR negative. CTA chest negative for PE. No fever, or leukocytosis. Will start medrol as allergic to prednisone ,( d/w pulm)   2. UTI? - started on levaquin but ( got 1 dose) but d/c  Today culture negative and  for abnormal LFT's will also get USG of RUQ  3. Replete lytes and recheck tomorrow  4. Tachycardia: may be secondary to hypokalemia and hypomag. Will replete and monitor.   5. HTN: cont amlodipine and benzapril  5. DVT and GI ppx       Subjective:   The patient is without any acute complaints at this time.      Review of Systems:   Review of Systems:    Symptom Y/N Comments  Symptom Y/N Comments   Fever/Chills y   Chest Pain n    Poor Appetite n   Edema n    Cough y   Abdominal Pain n    Sputum    Joint Pain     SOB/DOE    Pruritis/Rash     Nausea/vomit    Tolerating PT/OT     Diarrhea    Tolerating Diet     Constipation    Other       Could not obtain due to:        Objective:   Physical Exam:     BP 145/95   Pulse 103   Temp(Src) 98.1 ??F (36.7 ??C)   Resp 18   Ht 5\' 4"  (1.626 m)   Wt 121 lb   BMI 20.76 kg/m2   SpO2 99%   LMP 12/27/2011   O2 Device: Room air    Temp (24hrs), Avg:98.4 ??F (36.9 ??C), Min:98 ??F (36.7 ??C), Max:99.2 ??F (37.3 ??C)    11/09 0700 - 11/09 1859  In: 240 [P.O.:240]  Out: -    11/07 1900 - 11/09 0659  In: 640 [P.O.:640]  Out: -       PHYSICAL EXAM:  General: WD, WN. Alert, cooperative, no acute distress????  EENT:  EOMI. Anicteric sclerae. MMM  Resp:              Few  Wheezing+  CV:  Regular  rhythm,??  No edema  GI:  Soft,??+Bowel sounds, no HSM  Neurologic:?? CN 2-12 gi, Alert and oriented X 3.          Data Review:       Recent Days:  Recent Labs      01/06/12    0306  01/04/12   2158   WBC  3.5*  9.6   HGB  9.7*  11.5   HCT  28.2*  33.2*   PLT  183  230     Recent Labs      01/06/12   0306  01/04/12   2158   NA  140  137   K  3.2*  3.2*   CL  109*  103   CO2  22  24   GLU  115*  114*   BUN  3*  9   CREA  0.48  0.88   CA  7.8*  8.9   MG  1.5*  1.2*   ALB  3.0*   --    TBIL  0.4   --    SGOT  147*   --      No results found for this basename: PH, PCO2, PO2, HCO3, FIO2,  in the last 72 hours    24 Hour Results:  Recent Results (from the past 24 hour(s))   METABOLIC PANEL, COMPREHENSIVE    Collection Time    01/06/12  3:06 AM       Result Value Range    Sodium 140  136 - 145 MMOL/L    Potassium 3.2 (*) 3.5 - 5.1 MMOL/L    Chloride 109 (*) 97 - 108 MMOL/L    CO2 22  21 - 32 MMOL/L    Anion gap 9  5 - 15 mmol/L    Glucose 115 (*) 65 - 100 MG/DL    BUN 3 (*) 6 - 20 MG/DL    Creatinine 4.09  8.11 - 1.15 MG/DL    BUN/Creatinine ratio 6 (*) 12 - 20      GFR est AA >60  >60 ml/min/1.72m2    GFR est non-AA >60  >60 ml/min/1.41m2    Calcium 7.8 (*) 8.5 - 10.1 MG/DL    Bilirubin, total 0.4  0.2 - 1.0 MG/DL    ALT 914 (*) 12 - 78 U/L    AST 147 (*) 15 - 37 U/L    Alk. phosphatase 112  50 - 136 U/L    Protein, total 6.2 (*) 6.4 - 8.2 g/dL    Albumin 3.0 (*) 3.5 - 5.0 g/dL    Globulin 3.2  2.0 - 4.0 g/dL    A-G Ratio 0.9 (*) 1.1 - 2.2     CBC WITH AUTOMATED DIFF    Collection Time    01/06/12  3:06 AM       Result Value Range    WBC 3.5 (*) 3.6 - 11.0 K/uL    RBC 3.22 (*) 3.80 - 5.20 M/uL    HGB 9.7 (*) 11.5 - 16.0 g/dL    HCT 78.2 (*) 95.6 - 47.0 %    MCV 87.6  80.0 - 99.0 FL    MCH 30.1  26.0 - 34.0 PG    MCHC 34.4  30.0 - 36.5 g/dL    RDW 21.3  08.6 - 57.8 %    PLATELET 183  150 - 400 K/uL    NEUTROPHILS 57  32 - 75 %    LYMPHOCYTES 27  12 - 49 %    MONOCYTES 8  5 - 13 %    EOSINOPHILS 8 (*) 0 - 7 %    BASOPHILS 0  0 - 1 %    ABS. NEUTROPHILS 2.0  1.8 - 8.0 K/UL    ABS. LYMPHOCYTES 0.9  0.8 - 3.5 K/UL    ABS. MONOCYTES 0.3  0.0 - 1.0 K/UL    ABS. EOSINOPHILS 0.3  0.0 - 0.4 K/UL    ABS.  BASOPHILS 0.0  0.0 - 0.1 K/UL   MAGNESIUM    Collection Time    01/06/12  3:06 AM       Result Value Range    Magnesium 1.5 (*) 1.6 - 2.4 MG/DL       Problem List:  Problem List as of 01/06/2012 Date Reviewed: 2011/06/09        Codes Class Noted - Resolved    *Shortness of breath 786.05  01/05/2012 - Present        Hypokalemia 276.8  01/05/2012 - Present        Hypomagnesemia 275.2  01/05/2012 - Present        Dizziness 780.4  06/24/2009 - Present        HTN (hypertension) 401.9  06/24/2009 - Present        Vertigo 780.4  06/24/2009 - Present        Pharyngitis 462  06/24/2009 - Present              Medications reviewed- Reviewed   Current Facility-Administered Medications   Medication Dose Route Frequency   ??? [COMPLETED] magnesium sulfate 2 g/50 ml IVPB (premix or compounded)  2 g IntraVENous ONCE   ??? potassium chloride SR (KLOR-CON 10) tablet 40 mEq  40 mEq Oral DAILY   ??? sodium chloride (NS) 0.9 % flush       ??? methylPREDNISolone (MEDROL) tablet 32 mg  32 mg Oral DAILY WITH BREAKFAST   ??? benazepril (LOTENSIN) tablet 10 mg  10 mg Oral DAILY   ??? ibuprofen (MOTRIN) tablet 800 mg  800 mg Oral Q6H PRN   ??? famotidine (PEPCID) tablet 20 mg  20 mg Oral BID PRN   ??? sodium chloride (NS) flush 5-10 mL  5-10 mL IntraVENous Q8H   ??? sodium chloride (NS) flush 5-10 mL  5-10 mL IntraVENous PRN   ??? ondansetron (ZOFRAN) injection 4 mg  4 mg IntraVENous Q6H PRN   ??? enoxaparin (LOVENOX) injection 40 mg  40 mg SubCUTAneous Q24H   ??? amLODIPine (NORVASC) tablet 5 mg  5 mg Oral DAILY   ??? 0.9% sodium chloride infusion  125 mL/hr IntraVENous CONTINUOUS   ??? butalbital-acetaminophen (PHRENILIN) 50-325 mg tablet 1 Tab  1 Tab Oral Q6H PRN   ??? albuterol (PROVENTIL VENTOLIN) nebulizer solution 1.25 mg  1.25 mg Nebulization Q6H RT   ??? guaiFENesin SR (MUCINEX) tablet 600 mg  600 mg Oral Q12H       Code Status:  Full Code x   DNR/DNI    ______________________________________________________________________  Total NON critical care TIME:    Minutes    Total  CRITICAL CARE TIME Spent:   Minutes      Comments   >50% of visit spent in counseling and coordination of care     ______________________________________________________________________  Procedures: see electronic medical records for all procedures/Xrays and details which were not copied into this note but were reviewed prior to creation of Plan.          Total time spent with patient: 30 minutes.    Vanessa Rathke, MD

## 2012-01-06 NOTE — Progress Notes (Signed)
Bedside shift change report given to Lisa RN (oncoming nurse) by Ashley RN (offgoing nurse).  Report given with SBAR, Kardex, MAR and Recent Results.

## 2012-01-06 NOTE — Progress Notes (Signed)
01/06/2012 02:22P Chart reviewed for medical necessity. CM to follow for potential discharge needs. A Gannon RNCM

## 2012-01-06 NOTE — Progress Notes (Signed)
Bedside shift change report given to Ashley, RN (oncoming nurse) by Katherine, RN (offgoing nurse).  Report given with SBAR and Kardex.

## 2012-01-07 LAB — CBC W/O DIFF
HCT: 27.3 % — ABNORMAL LOW (ref 35.0–47.0)
HGB: 9.4 g/dL — ABNORMAL LOW (ref 11.5–16.0)
MCH: 29.8 PG (ref 26.0–34.0)
MCHC: 34.4 g/dL (ref 30.0–36.5)
MCV: 86.7 FL (ref 80.0–99.0)
PLATELET: 192 10*3/uL (ref 150–400)
RBC: 3.15 M/uL — ABNORMAL LOW (ref 3.80–5.20)
RDW: 13.7 % (ref 11.5–14.5)
WBC: 4.3 10*3/uL (ref 3.6–11.0)

## 2012-01-07 LAB — METABOLIC PANEL, COMPREHENSIVE
A-G Ratio: 0.8 — ABNORMAL LOW (ref 1.1–2.2)
ALT (SGPT): 165 U/L — ABNORMAL HIGH (ref 12–78)
AST (SGOT): 193 U/L — ABNORMAL HIGH (ref 15–37)
Albumin: 2.8 g/dL — ABNORMAL LOW (ref 3.5–5.0)
Alk. phosphatase: 118 U/L (ref 50–136)
Anion gap: 10 mmol/L (ref 5–15)
BUN/Creatinine ratio: 13 (ref 12–20)
BUN: 5 MG/DL — ABNORMAL LOW (ref 6–20)
Bilirubin, total: 0.3 MG/DL (ref 0.2–1.0)
CO2: 20 MMOL/L — ABNORMAL LOW (ref 21–32)
Calcium: 7.9 MG/DL — ABNORMAL LOW (ref 8.5–10.1)
Chloride: 111 MMOL/L — ABNORMAL HIGH (ref 97–108)
Creatinine: 0.4 MG/DL — ABNORMAL LOW (ref 0.45–1.15)
GFR est AA: >60 mL/min/{1.73_m2} (ref >60)
GFR est non-AA: >60 mL/min/{1.73_m2} (ref >60)
Globulin: 3.4 g/dL (ref 2.0–4.0)
Glucose: 117 MG/DL — ABNORMAL HIGH (ref 65–100)
Potassium: 3.6 MMOL/L (ref 3.5–5.1)
Protein, total: 6.2 g/dL — ABNORMAL LOW (ref 6.4–8.2)
Sodium: 141 MMOL/L (ref 136–145)

## 2012-01-07 MED ADMIN — albuterol (PROVENTIL VENTOLIN) nebulizer solution 1.25 mg: RESPIRATORY_TRACT | @ 06:00:00 | NDC 00487950101

## 2012-01-07 MED ADMIN — benazepril (LOTENSIN) tablet 10 mg: ORAL | @ 14:00:00 | NDC 51079014501

## 2012-01-07 MED ADMIN — diphenhydrAMINE (BENADRYL) capsule 50 mg: ORAL | @ 04:00:00 | NDC 00904530661

## 2012-01-07 MED ADMIN — sodium chloride (NS) flush 5-10 mL: INTRAVENOUS | @ 11:00:00 | NDC 82903065462

## 2012-01-07 MED ADMIN — guaiFENesin SR (MUCINEX) tablet 600 mg: ORAL | @ 04:00:00 | NDC 45802049878

## 2012-01-07 MED ADMIN — amLODIPine (NORVASC) tablet 5 mg: ORAL | @ 14:00:00 | NDC 50268008115

## 2012-01-07 MED ADMIN — enoxaparin (LOVENOX) injection 40 mg: SUBCUTANEOUS | @ 08:00:00 | NDC 00075062040

## 2012-01-07 MED ADMIN — potassium chloride SR (KLOR-CON 10) tablet 40 mEq: ORAL | @ 14:00:00 | NDC 00245004189

## 2012-01-07 MED ADMIN — guaiFENesin SR (MUCINEX) tablet 600 mg: ORAL | @ 14:00:00 | NDC 45802049878

## 2012-01-07 MED ADMIN — albuterol (PROVENTIL VENTOLIN) nebulizer solution 1.25 mg: RESPIRATORY_TRACT | @ 13:00:00 | NDC 00487950101

## 2012-01-07 MED ADMIN — 0.9% sodium chloride infusion: INTRAVENOUS | @ 11:00:00 | NDC 00409798348

## 2012-01-07 MED ADMIN — methylPREDNISolone (MEDROL) tablet 32 mg: ORAL | @ 12:00:00 | NDC 00603459321

## 2012-01-07 MED ADMIN — albuterol (PROVENTIL VENTOLIN) nebulizer solution 1.25 mg: RESPIRATORY_TRACT | @ 02:00:00 | NDC 00487950101

## 2012-01-07 MED ADMIN — 0.9% sodium chloride infusion: INTRAVENOUS | @ 02:00:00 | NDC 00409798309

## 2012-01-07 MED FILL — ALBUTEROL SULFATE 0.083 % (0.83 MG/ML) SOLN FOR INHALATION: 2.5 mg /3 mL (0.083 %) | RESPIRATORY_TRACT | Qty: 1

## 2012-01-07 MED FILL — BENAZEPRIL 10 MG TAB: 10 mg | ORAL | Qty: 1

## 2012-01-07 MED FILL — K-TAB 10 MEQ TABLET,EXTENDED RELEASE: 10 mEq | ORAL | Qty: 4

## 2012-01-07 MED FILL — LOVENOX 40 MG/0.4 ML SUBCUTANEOUS SYRINGE: 40 mg/0.4 mL | SUBCUTANEOUS | Qty: 0.4

## 2012-01-07 MED FILL — MUCINEX 600 MG TABLET, EXTENDED RELEASE: 600 mg | ORAL | Qty: 1

## 2012-01-07 MED FILL — AMLODIPINE 5 MG TAB: 5 mg | ORAL | Qty: 1

## 2012-01-07 MED FILL — METHYLPREDNISOLONE 4 MG TAB: 4 mg | ORAL | Qty: 8

## 2012-01-07 MED FILL — BD POSIFLUSH NORMAL SALINE 0.9 % INJECTION SYRINGE: INTRAMUSCULAR | Qty: 10

## 2012-01-07 MED FILL — DIPHENHYDRAMINE 25 MG CAP: 25 mg | ORAL | Qty: 2

## 2012-01-07 NOTE — Consults (Signed)
[facility]  [address]  [place], [place] [address]  [phone]            GI CONSULTATION NOTE    NAME: Vanessa Kramer   DOB:  [birth year]   MRN:  [id number]     Date/Time:  01/07/2012 11:53 AM      Assessment:   1. Abnormal LFT-  These are non specific and may be related to her acute respiratory infection.  The U/S is negative and the exam is negative. She has acute viral serology pending.  I will follow up as an outpatient- LFT in 1 week and an appointment in 10 days.     Plan:   Agree with discharge I will work up if needed as an outpatient.    CHIEF COMPLAINT:      HISTORY OF PRESENT ILLNESS:     Vanessa Kramer is a 41 y.o.  African American female whom presents with an admission for upper respiratory infection and bronchospasm.   She  Has had a hx of elevated LFT during pregnancy that returned to normal. No IV drug use or new medications. No abd pain. Her AST/ALT= 193/168 with other LFT being normal.       Past Medical History   Diagnosis Date   ??? GERD (gastroesophageal reflux disease)    ??? Hypertension    ??? Pap smear July 29, 2007      Past Surgical History   Procedure Laterality Date   ??? Hx hernia repair  1998   ??? Hx tubal ligation  2004   ??? Hx gyn       D&C     History   Substance Use Topics   ??? Smoking status: Never Smoker    ??? Smokeless tobacco: Never Used   ??? Alcohol Use: 1.5 oz/week     3 Glasses of wine per week      Comment: occ      Family History   Problem Relation Age of Onset   ??? Hypertension Mother    ??? Hypertension Father       Allergies   Allergen Reactions   ??? Compazine (Prochlorperazine Edisylate) Other (comments)     Lock muscle   ??? Dilaudid (Hydromorphone (Pf)) Itching   ??? Percocet (Oxycodone-Acetaminophen) Itching   ??? Prednisone Swelling     Throat swelling      Prior to Admission medications    Medication Sig Start Date End Date Taking? Authorizing Provider   albuterol (PROVENTIL HFA, VENTOLIN HFA) 90 mcg/actuation inhaler Take 2 Puffs by inhalation every six (6) hours as  needed for Wheezing. 01/07/12  Yes Lucky Rathke, MD   methylPREDNISolone (MEDROL, PAK,) 4 mg tablet Per dose pack instructions 01/07/12  Yes Lucky Rathke, MD   guaiFENesin SR (MUCINEX) 600 mg SR tablet Take 1 Tab by mouth every twelve (12) hours. 01/07/12  Yes Lucky Rathke, MD   promethazine (PHENERGAN) 25 mg tablet Take 1 Tab by mouth every six (6) hours as needed for Nausea. 09/18/11  Yes Chase Picket, MD   acetaminophen-butalbital (BUPAP) 50-650 mg Tab Take 1 Each by mouth every six (6) hours as needed. 09/18/11  Yes Chase Picket, MD   amLODIPine-benazepril (LOTREL) 5-10 mg per capsule Take 1 Cap by mouth daily. 09/15/11  Yes Chase Picket, MD   promethazine (PHENERGAN) 25 mg tablet Take 1 Tab by mouth every six (6) hours as needed for Nausea. 09/13/11  Yes M Laurier Nancy, MD  ibuprofen (MOTRIN) 800 mg tablet Take 1 Tab by mouth every six (6) hours as needed for Pain. 04/24/11  Yes Chase Picket, MD   RANITIDINE HCL (ZANTAC PO) Take  by mouth two (2) times daily as needed.   Yes Historical Provider   dicyclomine (BENTYL) 10 mg capsule Take 10 mg by mouth 4 times daily (before meals and nightly).   Yes Phys Other, MD   meclizine (ANTIVERT) 25 mg tablet Take 1 Tab by mouth three (3) times daily as needed for Dizziness. 09/13/11   Barnie Del, MD     REVIEW OF SYSTEMS:     []          Unable to obtain  ROS due to  []  mental status change      []   sedated                []  intubated   [x]       Total of 12 systems reviewed as follows:  Constitutional: negative fever, negative chills, negative weight loss  Eyes:   negative visual changes  ENT:   negative sore throat, tongue or lip swelling  Respiratory:  negative cough, negative dyspnea  Cards:  negative for chest pain, palpitations, lower extremity edema  GI:   See HPI  GU:  negative for frequency, dysuria  Integument:  negative for rash and pruritus  Heme:  negative for easy bruising and gum/nose bleeding  Musculoskel: negative for myalgias,  back pain and  muscle weakness  Neuro:  negative for headaches, dizziness, vertigo  Psych:  negative for feelings of anxiety, depression   Travel?: none    Objective:   VITALS:    Visit Vitals   Item Reading   ??? BP 144/95   ??? Pulse 120   ??? Temp 97.5 ??F (36.4 ??C)   ??? Resp 18   ??? Ht 5\' 4"  (1.626 m)   ??? Wt 54.885 kg (121 lb)   ??? BMI 20.76 kg/m2   ??? SpO2 100%     PHYSICAL EXAM:  Gen:  [x]  WD      [x]  WN      []  cachectic      []  thin       [] obese       []   disheveled             []      ill apearing         []  Critical       []   Chronic            [x]  No acute distress    HEENT:   [x]    NC/AT/PERRLA/EOMI              []   Icteric           [x]   Nonicteric    [x]   pink conjunctivae           []  pale conjunctivae                  PERRL  [x]   yes        []   no              [x]   moist mucosa          []   dry mucosa    hearing intact to voice:[x]    yes               []      No  NECK:   Supple: [x]  Yes         []  no               Masses:  []    yes                []   No               Thyroid : [x]  non tender             []   tender    RESP:   []  CTA bialterally/no wheezing/rhonchi/rales/crackles    []    clear bilaterally            []   wheezing         [x]   rhonchi       []  crackles     use of accessory muscles: [x]  Yes                 []  no    CARD:   [x]          regular rate and rhythm/No murmurs/rubs/gallops     Murmur : []  yes          [] no                 Rubs:  []  yes             []  No                              Gallops: []   Yes                []  no    Rate:   []   Regular          []  irregular          Carotid bruits: []  Yes         []  No     Left                 LE edema:  []  yes         [] no           JVP:  []    yes           []  no    ABD:    [x]         soft/non distended/non tender/+bowel sounds/no hepatosplenomegaly    []    Rigid       Tenderness: []  Yes            []  no                                                                             Liver enlargement : []   Yes               []   no                Spleen  enlargement:  []   Yes              []   no    Distended: []  Yes      []  no     Bowel sounds:  []   normal         [] hypoactive         []  hyperactive    LYMPH:    Neck: [] Yes       [] no                     Axillae: []  Yes        [x]     no    MSKL:    [x]     Normal         []   Spastic            []   Flaccid        []   Cog wheel      SKIN:   Rashes:     []   yes   []   no          Ulcers:    []  yes       []  no               Skin turgor:    []  good         []  poor                Cyanosis/clubbing:      []  Yes           []  no    NEUR:   [x]  grossly intact                PSYCH:    Alert and Oriented to:  [x]  person         [x]   place        [x]  time                      LAB DATA REVIEWED:    Recent Results (from the past 24 hour(s))   METABOLIC PANEL, COMPREHENSIVE    Collection Time    01/07/12  3:14 AM       Result Value Range    Sodium 141  136 - 145 MMOL/L    Potassium 3.6  3.5 - 5.1 MMOL/L    Chloride 111 (*) 97 - 108 MMOL/L    CO2 20 (*) 21 - 32 MMOL/L    Anion gap 10  5 - 15 mmol/L    Glucose 117 (*) 65 - 100 MG/DL    BUN 5 (*) 6 - 20 MG/DL    Creatinine 4.09 (*) 0.45 - 1.15 MG/DL    BUN/Creatinine ratio 13  12 - 20      GFR est AA >60  >60 ml/min/1.38m2    GFR est non-AA >60  >60 ml/min/1.22m2    Calcium 7.9 (*) 8.5 - 10.1 MG/DL    Bilirubin, total 0.3  0.2 - 1.0 MG/DL    ALT 811 (*) 12 - 78 U/L    AST 193 (*) 15 - 37 U/L    Alk. phosphatase 118  50 - 136 U/L    Protein, total 6.2 (*) 6.4 - 8.2 g/dL    Albumin 2.8 (*) 3.5 - 5.0 g/dL    Globulin 3.4  2.0 - 4.0 g/dL    A-G Ratio 0.8 (*) 1.1 - 2.2     CBC W/O DIFF    Collection Time    01/07/12  3:14 AM       Result Value Range    WBC 4.3  3.6 - 11.0 K/uL    RBC 3.15 (*) 3.80 - 5.20 M/uL  HGB 9.4 (*) 11.5 - 16.0 g/dL    HCT 16.1 (*) 09.6 - 47.0 %    MCV 86.7  80.0 - 99.0 FL    MCH 29.8  26.0 - 34.0 PG    MCHC 34.4  30.0 - 36.5 g/dL    RDW 04.5  40.9 - 81.1 %    PLATELET 192  150 - 400 K/uL       IMAGING RESULTS:   []           I have personally reviewed the actual :   []   CXR             []   CT scan    CXR 01/07/2012:    CT  01/07/2012:  ________________________________________________________________________    Pierce Crane, MD   01/07/2012  11:53 AM

## 2012-01-07 NOTE — Progress Notes (Signed)
Bedside shift change report given to Will (oncoming nurse) by Lisa (offgoing nurse).  Report given with SBAR, Kardex, Intake/Output, MAR and Recent Results.

## 2012-01-07 NOTE — Progress Notes (Signed)
01/07/12 0839   Vital Signs   Temp 97.5 ??F (36.4 ??C)   Temp Source Oral   Pulse (Heart Rate) ! 120   Heart Rate Source Monitor   Resp Rate 18   O2 Sat (%) 100 %   Level of Consciousness Alert   BP ! 144/95 mmHg   MAP (Calculated) 111   BP 1 Method Automatic   BP 1 Location Right arm   BP Patient Position Sitting   MEWS Score 3   High Blood pressure & tachycardia.  Medicated.  Will continue to monitor

## 2012-01-07 NOTE — Progress Notes (Signed)
Lucky Rathke, MD  Blackberry [phone]  After 7pm please call operator for physician on call      Hospitalist Progress Note            Daily Progress Note: 01/07/2012    [name], MD    Assessment/Plan:   1. Shortness of breath (01/05/2012): Possibly reactive airway dz - improved with nebs/ mucinex CXR negative. CTA chest negative for PE. No fever, or leukocytosis. Will d/c on medrol as allergic to prednisone ,( d/w pulm) and inhalar  2. UTI? - started on levaquin but ( got 1 dose) but d/c  Today culture negative and  for abnormal LFT's will also get USG of RUQ  3. GI consult will need out pt follow up   3. Replete lytes   4. Tachycardia: resolved  5. HTN: cont amlodipine and benzapril  5. DVT and GI ppx       Subjective:   The patient is without any acute complaints at this time.      Review of Systems:   Review of Systems:    Symptom Y/N Comments  Symptom Y/N Comments   Fever/Chills y   Chest Pain n    Poor Appetite n   Edema n    Cough y   Abdominal Pain n    Sputum    Joint Pain     SOB/DOE    Pruritis/Rash     Nausea/vomit    Tolerating PT/OT     Diarrhea    Tolerating Diet     Constipation    Other       Could not obtain due to:        Objective:   Physical Exam:     BP 144/95   Pulse 120   Temp(Src) 97.5 ??F (36.4 ??C)   Resp 18   Ht 5\' 4"  (1.626 m)   Wt 121 lb   BMI 20.76 kg/m2   SpO2 100%   LMP 12/27/2011   O2 Device: Room air    Temp (24hrs), Avg:98 ??F (36.7 ??C), Min:97.5 ??F (36.4 ??C), Max:98.4 ??F (36.9 ??C)        11/08 1900 - 11/10 0659  In: 5688.3 [P.O.:480; I.V.:5208.3]  Out: -       PHYSICAL EXAM:  General: WD, WN. Alert, cooperative, no acute distress????  EENT:  EOMI. Anicteric sclerae. MMM  Resp:              Few  Wheezing+  CV:  Regular  rhythm,??  No edema  GI:  Soft,??+Bowel sounds, no HSM  Neurologic:?? CN 2-12 gi, Alert and oriented X 3.          Data Review:       Recent Days:  Recent Labs      01/07/12   0314  01/06/12   0306  01/04/12   2158   WBC  4.3   3.5*  9.6   HGB  9.4*  9.7*  11.5   HCT  27.3*  28.2*  33.2*   PLT  192  183  230     Recent Labs      01/07/12   0314  01/06/12   0306  01/04/12   2158   NA  141  140  137   K  3.6  3.2*  3.2*   CL  111*  109*  103   CO2  20*  22  24   GLU  117*  115*  114*   BUN  5*  3*  9   CREA  0.40*  0.48  0.88   CA  7.9*  7.8*  8.9   MG   --   1.5*  1.2*   ALB  2.8*  3.0*   --    TBIL  0.3  0.4   --    SGOT  193*  147*   --      No results found for this basename: PH, PCO2, PO2, HCO3, FIO2,  in the last 72 hours    24 Hour Results:  Recent Results (from the past 24 hour(s))   METABOLIC PANEL, COMPREHENSIVE    Collection Time    01/07/12  3:14 AM       Result Value Range    Sodium 141  136 - 145 MMOL/L    Potassium 3.6  3.5 - 5.1 MMOL/L    Chloride 111 (*) 97 - 108 MMOL/L    CO2 20 (*) 21 - 32 MMOL/L    Anion gap 10  5 - 15 mmol/L    Glucose 117 (*) 65 - 100 MG/DL    BUN 5 (*) 6 - 20 MG/DL    Creatinine 1.61 (*) 0.45 - 1.15 MG/DL    BUN/Creatinine ratio 13  12 - 20      GFR est AA >60  >60 ml/min/1.81m2    GFR est non-AA >60  >60 ml/min/1.71m2    Calcium 7.9 (*) 8.5 - 10.1 MG/DL    Bilirubin, total 0.3  0.2 - 1.0 MG/DL    ALT 096 (*) 12 - 78 U/L    AST 193 (*) 15 - 37 U/L    Alk. phosphatase 118  50 - 136 U/L    Protein, total 6.2 (*) 6.4 - 8.2 g/dL    Albumin 2.8 (*) 3.5 - 5.0 g/dL    Globulin 3.4  2.0 - 4.0 g/dL    A-G Ratio 0.8 (*) 1.1 - 2.2     CBC W/O DIFF    Collection Time    01/07/12  3:14 AM       Result Value Range    WBC 4.3  3.6 - 11.0 K/uL    RBC 3.15 (*) 3.80 - 5.20 M/uL    HGB 9.4 (*) 11.5 - 16.0 g/dL    HCT 04.5 (*) 40.9 - 47.0 %    MCV 86.7  80.0 - 99.0 FL    MCH 29.8  26.0 - 34.0 PG    MCHC 34.4  30.0 - 36.5 g/dL    RDW 81.1  91.4 - 78.2 %    PLATELET 192  150 - 400 K/uL       Problem List:  Problem List as of 01/07/2012 Date Reviewed: Jun 10, 2011        Codes Class Noted - Resolved    *Shortness of breath 786.05  01/05/2012 - Present        Hypokalemia 276.8  01/05/2012 - Present        Hypomagnesemia 275.2   01/05/2012 - Present        Dizziness 780.4  06/24/2009 - Present        HTN (hypertension) 401.9  06/24/2009 - Present        Vertigo 780.4  06/24/2009 - Present        Pharyngitis 462  06/24/2009 - Present              Medications reviewed- Reviewed   Current Facility-Administered Medications  Medication Dose Route Frequency   ??? potassium chloride SR (KLOR-CON 10) tablet 40 mEq  40 mEq Oral DAILY   ??? methylPREDNISolone (MEDROL) tablet 32 mg  32 mg Oral DAILY WITH BREAKFAST   ??? [COMPLETED] sodium chloride (NS) 0.9 % flush       ??? [COMPLETED] diphenhydrAMINE (BENADRYL) capsule 50 mg  50 mg Oral ONCE   ??? benazepril (LOTENSIN) tablet 10 mg  10 mg Oral DAILY   ??? ibuprofen (MOTRIN) tablet 800 mg  800 mg Oral Q6H PRN   ??? famotidine (PEPCID) tablet 20 mg  20 mg Oral BID PRN   ??? sodium chloride (NS) flush 5-10 mL  5-10 mL IntraVENous Q8H   ??? sodium chloride (NS) flush 5-10 mL  5-10 mL IntraVENous PRN   ??? ondansetron (ZOFRAN) injection 4 mg  4 mg IntraVENous Q6H PRN   ??? enoxaparin (LOVENOX) injection 40 mg  40 mg SubCUTAneous Q24H   ??? amLODIPine (NORVASC) tablet 5 mg  5 mg Oral DAILY   ??? 0.9% sodium chloride infusion  125 mL/hr IntraVENous CONTINUOUS   ??? butalbital-acetaminophen (PHRENILIN) 50-325 mg tablet 1 Tab  1 Tab Oral Q6H PRN   ??? albuterol (PROVENTIL VENTOLIN) nebulizer solution 1.25 mg  1.25 mg Nebulization Q6H RT   ??? guaiFENesin SR (MUCINEX) tablet 600 mg  600 mg Oral Q12H       Code Status:  Full Code x   DNR/DNI    ______________________________________________________________________  Total NON critical care TIME:    Minutes    Total CRITICAL CARE TIME Spent:   Minutes      Comments   >50% of visit spent in counseling and coordination of care     ______________________________________________________________________  Procedures: see electronic medical records for all procedures/Xrays and details which were not copied into this note but were reviewed prior to creation of Plan.          Total time spent with patient:  30 minutes.    Lucky Rathke, MD

## 2012-01-07 NOTE — Discharge Summary (Signed)
Discharge Summary       PATIENT ID: Vanessa Kramer  MRN: [id number]   DATE OF BIRTH: Jan 25, 1971    DATE OF ADMISSION: 01/04/2012  9:42 PM    DATE OF DISCHARGE: 01/07/12   PRIMARY CARE PROVIDER: Chase Picket, MD       DISCHARGING PHYSICIAN: Lucky Rathke, MD    To contact this individual call [phone] and ask the operator to page.   If unavailable ask to be transferred the Adult Hospitalist Department.    CONSULTATIONS: IP CONSULT TO HOSPITALIST  IP CONSULT TO GASTROENTEROLOGY    PROCEDURES/SURGERIES: * No surgery found *    ADMITTING DIAGNOSES & HOSPITAL COURSE:   Reactive airway dz        DISCHARGE ASSESSMENT / PLAN:       1. Shortness of breath (01/05/2012): Possibly reactive airway dz - improved with nebs/ mucinex CXR negative. CTA chest negative for PE. No fever, or leukocytosis. Will d/c on medrol as allergic to prednisone ,( d/w pulm) and inhalar  2. UTI? - started on levaquin but ( got 1 dose) but d/c  Today culture negative and  for abnormal LFT's will also get USG of RUQ  3. GI consult will need out pt follow up   3. Replete lytes   4. Tachycardia: resolved  5. HTN: cont amlodipine and benzapri       PENDING TEST RESULTS:   At the time of discharge the following test results are still pending:     FOLLOW UP APPOINTMENTS:  Pl follow up with PCP and GI within 2 weeks  Follow-up Information    None           ADDITIONAL CARE RECOMMENDATIONS: Repeat LFT's in 2 weeks    DIET: Cardiac Diet    ACTIVITY: activity as tolerated    WOUND CARE:     EQUIPMENT needed:       DISCHARGE MEDICATIONS:  Current Discharge Medication List      START taking these medications    Details   albuterol (PROVENTIL HFA, VENTOLIN HFA) 90 mcg/actuation inhaler Take 2 Puffs by inhalation every six (6) hours as needed for Wheezing.  Qty: 1 Inhaler, Refills: 1      methylPREDNISolone (MEDROL, PAK,) 4 mg tablet Per dose pack instructions  Qty: 1 Package, Refills: 0      guaiFENesin SR (MUCINEX) 600 mg SR tablet Take 1 Tab by mouth every  twelve (12) hours.  Qty: 15 Tab, Refills: 0         CONTINUE these medications which have NOT CHANGED    Details   !! promethazine (PHENERGAN) 25 mg tablet Take 1 Tab by mouth every six (6) hours as needed for Nausea.  Qty: 30 Tab, Refills: 0    Associated Diagnoses: Dizziness      acetaminophen-butalbital (BUPAP) 50-650 mg Tab Take 1 Each by mouth every six (6) hours as needed.  Qty: 30 Tab, Refills: 1    Associated Diagnoses: Tension headache      amLODIPine-benazepril (LOTREL) 5-10 mg per capsule Take 1 Cap by mouth daily.  Qty: 90 Cap, Refills: 1      !! promethazine (PHENERGAN) 25 mg tablet Take 1 Tab by mouth every six (6) hours as needed for Nausea.  Qty: 12 Tab, Refills: 0      ibuprofen (MOTRIN) 800 mg tablet Take 1 Tab by mouth every six (6) hours as needed for Pain.  Qty: 30 Tab, Refills: 3      RANITIDINE  HCL (ZANTAC PO) Take  by mouth two (2) times daily as needed.      dicyclomine (BENTYL) 10 mg capsule Take 10 mg by mouth 4 times daily (before meals and nightly).      meclizine (ANTIVERT) 25 mg tablet Take 1 Tab by mouth three (3) times daily as needed for Dizziness.  Qty: 20 Tab, Refills: 0       !! - Potential duplicate medications found. Please discuss with provider.            NOTIFY YOUR PHYSICIAN FOR ANY OF THE FOLLOWING:   Fever over 101 degrees for 24 hours.   Chest pain, shortness of breath, fever, chills, nausea, vomiting, diarrhea, change in mentation, falling, weakness, bleeding. Severe pain or pain not relieved by medications.  Or, any other signs or symptoms that you may have questions about.    DISPOSITION:    Home With:   OT  PT  HH  RN       Long term SNF/Inpatient Rehab    Independent/assisted living    Hospice    Other:       PATIENT CONDITION AT DISCHARGE:     Functional status    Poor     Deconditioned     Independent      Cognition     Lucid     Forgetful     Dementia      Catheters/lines (plus indication)    Foley     PICC     PEG     None      Code status     Full code     DNR       PHYSICAL EXAMINATION AT DISCHARGE:   Refer to Progress Note      CHRONIC MEDICAL DIAGNOSES:  Problem List as of 01/07/2012 Date Reviewed: 2011-05-29        Codes Class Noted - Resolved    Dizziness 780.4  06/24/2009 - Present        HTN (hypertension) 401.9  06/24/2009 - Present        Vertigo 780.4  06/24/2009 - Present        Pharyngitis 462  06/24/2009 - Present        *RESOLVED: Shortness of breath 786.05  01/05/2012 - 01/07/2012        RESOLVED: Hypokalemia 276.8  01/05/2012 - 01/07/2012        RESOLVED: Hypomagnesemia 275.2  01/05/2012 - 01/07/2012              Greater than 30 minutes were spent with the patient on counseling and coordination of care     Signed:   Lucky Rathke, MD  01/07/2012  10:53 AM

## 2012-01-08 LAB — HEPATITIS C ANTIBODY
HCV Ab: 0.19 Index
Hepatitis C Ab: NEGATIVE

## 2012-01-08 LAB — HEPATITIS PANEL, ACUTE
Hep B Core Ab, IgM: NEGATIVE
Hep B surface Ag screen: NEGATIVE
Hep C Virus Ab: <0.1 s/co ratio (ref 0.0–0.9)
Hepatitis A Ab, IgM: NEGATIVE

## 2012-01-08 LAB — HEPATITIS C AB
Hep C virus Ab Interp.: NEGATIVE
Hepatitis C virus Ab: 0.19 Index

## 2012-01-08 NOTE — Progress Notes (Signed)
Patient discharged on 01/07/12 from [facility]    Patient discharged home with 3 prescriptions (medrol dose pack, albuterol, mucinex) and instruction to follow up with PCP in 2 weeks, and GI specialist in 10 days - pt will have LFTs recheck in 1 week before seeing GI.    Spoke with patient today and she stated that she is feeling much better.  Patient denies dizziness, chest pain, shortness of breath, wheezing, nausea or vomiting.  She confirmed that she has filled prescriptions and taking medications as prescribed.  Patient encouraged to take all medications     Medication reconciliation completed.  Patient verbalized understanding of medication management.    GOALS:     Knowledge and adherence of prescribed medication (ie. action, side effects, missed dose, etc.). Patient was able to name all medications, doses, frequencies and reason for taking. Pt reports taking all medications as directed     Develop action plan for Self-Management Chronic Disease. Patient has follow up appointment scheduled with Dr. [name] and Dr. Archie Patten (GI specialist) on 01/23/2012. Patient that she has orders to have labs drawn before her follow up appointment.    Red Flags:   Fever over 101 degrees for 24 hours. Chest pain, shortness of breath, fever, chills, nausea, vomiting, diarrhea, change in mentation, falling, weakness, bleeding. Severe pain or pain not relieved by medications. Or, any other signs or symptoms that you may have questions about.     Pt is able to verbalize red flags and reasons to use PCP vs EMS services. Pt has a good support system. Patient has my contact number for further questions or concerns.

## 2012-01-09 NOTE — Telephone Encounter (Signed)
Patient is calling and states that she was just released from the hospital and is not really any better, she states that her chest is "still hurting from all of the coughing"  She wants to see if she should come in sooner or can maybe something be called in for her.

## 2012-01-09 NOTE — Telephone Encounter (Signed)
Returned call, pt stated that she continues to "have pressure in her chest" which she has been experiencing prior to going to the hospital. Pt denies audible wheezing or temp, but does have a productive cough. Pt stated that she has been taking all of her medications that were prescribed including her inhaler. Will forward message to Dr. [name] to advise.

## 2012-01-11 NOTE — Telephone Encounter (Signed)
Patient was call on the 11th and said she was doing well - not on the 12th she is not.  Which is it??  Can we follow up with her please

## 2012-01-12 NOTE — Telephone Encounter (Signed)
Called and left voicemail for pt to return call with how she is currently feeling and schedule follow up visit as needed

## 2012-01-23 NOTE — Patient Instructions (Addendum)
PRESCRIPTION REFILL POLICY    [facility] Statement to Patients  January 1,2013       In an effort to ensure the large volume of patient prescription refills is processed in the most efficient and expeditious manner, we are asking our patients to assist us by calling your Pharmacy for all prescription refills, this will include also your  Mail Order Pharmacy. The pharmacy will contact our office electronically to continue the refill process.    Please do not wait until the last minute to call your pharmacy. We need at least 48 hours (2days) to fill prescriptions. We also encourage you to call your pharmacy before going to pick up your prescription to make sure it is ready.     With regard to controlled substance prescription refill requests (narcotic refills) that need to be picked up at our office, we ask your cooperation by providing us with at least 72 hours (3days) notice that you will need a refill.    We will not refill narcotic prescription refill requests after 4:00pm on any weekday, Monday through Thursday, or after 2:00pm on Fridays, or on the weekends.      We encourage everyone to explore another way of getting your prescription refill request processed using MyChart, our patient web portal through our electronic medical record system. MyChart is an efficient and effective way to communicate your medication request directly to the office and  downloadable as an app on your smart phone . MyChart also features a review functionality that allows you to view your medication list as well as leave messages for your physician. Are you ready to get connected? If so please review the attatched instructions or speak to any of our staff to get you set up right away!    Thank you so much for your cooperation. Should you have any questions please contact our Practice Administrator.    The Physicians and Staff, [facility]      Prediabetes: After Your Visit  Your Care Instructions  Prediabetes  is a warning sign that you are at risk for getting type 2 diabetes. It means that your blood sugar is higher than it should be. Most people who get type 2 diabetes have prediabetes first. The good news is that lifestyle changes may help you get your blood sugar back to normal and avoid or delay diabetes. Also, pregnant women who get gestational diabetes may have prediabetes first.  Type 2 diabetes is a lifelong disease in which the body does not respond properly to a hormone called insulin or does not make enough of the hormone. Insulin helps sugar from your food enter your body cells to be used as energy.  Without insulin, the sugar cannot get into the cells to do its work. It stays in the blood instead. This can cause high blood sugar levels. A person has diabetes when the blood sugar stays too high too much of the time.  Follow-up care is a key part of your treatment and safety. Be sure to make and go to all appointments, and call your doctor if you are having problems. It???s also a good idea to know your test results and keep a list of the medicines you take.  How can you care for yourself at home?  ?? Watch your weight. A healthy weight helps your body use insulin properly.  ?? Eat a balanced diet. This may help you prevent or delay diabetes. Try to eat an even amount of carbohydrate throughout   the day. This can help you avoid sudden peaks in blood sugar.  ?? Ask your doctor if you should see a dietitian. A registered dietitian can help you develop a meal plan that fits your lifestyle.  ?? Get at least 30 minutes of exercise on most days of the week. Exercise helps control your blood sugar. It also helps you maintain a healthy weight. Walking is a good choice. You also may want to do other activities, such as running, swimming, cycling, or playing tennis or team sports.  ?? Do not smoke. Smoking can make prediabetes worse. If you need help quitting, talk to your doctor about stop-smoking programs and medicines. These  can increase your chances of quitting for good.  ?? If your doctor prescribed medicines, take them exactly as prescribed. Call your doctor if you think you are having a problem with your medicine. You will get more details on the specific medicines your doctor prescribes.  When should you call for help?  Watch closely for changes in your health, and be sure to contact your doctor if:  ?? You have any symptoms of diabetes. These may include:  ?? Being thirsty more often.  ?? Urinating more.  ?? Being hungrier.  ?? Losing weight.  ?? Being very tired.  ?? Having blurry vision.  ?? You have a wound that will not heal.  ?? You have an infection that will not go away.  ?? You have problems with your blood pressure.  ?? You want more information about diabetes and how you can keep from getting it.    Where can you learn more?    Go to http://www.healthwise.net/[facility]   Enter I222 in the search box to learn more about "Prediabetes: After Your Visit."    ?? 2006-2013 Healthwise[employer]. Care instructions adapted under license by [place] (which disclaims liability or warranty for this information). This care instruction is for use with your licensed healthcare professional. If you have questions about a medical condition or this instruction, always ask your healthcare professional. Healthwise, Incorporated disclaims any warranty or liability for your use of this information.  Content Version: 9.8.193578; Last Revised: April 18, 2011

## 2012-01-24 NOTE — Progress Notes (Signed)
Patient attended follow up visit with Dr. [name] on 01/23/12 (see notes). No new or changes to medications - no referral at this time    This NN will attempt to reach patient again in 1-2 weeks

## 2012-01-27 NOTE — Progress Notes (Signed)
Subjective:     Chief Complaint   Patient presents with   ??? Follow-up     hospital        She  is a 41 y.o. year old female who presents for evaluation of follow up after inpatient stay for new onset RAD.  Has SOB and some mild wheezing, but her dyspnea was significant and she presented to ER.  Took several days to get her sx under control with inpatient intensive treatment.  Never has these sx before.  Since discharge she had one day of SOB but that resolved and she has since improved streadily.  Only using albuter MDI less than daily now - about 1-2 times weekly for feelings of SOB.  Has completed her previous medications given on discharge.      Pertinent items are noted in HPI.  Objective:     Filed Vitals:    01/23/12 1517   BP: 119/85   Pulse: 98   Temp: 97.6 ??F (36.4 ??C)   TempSrc: Oral   Height: 5\' 4"  (1.626 m)   Weight: 123 lb 9.6 oz (56.065 kg)   SpO2: 98%       Physical Examination: General appearance - alert, well appearing, and in no distress  Mental status - alert, oriented to person, place, and time  Eyes - pupils equal and reactive, extraocular eye movements intact  Ears - bilateral TM's and external ear canals normal  Nose - normal and patent, no erythema, discharge or polyps  Mouth - mucous membranes moist, pharynx normal without lesions  Neck - supple, no significant adenopathy  Lymphatics - no palpable lymphadenopathy, no hepatosplenomegaly  Chest - clear to auscultation, no wheezes, rales or rhonchi, symmetric air entry  Heart - normal rate, regular rhythm, normal S1, S2, no murmurs, rubs, clicks or gallops    Allergies   Allergen Reactions   ??? Compazine (Prochlorperazine Edisylate) Other (comments)     Lock muscle   ??? Dilaudid (Hydromorphone (Pf)) Itching   ??? Percocet (Oxycodone-Acetaminophen) Itching   ??? Prednisone Swelling     Throat swelling      History     Social History   ??? Marital Status: SINGLE     Spouse Name: N/A     Number of Children: N/A   ??? Years of Education: N/A     Social  History Main Topics   ??? Smoking status: Never Smoker    ??? Smokeless tobacco: Never Used   ??? Alcohol Use: 1.5 oz/week     3 Glasses of wine per week      Comment: occ   ??? Drug Use: No   ??? Sexually Active: Yes -- Female partner(s)     Other Topics Concern   ??? Not on file     Social History Narrative   ??? No narrative on file      Family History   Problem Relation Age of Onset   ??? Hypertension Mother    ??? Hypertension Father       Past Surgical History   Procedure Laterality Date   ??? Hx hernia repair  1998   ??? Hx tubal ligation  2004   ??? Hx gyn       D&C      Past Medical History   Diagnosis Date   ??? GERD (gastroesophageal reflux disease)    ??? Hypertension    ??? Pap smear July 29, 2007      Current Outpatient Prescriptions   Medication Sig  Dispense Refill   ??? guaiFENesin SR (MUCINEX) 600 mg SR tablet Take 1 Tab by mouth every twelve (12) hours.  15 Tab  0   ??? amLODIPine-benazepril (LOTREL) 5-10 mg per capsule Take 1 Cap by mouth daily.  90 Cap  1   ??? albuterol (PROVENTIL HFA, VENTOLIN HFA) 90 mcg/actuation inhaler Take 2 Puffs by inhalation every six (6) hours as needed for Wheezing.  1 Inhaler  1   ??? methylPREDNISolone (MEDROL, PAK,) 4 mg tablet Per dose pack instructions  1 Package  0   ??? acetaminophen-butalbital (BUPAP) 50-650 mg Tab Take 1 Each by mouth every six (6) hours as needed.  30 Tab  1   ??? meclizine (ANTIVERT) 25 mg tablet Take 1 Tab by mouth three (3) times daily as needed for Dizziness.  20 Tab  0   ??? ibuprofen (MOTRIN) 800 mg tablet Take 1 Tab by mouth every six (6) hours as needed for Pain.  30 Tab  3   ??? dicyclomine (BENTYL) 10 mg capsule Take 10 mg by mouth 4 times daily (before meals and nightly).         No current facility-administered medications for this visit.        Assessment/ Plan:   Claudine was seen today for follow-up.    Diagnoses and associated orders for this visit:    RAD (reactive airway disease)    HTN (hypertension)         Not clear what triggered her RAD - now without sx and using her  rescue inhaler sparingly.  Discussed that she will need to monitor the use and immediately report any increase in frequency of need for inhaler for SOB or wheezing since her presentation was very unremakable and yet she needed inpatient treatment in order to keep her sats up for discharge.  New diagnosis and so much time was spent dicussing RAD, the step up treatment - her current status as mild, intermittent.  Patient expressed understanding.  Follow up as below.    Time Based coding.  Greater than 50% of 30 minute office visit spent on counseling and coordination of care.  New diagnosis discussion and counseling.  I have discussed the diagnosis with the patient and the intended plan as seen in the above orders.  The patient has received an after-visit summary and questions were answered concerning future plans.  I have discussed medication side effects and warnings with the patient as well.    Follow-up Disposition:  Return in about 3 months (around 04/24/2012).

## 2012-02-05 NOTE — Progress Notes (Signed)
Patient completed 30 day post hospital visit for shortness of breath.  Patient has met all goals and attended follow up visit with PCP as schedule.    Spoke with patient and she stated that she is feeling much better, but has resumed using her inhaler at least 3 times per day for shortness of breath, which she stated is related to the changes in the weather.  Offered patient a follow up visit with this office which she declined.  Patient is advised to call this office if she need to be seen.  Patient verbalized understanding    Hospital episode will be resolved at this time and patient will contact this office as needed.

## 2012-02-12 NOTE — Telephone Encounter (Signed)
Called [facility] and they need to verify the strength of the Fioricet that that received a paper script for. 300 mg or 325 mg. Asked Dr. [name] and notified [facility] that it is suppose to be 300 mg. [facility] voiced understanding.

## 2012-02-12 NOTE — Telephone Encounter (Signed)
#  [phone] call [facility] to verify strength

## 2012-05-12 LAB — POC GROUP A STREP: Group A strep (POC): NEGATIVE

## 2012-05-12 MED ADMIN — famotidine (PEPCID) tablet 40 mg: ORAL | @ 15:00:00 | NDC 68084017211

## 2012-05-12 MED ADMIN — diphenhydrAMINE (BENADRYL) capsule 50 mg: ORAL | @ 15:00:00 | NDC 00904205661

## 2012-05-12 NOTE — ED Provider Notes (Signed)
HPI Comments: 42 yo female presents to ER for evaluation of sensation of throat closing. Pt awoke this morning at 6 am and took a drink of water. Pt felt like it was difficult to swallow. Pt took 3 teaspoons of benadryl. Pt reports sensation has improved after the benadryl. No sensation of tongue or lips swelling. No difficulty swallowing. No nausea, vomiting, no chest pain, no shortness of breath, no difficulty breathing, no headache, no abdominal pain. No new foods or medicines.     Social hx  Nonsmoker  Occasional alcohol    Patient is a 42 y.o. female presenting with allergic reaction. The history is provided by the patient.   Allergic Reaction   Pertinent negatives include no nausea, no vomiting and no shortness of breath.        Past Medical History   Diagnosis Date   ??? GERD (gastroesophageal reflux disease)    ??? Hypertension    ??? Pap smear July 29, 2007        Past Surgical History   Procedure Laterality Date   ??? Hx hernia repair  1998   ??? Hx tubal ligation  2004   ??? Hx gyn       D&C         Family History   Problem Relation Age of Onset   ??? Hypertension Mother    ??? Hypertension Father         History     Social History   ??? Marital Status: SINGLE     Spouse Name: N/A     Number of Children: N/A   ??? Years of Education: N/A     Occupational History   ??? Not on file.     Social History Main Topics   ??? Smoking status: Never Smoker    ??? Smokeless tobacco: Never Used   ??? Alcohol Use: 1.5 oz/week     3 Glasses of wine per week      Comment: occ   ??? Drug Use: No   ??? Sexually Active: Yes -- Female partner(s)     Other Topics Concern   ??? Not on file     Social History Narrative   ??? No narrative on file                  ALLERGIES: Compazine; Dilaudid; Percocet; and Prednisone      Review of Systems   Constitutional: Negative for fever and chills.   HENT: Negative for congestion, sore throat, facial swelling, rhinorrhea, drooling, mouth sores, trouble swallowing, neck pain, neck stiffness, dental problem and voice change.     Respiratory: Negative for cough and shortness of breath.    Gastrointestinal: Negative for nausea, vomiting and abdominal pain.   Musculoskeletal: Negative for myalgias and arthralgias.   Skin: Negative for rash and wound.   Neurological: Negative for dizziness and headaches.   All other systems reviewed and are negative.        Filed Vitals:    05/12/12 1011   BP: 131/74   Pulse: 87   Temp: 98.6 ??F (37 ??C)   Resp: 16   Height: 5\' 4"  (1.626 m)   Weight: 54.432 kg (120 lb)   SpO2: 100%            Physical Exam   Nursing note and vitals reviewed.  Constitutional: She is oriented to person, place, and time. She appears well-developed and well-nourished.   HENT:   Head: Normocephalic and atraumatic.   Right  Ear: Tympanic membrane and external ear normal.   Left Ear: Tympanic membrane and external ear normal.   Nose: Nose normal.   Mouth/Throat: Oropharynx is clear and moist. No oropharyngeal exudate.   UVULA MIDLINE, NO TRISMUS, NO DROOLING, NO SUBMANDIBULAR SWELLING, NO TONSILLAR HYPERTROPHY OR EXUDATES, NO MASTOID TENDERNESS, NO ERYTHEMA. NO SWELLING TO POSTERIOR PHARYNX, NO ANGIOEDEMA, NO SWELLING TO TONGUE, NO SUBLINGUAL SWELLING.  PT TOLERATING SECRETIONS. PT ABLE TO SWALLOW WITHOUT PROBLEM.   Eyes: Conjunctivae and EOM are normal. Pupils are equal, round, and reactive to light.   Neck: Normal range of motion. Neck supple.   NO STRIDOR   Cardiovascular: Normal rate, regular rhythm and normal heart sounds.    Pulmonary/Chest: Effort normal and breath sounds normal. No respiratory distress. She has no wheezes.   Musculoskeletal: Normal range of motion.   Lymphadenopathy:     She has no cervical adenopathy.   Neurological: She is alert and oriented to person, place, and time. She displays normal reflexes. No cranial nerve deficit. She exhibits normal muscle tone. Coordination normal.   Skin: Skin is warm and dry. No rash noted.   Psychiatric: She has a normal mood and affect. Her behavior is normal. Judgment and  thought content normal.        MDM     Differential Diagnosis; Clinical Impression; Plan:     42 yo female with sensation of throat swelling. Onset was this morning, 5 hours ago. Improvement with benadryl.  Pt is nontoxic, no facial swelling, no oropharyngeal swelling.  Pt tolerating secretions.  Will give benadryl and pepcid. No prednisone as pt reports allergy.    12:08 PM  Pt reports not feeling better. No difficulty swallowing, no nausea, vomiting, no sensation of swelling.  Will discharge with continue benadryl and pepcid.    Patient's results have been reviewed with them.  Patient and/or family have verbally conveyed their understanding and agreement of the patient's signs, symptoms, diagnosis, treatment and prognosis and additionally agree to follow up as recommended or return to the Emergency Room should their condition change prior to follow-up.  Discharge instructions have also been provided to the patient with some educational information regarding their diagnosis as well a list of reasons why they would want to return to the ER prior to their follow-up appointment should their condition change.              Amount and/or Complexity of Data Reviewed:    Discuss the patient with another provider:  Yes (ER attending)  Progress:   Patient progress:  Stable      Procedures    Pt case including HPI, PE, and all available lab and radiology results has been discussed with attending physician. Opportunity to evaluate patient has been provided to ER attending.  Discharge and prescription plan has been agreed upon.

## 2012-05-12 NOTE — ED Notes (Signed)
Pt presents with throat swelling.  Pt states she woke up this morning and her throat felt tight.  Pt took 3 teaspoons or liquid benadryl with positive results.  Pt also reports she ate shrimp last night but denies being allergic to seafood.  Pt sat's in triage 100% on RA.  Pt denies SOB.

## 2012-05-12 NOTE — ED Provider Notes (Signed)
I was personally available for consultation in the emergency department.  I have reviewed the chart and agree with the documentation recorded by the MLP, including the assessment, treatment plan, and disposition.  Andrei Mccook M Deniyah Dillavou, MD

## 2012-05-14 LAB — CULTURE, THROAT

## 2012-06-14 NOTE — Telephone Encounter (Signed)
Refill

## 2012-06-14 NOTE — Telephone Encounter (Signed)
From: Vanessa Kramer  To: Chase Picket, MD  Sent: 06/14/2012 9:01 AM EDT  Subject: Medication Renewal Request    Original authorizing provider: Chase Picket, MD    Vanessa Kramer would like a refill of the following medications:  amLODIPine-benazepril (LOTREL) 5-10 mg per capsule Chase Picket, MD]    Preferred pharmacy: [id number] - [place], [place] - [address]    Comment:

## 2012-07-08 ENCOUNTER — Encounter

## 2012-07-08 NOTE — Telephone Encounter (Signed)
From: Cephus Shelling  To: Chase Picket, MD  Sent: 07/08/2012 8:43 AM EDT  Subject: Medication Renewal Request    Original authorizing provider: Chase Picket, MD    Cephus Shelling would like a refill of the following medications:  acetaminophen-butalbital (BUPAP) 50-650 mg Tab [[name], MD]    Preferred pharmacy: [id number] - [place], [place] - [address]    Comment:

## 2012-07-21 ENCOUNTER — Encounter

## 2012-07-23 NOTE — Telephone Encounter (Signed)
From: Vanessa Kramer  To: Chase Picket, MD  Sent: 07/21/2012 9:02 AM EDT  Subject: Medication Renewal Request    Original authorizing provider: Chase Picket, MD    Vanessa Kramer would like a refill of the following medications:  acetaminophen-butalbital (BUPAP) 50-650 mg tab Chase Picket, MD]    Preferred pharmacy: [id number] - [place], [place] - [address]    Comment:

## 2014-05-08 ENCOUNTER — Ambulatory Visit: Admit: 2014-05-08 | Discharge: 2014-05-08 | Attending: Internal Medicine | Primary: Internal Medicine

## 2014-05-08 ENCOUNTER — Telehealth

## 2014-05-08 DIAGNOSIS — Z7689 Persons encountering health services in other specified circumstances: Secondary | ICD-10-CM

## 2014-05-08 MED ORDER — LISINOPRIL 20 MG TAB
20 mg | ORAL_TABLET | Freq: Every day | ORAL | Status: DC
Start: 2014-05-08 — End: 2014-05-28

## 2014-05-08 MED ORDER — BUTALBITAL 50 MG-ACETAMINOPHEN 650 MG TABLET
50-650 mg | ORAL_TABLET | Freq: Four times a day (QID) | ORAL | Status: DC | PRN
Start: 2014-05-08 — End: 2014-05-11

## 2014-05-08 NOTE — Progress Notes (Signed)
Have you been to the ER or urgent care clinic since your last visit?    No     Have you been hospitalized since your last visit?   No     Have you been seen or consulted any other health care provider outside of [facility] since your last visit (including pap smears, colonoscopy screening)?    No

## 2014-05-08 NOTE — Progress Notes (Signed)
Vanessa Kramer is a 44 y.o. female who presents today for Seen By Non-ED Provider and Medication Refill  .      She has a history of   Patient Active Problem List   Diagnosis Code   ??? Dizziness R42   ??? HTN (hypertension) I10   ??? Vertigo R42   ??? Pharyngitis J02.9   ??? Asthma J45.909   .    Today patient is here to establish care.   Previous PCP Dr. [name], last seen in 2013. she is switching because: still no insurance. Pt trying to get the care card.   Records are available for me to review.     she does not have other concerns.    HTN: diagnosed 13 yrs ago. Has been treated in the past. Recently BP has been quite high. [facility] 160's/ 102.     GERD: Pt has had heartburn for 20+ years. History of hiatal hernia fixed in 1999.     Asthma: ? Diagnosis due to SOB.     H/A: migraines. Pt takes BuPap because fiorcet does not work. Pt getting migraines 1x/mos.       Health maintenance hx includes:  Exercise: not veryactive.  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     Screening:    Colon cancer screening:  Last Colonoscopy: N/A   Breast cancer screening: last mammogram 2011 and   was normal   Cervical cancer screening: last PAP/Pelvic exam: 2012   and was normal.   Osteoporosis screening:  Last BMD: N/A    Immunizations:     There is no immunization history on file for this patient.   Immunization status: stated as current, but no records available. Last tetanus in the last 3 yrs.   Four boys at home. + sexual active with one partner. No history of STDs.     ROS  Review of Systems   Constitutional: Negative for fever, chills and weight loss.   HENT: Negative for ear pain, hearing loss and tinnitus.    Eyes: Negative for blurred vision, double vision, photophobia and pain.    Respiratory: Negative for cough, hemoptysis, sputum production and shortness of breath.    Cardiovascular: Positive for leg swelling (on occasion). Negative for chest pain, palpitations, orthopnea, claudication and PND.   Gastrointestinal: Positive for heartburn, nausea and constipation. Negative for vomiting, abdominal pain, diarrhea, blood in stool and melena.   Genitourinary: Negative for dysuria, urgency and frequency.   Musculoskeletal: Negative for myalgias, back pain and neck pain.   Skin: Negative for rash.   Neurological: Positive for headaches.   Psychiatric/Behavioral: Negative for depression and suicidal ideas. The patient is not nervous/anxious.        Visit Vitals   Item Reading   ??? BP 149/95 mmHg   ??? Pulse 84   ??? Temp(Src) 98.6 ??F (37 ??C) (Oral)   ??? Resp 16   ??? Ht  (1.626 m)   ??? Wt 138 lb (62.596 kg)   ??? BMI 23.68 kg/m2   ??? SpO2 98%       Physical Exam   Constitutional: She is oriented to person, place, and time and well-developed, well-nourished, and in no distress. No distress.   HENT:   Head: Normocephalic  and atraumatic.   Mouth/Throat: No oropharyngeal exudate.   Eyes: Conjunctivae are normal. Pupils are equal, round, and reactive to light. No scleral icterus.   Neck: Normal range of motion. No JVD present. No thyromegaly present.   Cardiovascular: Normal rate and regular rhythm.  Exam reveals no gallop and no friction rub.    No murmur heard.  Pulmonary/Chest: Effort normal and breath sounds normal. No respiratory distress. She has no wheezes.   Abdominal: Soft. Bowel sounds are normal. She exhibits no distension. There is no rebound and no guarding.   Musculoskeletal: Normal range of motion. She exhibits no edema.   Neurological: She is alert and oriented to person, place, and time. No cranial nerve deficit.   Skin: Skin is dry. No rash noted. She is not diaphoretic.   Psychiatric: Mood, memory, affect and judgment normal.       Current Outpatient Prescriptions   Medication Sig    ??? acetaminophen-butalbital (BUPAP) 50-650 mg tab Take 1 Each by mouth every six (6) hours as needed.   ??? lisinopril (PRINIVIL, ZESTRIL) 20 mg tablet Take 1 Tab by mouth daily.     No current facility-administered medications for this visit.        Past Medical History   Diagnosis Date   ??? GERD (gastroesophageal reflux disease)    ??? Hypertension    ??? Pap smear July 29, 2007   ??? Asthma    ??? Headache       Past Surgical History   Procedure Laterality Date   ??? Hx hernia repair  1998   ??? Hx tubal ligation  2004   ??? Hx gyn       D&C      History   Substance Use Topics   ??? Smoking status: Never Smoker    ??? Smokeless tobacco: Never Used   ??? Alcohol Use: 1.5 oz/week     3 Glasses of wine per week      Comment: occ      Family History   Problem Relation Age of Onset   ??? Hypertension Mother    ??? Hypertension Father    ??? Hypertension Brother    ??? Cancer Maternal Grandmother    ??? Cancer Paternal Aunt      pancreatic        Allergies   Allergen Reactions   ??? Compazine [Prochlorperazine Edisylate] Other (comments)     Lock muscle   ??? Dilaudid [Hydromorphone (Pf)] Itching   ??? Percocet [Oxycodone-Acetaminophen] Itching   ??? Prednisone Swelling     Throat swelling        Assessment/Plan  [name] was seen today for seen by non-ed provider and medication refill.    Diagnoses and all orders for this visit:    Encounter to establish care with new doctor - No new records. Pt has been out of medication and unfortunately not able to see an MD due to lack of insurance. Pt working on care card.     Essential hypertension - Most important thing that needs to be addressed. Pt with some H/A and blurry vision in the past week due to HTN. None now. Will restart a $4 ACEi for pt.   Orders:  -     lisinopril (PRINIVIL, ZESTRIL) 20 mg tablet; Take 1 Tab by mouth daily.    Tension headache- PRN bupap  Orders:  -     acetaminophen-butalbital (BUPAP) 50-650 mg tab; Take 1 Each by mouth every six (6) hours as needed.  Wellness examination - no labs today due to cost. No history of kidney disease. Pt given referral to follow up with Dr. Tracey Harries after has some coverage.   Orders:  -     REFERRAL TO OBSTETRICS AND GYNECOLOGY        Follow-up Disposition:  Return in about 3 weeks (around 05/29/2014) for BP check.    [name], MD  05/08/2014

## 2014-05-08 NOTE — Patient Instructions (Signed)
Well Visit, Ages 18 to 50: Care Instructions  Your Care Instructions  Physical exams can help you stay healthy. Your doctor has checked your overall health and may have suggested ways to take good care of yourself. He or she also may have recommended tests. At home, you can help prevent illness with healthy eating, regular exercise, and other steps.  Follow-up care is a key part of your treatment and safety. Be sure to make and go to all appointments, and call your doctor if you are having problems. It's also a good idea to know your test results and keep a list of the medicines you take.  How can you care for yourself at home?  ?? Reach and stay at a healthy weight. This will lower your risk for many problems, such as obesity, diabetes, heart disease, and high blood pressure.  ?? Get at least 30 minutes of physical activity on most days of the week. Walking is a good choice. You also may want to do other activities, such as running, swimming, cycling, or playing tennis or team sports. Discuss any changes in your exercise program with your doctor.  ?? Do not smoke or allow others to smoke around you. If you need help quitting, talk to your doctor about stop-smoking programs and medicines. These can increase your chances of quitting for good.  ?? Talk to your doctor about whether you have any risk factors for sexually transmitted infections (STIs). Having one sex partner (who does not have STIs and does not have sex with anyone else) is a good way to avoid these infections.  ?? Use birth control if you do not want to have children at this time. Talk with your doctor about the choices available and what might be best for you.  ?? Always wear sunscreen on exposed skin. Make sure the sunscreen blocks ultraviolet rays (both UVA and UVB) and has a sun protection factor (SPF) of at least 15. Use it every day, even when it is cloudy. Some doctors may recommend a higher SPF, such as 30.   ?? See a dentist one or two times a year for checkups and to have your teeth cleaned.  ?? Wear a seat belt in the car.  ?? Drink alcohol in moderation, if at all. That means no more than 2 drinks a day for men and 1 drink a day for women.  Follow your doctor's advice about when to have certain tests. These tests can spot problems early.  For everyone  ?? Cholesterol. Have the fat (cholesterol) in your blood tested after age 20. Your doctor will tell you how often to have this done based on your age, family history, or other things that can increase your risk for heart disease.  ?? Blood pressure. Experts suggest that healthy adults with normal blood pressure (119/79 mm Hg or below) have their blood pressure checked at least every 1 to 2 years. This can be done during a routine doctor visit. If you have slightly higher or high blood pressure, your doctor will suggest more frequent tests.  ?? Vision. Talk with your doctor about how often to have a glaucoma test.  ?? Diabetes. Ask your doctor whether you should have tests for diabetes.  ?? Colon cancer. Have a test for colon cancer at age 45. You may have one of several tests. If you are younger than 50, you may need a test earlier if you have any risk factors. Risk factors include whether you already had   a precancerous polyp removed from your colon or whether your parent, brother, sister, or child has had colon cancer.  For women  ?? Breast exam and mammogram. Talk to your doctor about when you should have a clinical breast exam and a mammogram. Medical experts differ on whether and how often women under 50 should have these tests. Your doctor can help you decide what is right for you.  ?? Pap test and pelvic exam. Begin Pap tests at age 21. A Pap test is the best way to find cervical cancer. The test often is part of a pelvic exam. Ask how often to have this test.  ?? Tests for sexually transmitted infections (STIs). Ask whether you should  have tests for STIs. You may be at risk if you have sex with more than one person, especially if your partners do not wear condoms.  For men  ?? Tests for sexually transmitted infections (STIs). Ask whether you should have tests for STIs. You may be at risk if you have sex with more than one person, especially if you do not wear a condom.  ?? Testicular cancer exam. Ask your doctor whether you should check your testicles regularly.  ?? Prostate exam. Talk to your doctor about whether you should have a blood test (called a PSA test) for prostate cancer. Experts differ on whether and when men should have this test. Some experts suggest it if you are older than 45 and are African-American or have a father or brother who got prostate cancer when he was younger than 65.  When should you call for help?  Watch closely for changes in your health, and be sure to contact your doctor if you have any problems or symptoms that concern you.   Where can you learn more?   Go to http://www.healthwise.net/[facility]  Enter P072 in the search box to learn more about "Well Visit, Ages 18 to 50: Care Instructions."   ?? 2006-2015 Healthwise[employer] Inc[employer]. Care instructions adapted under license by [employer] (which disclaims liability or warranty for this information). This care instruction is for use with your licensed healthcare professional. If you have questions about a medical condition or this instruction, always ask your healthcare professional. Healthwise, Incorporated disclaims any warranty or liability for your use of this information.  Content Version: 10.7.482551; Current as of: April 18, 2013

## 2014-05-08 NOTE — Telephone Encounter (Signed)
[facility] # [id number] request an alternative Rx for BuPap. This medication no longer is manufactured

## 2014-05-11 MED ORDER — BUTALBITAL-ACETAMINOPHEN-CAFFEINE 50 MG-325 MG-40 MG TAB
50-325-40 mg | ORAL_TABLET | Freq: Four times a day (QID) | ORAL | Status: DC | PRN
Start: 2014-05-11 — End: 2014-08-24

## 2014-05-11 NOTE — Telephone Encounter (Signed)
Called in script to [facility].     Patient states she would like to stay on the lisinopril a few more days to see how she feels, as she has tolerated in the past. She will let us know if her nausea is worse or new side effects occur. She is monitoring her bp's as well.

## 2014-05-11 NOTE — Telephone Encounter (Signed)
Please let me know how severe. She has tolerated ACEi in the past. We can switch her to HCTZ if she prefers.

## 2014-05-11 NOTE — Telephone Encounter (Signed)
Call pt and let her know. She said that fiorcet did not help in the past, but we could try this again if she wants.

## 2014-05-11 NOTE — Telephone Encounter (Signed)
Spoke to patient, she is willing to try fiorcet again to her [facility] pharmacy.  (pending)    She states concern with nausea that is lasting her all day, stating it started with her lisinopril 20 mg she started on Friday. No fever, abd. Pain, nausea, or vomiting present.

## 2014-05-26 ENCOUNTER — Inpatient Hospital Stay
Admit: 2014-05-26 | Discharge: 2014-05-26 | Disposition: A | Discharge status: Home / Self Care | Payer: Charity | Attending: Emergency Medicine

## 2014-05-26 DIAGNOSIS — J029 Acute pharyngitis, unspecified: Secondary | ICD-10-CM

## 2014-05-26 LAB — STREP AG SCREEN, GROUP A: Group A Strep Ag ID: NEGATIVE

## 2014-05-26 MED ORDER — IBUPROFEN 600 MG TAB
600 mg | ORAL | Status: AC
Start: 2014-05-26 — End: 2014-05-26
  Administered 2014-05-26: 11:00:00 via ORAL

## 2014-05-26 MED FILL — IBUPROFEN 600 MG TAB: 600 mg | ORAL | Qty: 1

## 2014-05-26 NOTE — ED Provider Notes (Signed)
Patient is a 44 y.o. female presenting with sore throat.   Sore Throat          43y F here with a sore/scratchy throat. Started yesterday. She took an OTC allergy medication last night and this seemed to greatly improve sx's. She did not take any medication this morning. No fever. No rash. No drooling. No change in voice. Still able to eat and drink normally. No rash. No vomiting. No difficulty breathing. No chest pain. No abdominal pain. No sinus pain/pressure.    Past Medical History:   Diagnosis Date   ??? GERD (gastroesophageal reflux disease)    ??? Hypertension    ??? Pap smear July 29, 2007   ??? Asthma    ??? Headache    ??? Chronic kidney disease        Past Surgical History:   Procedure Laterality Date   ??? Hx hernia repair  1998   ??? Hx tubal ligation  2004   ??? Hx gyn       D&C         Family History:   Problem Relation Age of Onset   ??? Hypertension Mother    ??? Hypertension Father    ??? Hypertension Brother    ??? Cancer Maternal Grandmother    ??? Cancer Paternal Aunt      pancreatic       History     Social History   ??? Marital Status: SINGLE     Spouse Name: N/A   ??? Number of Children: N/A   ??? Years of Education: N/A     Occupational History   ??? Not on file.     Social History Main Topics   ??? Smoking status: Never Smoker    ??? Smokeless tobacco: Never Used   ??? Alcohol Use: 1.5 oz/week     3 Glasses of wine per week      Comment: occ   ??? Drug Use: No   ??? Sexual Activity:     Partners: Male     Other Topics Concern   ??? Not on file     Social History Narrative           ALLERGIES: Compazine; Dilaudid; Percocet; and Prednisone      Review of Systems   HENT: Positive for sore throat.    Review of Systems   Constitutional: (-) weight loss.   HEENT: (-) stiff neck   Eyes: (-) discharge.   Respiratory: (-) for cough.    Cardiovascular: (-) syncope.   Gastrointestinal: (-) blood in stool.   Genitourinary: (-) hematuria.  Musculoskeletal: (-) myalgias.   Neurological: (-) seizure.   Skin: (-) petechiae   Lymph/Immunologic: (-) enlarged lymph nodes  All other systems reviewed and are negative.        There were no vitals filed for this visit.         Physical Exam Nursing note and vitals reviewed.  Constitutional: oriented to person, place, and time. appears well-developed and well-nourished. No distress.  Head: Normocephalic and atraumatic. Sclera anicteric  Nose: No rhinorrhea  Mouth/Throat: Oropharynx is clear and moist. Post pharynx is slightly erythematous.  Eyes: Conjunctivae are normal. Pupils are equal, round, and reactive to light. Right eye exhibits no discharge. Left eye exhibits no discharge.  Neck: Painless normal range of motion. Neck supple. No LAD.  Cardiovascular: Normal rate, regular rhythm, normal heart sounds and intact distal pulses.  Exam reveals no gallop and no friction rub.  No  murmur heard.  Pulmonary/Chest:  No respiratory distress. No wheezes. No rales. No rhonchi. No increased work of breathing. No accessory muscle use. Good air exchange throughout.  Abdominal: soft, non-tender, no rebound or guarding. No hepatosplenomegaly. Normal bowel sounds throughout.  Back: no tenderness to palpation, no deformities, no CVA tenderness  Extremities/Musculoskeletal: Normal range of motion. no tenderness. No edema. Distal extremities are neurovasc intact.  Lymphadenopathy:   No adenopathy.   Neurological:  Alert and oriented to person, place, and time. Coordination normal. CN 2-12 intact. Motor and sensory function intact.  Skin: Skin is warm and dry. No rash noted. No pallor.       MDM 58y F here with sore throat that improved with OTC allergy medication prior to arrival. She appears well and is not in any distress. Will dc with continued OTC allergy meds as well as motrin/tyelnol for symptomatic relief.    Procedures

## 2014-05-26 NOTE — ED Notes (Signed)
Discharged by Provider. Dr. Laural BenesJohnson aware of pt's BP of 176/130 and states she is okay to go home as long as patient states she will take her BP meds when she gets home. Pt states she will. Pt leaves ambulatory with steady gait.

## 2014-05-26 NOTE — ED Notes (Signed)
Sore throat.  Feels like swallowing "nails".  Throat feels swollen this am.

## 2014-05-28 ENCOUNTER — Ambulatory Visit: Admit: 2014-05-28 | Discharge: 2014-05-28 | Attending: Internal Medicine | Primary: Internal Medicine

## 2014-05-28 DIAGNOSIS — I10 Essential (primary) hypertension: Secondary | ICD-10-CM

## 2014-05-28 LAB — CULTURE, THROAT: Culture result:: NORMAL

## 2014-05-28 MED ORDER — LISINOPRIL-HYDROCHLOROTHIAZIDE 20 MG-12.5 MG TAB
ORAL_TABLET | Freq: Every day | ORAL | Status: DC
Start: 2014-05-28 — End: 2014-06-15

## 2014-05-28 NOTE — Progress Notes (Signed)
Vanessa Kramer is a 44 y.o. female who presents today for Hospital Follow Up; Headache; and Hoarse  .      She has a history of   Patient Active Problem List   Diagnosis Code   ??? Dizziness R42   ??? HTN (hypertension) I10   ??? Vertigo R42   ??? Pharyngitis J02.9   ??? Asthma J45.909   .    Today patient is here to follow up from ER visit for sore throat. Pt improved with OTC allergy medication. Pt notes swelling and pain in throat. Had ben on ACEi for some time. No cough. Pt overall feeling much better with the exception of having lost her voice. No F/C.    she does not have other concerns.    Hypertension - pt not being cared for in recent years due to lack of insurance. Pt has been taking the lisinopril daily.   Hypertension ROS: taking medications as instructed, no medication side effects noted, no TIA's, no chest pain on exertion, no dyspnea on exertion, no swelling of ankles     reports that she has never smoked. She has never used smokeless tobacco.    reports that she drinks about 1.5 oz of alcohol per week.   BP Readings from Last 2 Encounters:   05/28/14 154/108   05/26/14 176/130       ROS  Review of Systems   Constitutional: Negative for fever, chills, weight loss, malaise/fatigue and diaphoresis.   HENT: Positive for congestion and sore throat. Negative for ear pain, hearing loss, nosebleeds and tinnitus.    Eyes: Negative for blurred vision and double vision.   Respiratory: Negative for cough, hemoptysis, sputum production, shortness of breath and stridor.    Cardiovascular: Negative for chest pain, palpitations, orthopnea and claudication.   Gastrointestinal: Negative for heartburn, nausea, vomiting, abdominal pain, diarrhea, constipation and blood in stool.   Genitourinary: Negative for dysuria, urgency and frequency.   Skin: Negative for itching and rash.   Neurological: Positive for headaches (Still chronic, no help with fiorcet, some help with excedrine). Negative for dizziness and tingling.    Psychiatric/Behavioral: Negative for depression and suicidal ideas.       Visit Vitals   Item Reading   ??? BP 154/108 mmHg   ??? Pulse 92   ??? Temp(Src) 98.7 ??F (37.1 ??C) (Oral)   ??? Resp 16   ??? Ht  (1.676 m)   ??? Wt 138 lb 6.4 oz (62.778 kg)   ??? BMI 22.35 kg/m2   ??? SpO2 98%       Physical Exam   Constitutional: She is oriented to person, place, and time and well-developed, well-nourished, and in no distress. No distress.   Has lost her voice     HENT:   Head: Normocephalic and atraumatic.   Mouth/Throat: Oropharynx is clear and moist. No oropharyngeal exudate.   No swelling   Eyes: Conjunctivae are normal. Pupils are equal, round, and reactive to light. No scleral icterus.   Neck: Normal range of motion. No JVD present. No thyromegaly present.   Cardiovascular: Normal rate and regular rhythm.  Exam reveals no gallop and no friction rub.    No murmur heard.  Pulmonary/Chest: Effort normal and breath sounds normal. No respiratory distress. She has no wheezes.   Abdominal: Soft. Bowel sounds are normal. She exhibits no distension. There is no rebound and no guarding.   Musculoskeletal: Normal range of motion. She exhibits no edema.   Neurological: She  is alert and oriented to person, place, and time. No cranial nerve deficit.   Skin: Skin is dry. No rash noted. She is not diaphoretic.   Psychiatric: Mood, memory, affect and judgment normal.       Current Outpatient Prescriptions   Medication Sig   ??? lisinopril-hydrochlorothiazide (PRINZIDE, ZESTORETIC) 20-12.5 mg per tablet Take 1 Tab by mouth daily.   ??? LORATADINE/PSEUDOEPHEDRINE (ALLERGY & CONGESTION RELIEF PO) Take  by mouth.   ??? butalbital-acetaminophen-caffeine (FIORICET, ESGIC) 50-325-40 mg per tablet Take 1 Tab by mouth every six (6) hours as needed for Pain. Max Daily Amount: 4 Tabs.     No current facility-administered medications for this visit.        Past Medical History   Diagnosis Date   ??? GERD (gastroesophageal reflux disease)    ??? Hypertension     ??? Pap smear July 29, 2007   ??? Asthma    ??? Headache    ??? Chronic kidney disease       Past Surgical History   Procedure Laterality Date   ??? Hx hernia repair  1998   ??? Hx tubal ligation  2004   ??? Hx gyn       D&C      History   Substance Use Topics   ??? Smoking status: Never Smoker    ??? Smokeless tobacco: Never Used   ??? Alcohol Use: 1.5 oz/week     3 Glasses of wine per week      Comment: occ      Family History   Problem Relation Age of Onset   ??? Hypertension Mother    ??? Hypertension Father    ??? Hypertension Brother    ??? Cancer Maternal Grandmother    ??? Cancer Paternal Aunt      pancreatic        Allergies   Allergen Reactions   ??? Compazine [Prochlorperazine Edisylate] Other (comments)     Lock muscle   ??? Dilaudid [Hydromorphone (Pf)] Itching   ??? Percocet [Oxycodone-Acetaminophen] Itching   ??? Prednisone Swelling     Throat swelling        Assessment/Plan  Vanessa Kramer was seen today for hospital follow up, headache and hoarse.    Diagnoses and all orders for this visit:    Screening - Pt with some anemia in 2013 and abnormal labs. She reports this was during a hospitalization for URI and pyelonephritis. Repeat CBC and CMP.   Orders:  -     CBC WITH AUTOMATED DIFF  -     METABOLIC PANEL, COMPREHENSIVE  -     TSH, 3RD GENERATION  -     LIPID PANEL    Pharyngitis - s/s overall improving. Symptoms not consistent with angioedema. Likely viral.     Essential hypertension- Not at goal. She reports that she is taking her meds. Add HCTZ.   -     lisinopril-hydrochlorothiazide (PRINZIDE, ZESTORETIC) 20-12.5 mg per tablet; Take 1 Tab by mouth daily.        Follow-up Disposition:  Return in about 2 weeks (around 06/11/2014) for Complete Physical Exam.    Cordella RegisterJeremy Anayansi Rundquist, MD  05/28/2014

## 2014-05-28 NOTE — Progress Notes (Signed)
1. Have you been to the ER, urgent care clinic since your last visit?  Hospitalized since your last visit?Yes 05/26/14 for sore throat    2. Have you seen or consulted any other health care providers outside of the [facility] since your last visit?  Include any pap smears or colon screening.  No      Pt is in the office today for ER follow up from 05/26/14 for sore throat. Strep came back negative. Pt now has lost her voice, but states her sore throat is better. Pt states she has also developed a headache since going to the ER, daily.

## 2014-05-28 NOTE — Progress Notes (Signed)
[facility] Diagnosis: sore throat.  Left message on voicemail to return call and to make a f/u appt.  Will attempt to contact again.  Need to complete post-discharge assessment.

## 2014-05-28 NOTE — Progress Notes (Signed)
My chart letter sent to pt discussing alternatives other than going to ER, especially for a sore throat.

## 2014-05-29 LAB — METABOLIC PANEL, COMPREHENSIVE
A-G Ratio: 1.4 (ref 1.1–2.5)
ALT (SGPT): 22 IU/L (ref 0–32)
AST (SGOT): 27 IU/L (ref 0–40)
Albumin: 4.4 g/dL (ref 3.5–5.5)
Alk. phosphatase: 84 IU/L (ref 39–117)
BUN/Creatinine ratio: 8 — ABNORMAL LOW (ref 9–23)
BUN: 7 mg/dL (ref 6–24)
Bilirubin, total: 0.2 mg/dL (ref 0.0–1.2)
CO2: 22 mmol/L (ref 18–29)
Calcium: 9.2 mg/dL (ref 8.7–10.2)
Chloride: 99 mmol/L (ref 97–108)
Creatinine: 0.93 mg/dL (ref 0.57–1.00)
GFR est AA: 86 mL/min/{1.73_m2} (ref >59)
GFR est non-AA: 75 mL/min/{1.73_m2} (ref >59)
GLOBULIN, TOTAL: 3.2 g/dL (ref 1.5–4.5)
Glucose: 89 mg/dL (ref 65–99)
Potassium: 4.3 mmol/L (ref 3.5–5.2)
Protein, total: 7.6 g/dL (ref 6.0–8.5)
Sodium: 141 mmol/L (ref 134–144)

## 2014-05-29 LAB — CBC WITH AUTOMATED DIFF
ABS. BASOPHILS: 0 10*3/uL (ref 0.0–0.2)
ABS. EOSINOPHILS: 0.2 10*3/uL (ref 0.0–0.4)
ABS. IMM. GRANS.: 0 10*3/uL (ref 0.0–0.1)
ABS. MONOCYTES: 0.6 10*3/uL (ref 0.1–0.9)
ABS. NEUTROPHILS: 5.1 10*3/uL (ref 1.4–7.0)
Abs Lymphocytes: 1.4 10*3/uL (ref 0.7–3.1)
BASOPHILS: 0 %
EOSINOPHILS: 2 %
HCT: 35.4 % (ref 34.0–46.6)
HGB: 11.9 g/dL (ref 11.1–15.9)
IMMATURE GRANULOCYTES: 0 %
Lymphocytes: 20 %
MCH: 29.6 pg (ref 26.6–33.0)
MCHC: 33.6 g/dL (ref 31.5–35.7)
MCV: 88 fL (ref 79–97)
MONOCYTES: 8 %
NEUTROPHILS: 70 %
PLATELET: 270 10*3/uL (ref 150–379)
RBC: 4.02 x10E6/uL (ref 3.77–5.28)
RDW: 14.2 % (ref 12.3–15.4)
WBC: 7.4 10*3/uL (ref 3.4–10.8)

## 2014-05-29 LAB — LIPID PANEL
Cholesterol, total: 210 mg/dL — ABNORMAL HIGH (ref 100–199)
HDL Cholesterol: 75 mg/dL (ref >39)
LDL, calculated: 116 mg/dL — ABNORMAL HIGH (ref 0–99)
Triglyceride: 94 mg/dL (ref 0–149)
VLDL, calculated: 19 mg/dL (ref 5–40)

## 2014-05-29 LAB — TSH 3RD GENERATION: TSH: 1.53 u[IU]/mL (ref 0.450–4.500)

## 2014-05-29 LAB — CVD REPORT

## 2014-05-29 NOTE — Progress Notes (Signed)
Pt noted in for f/u with PCP Dr. Tonny Bollmanavussin on 05/28/14. Will f/u at a later time.    Assessment/Plan  Vanessa Kramer was seen today for hospital follow up, headache and hoarse.  Diagnoses and all orders for this visit:  Screening - Pt with some anemia in 2013 and abnormal labs. She reports this was during a hospitalization for URI and pyelonephritis. Repeat CBC and CMP.   Orders:  -???????? CBC WITH AUTOMATED DIFF  -???????? METABOLIC PANEL, COMPREHENSIVE  -???????? TSH, 3RD GENERATION  -???????? LIPID PANEL  Pharyngitis - s/s overall improving. Symptoms not consistent with angioedema. Likely viral.   Essential hypertension- Not at goal. She reports that she is taking her meds. Add HCTZ.   -???????? lisinopril-hydrochlorothiazide (PRINZIDE, ZESTORETIC) 20-12.5 mg per tablet; Take 1 Tab by mouth daily.  Follow-up Disposition:  Return in about 2 weeks (around 06/11/2014) for Complete Physical Exam.  Vanessa RegisterJeremy Ravussin, MD  05/28/2014

## 2014-05-29 NOTE — Progress Notes (Signed)
Quick Note:        Mychart message sent    ______

## 2014-06-11 ENCOUNTER — Ambulatory Visit: Admit: 2014-06-11 | Discharge: 2014-06-11 | Attending: Internal Medicine | Primary: Internal Medicine

## 2014-06-11 DIAGNOSIS — I10 Essential (primary) hypertension: Secondary | ICD-10-CM

## 2014-06-11 NOTE — Progress Notes (Signed)
Vanessa Kramer is a 44 y.o. female who presents today for Hypertension  .      She has a history of   Patient Active Problem List   Diagnosis Code   ??? Dizziness R42   ??? HTN (hypertension) I10   ??? Vertigo R42   ??? Pharyngitis J02.9   ??? Asthma J45.909   .    Today patient is here for f/u on HTN.   she does not have other concerns. Blood work done at last visit all looks good. Pharyngitis all looks good.     Hypertension - not at goal on just the ACEi. Added HCTZ. BP much better controlled today. Pt does note occasional orthostatic symptoms. This has happened in the past with 25mg  HCTZ. Pt reports tolerable. H/A improved.   Hypertension ROS: taking medications as instructed, no medication side effects noted, no TIA's, no chest pain on exertion, no dyspnea on exertion, no swelling of ankles     reports that she has never smoked. She has never used smokeless tobacco.    reports that she drinks about 1.5 oz of alcohol per week.   BP Readings from Last 2 Encounters:   06/11/14 120/63   05/28/14 154/108     Pt scheduling OBGYN exam.   MMG will be done with OBGYN    ROS  Review of Systems   HENT: Negative for ear pain, hearing loss and tinnitus.    Eyes: Negative for blurred vision and double vision.   Respiratory: Negative for cough, hemoptysis, sputum production, shortness of breath and wheezing.    Cardiovascular: Negative for chest pain, palpitations, orthopnea, claudication and leg swelling.   Gastrointestinal: Negative for heartburn, nausea, vomiting, abdominal pain and diarrhea.   Genitourinary: Negative for dysuria, urgency, frequency and hematuria.   Musculoskeletal: Negative for myalgias and neck pain.   Neurological: Positive for dizziness. Negative for tingling, tremors, sensory change and headaches.       Visit Vitals   Item Reading   ??? BP 120/63 mmHg   ??? Pulse 98   ??? Temp(Src) 97.6 ??F (36.4 ??C) (Oral)   ??? Resp 16   ??? Ht 5\' 6"  (1.676 m)   ??? Wt 134 lb 9.6 oz (61.054 kg)   ??? BMI 21.74 kg/m2   ??? SpO2 99%        Physical Exam   Constitutional: She is oriented to person, place, and time. No distress.   HENT:   Head: Normocephalic and atraumatic.   Mouth/Throat: No oropharyngeal exudate.   Eyes: Pupils are equal, round, and reactive to light.   Neck: Normal range of motion. Neck supple. No thyromegaly present.   Cardiovascular: Normal rate, regular rhythm and normal heart sounds.  Exam reveals no gallop and no friction rub.    No murmur heard.  Pulmonary/Chest: Effort normal and breath sounds normal. No respiratory distress. She has no wheezes.   Musculoskeletal: Normal range of motion. She exhibits no edema.   Neurological: She is alert and oriented to person, place, and time.   Skin: Skin is warm and dry. She is not diaphoretic. No erythema.   Psychiatric: Affect and judgment normal.       Current Outpatient Prescriptions   Medication Sig   ??? lisinopril-hydrochlorothiazide (PRINZIDE, ZESTORETIC) 20-12.5 mg per tablet Take 1 Tab by mouth daily.   ??? butalbital-acetaminophen-caffeine (FIORICET, ESGIC) 50-325-40 mg per tablet Take 1 Tab by mouth every six (6) hours as needed for Pain. Max Daily Amount: 4 Tabs.  No current facility-administered medications for this visit.        Past Medical History   Diagnosis Date   ??? GERD (gastroesophageal reflux disease)    ??? Hypertension    ??? Pap smear July 29, 2007   ??? Asthma    ??? Headache    ??? Chronic kidney disease       Past Surgical History   Procedure Laterality Date   ??? Hx hernia repair  1998   ??? Hx tubal ligation  2004   ??? Hx gyn       D&C      History   Substance Use Topics   ??? Smoking status: Never Smoker    ??? Smokeless tobacco: Never Used   ??? Alcohol Use: 1.5 oz/week     3 Glasses of wine per week      Comment: occ      Family History   Problem Relation Age of Onset   ??? Hypertension Mother    ??? Hypertension Father    ??? Hypertension Brother    ??? Cancer Maternal Grandmother    ??? Cancer Paternal Aunt      pancreatic        Allergies   Allergen Reactions    ??? Compazine [Prochlorperazine Edisylate] Other (comments)     Lock muscle   ??? Dilaudid [Hydromorphone (Pf)] Itching   ??? Percocet [Oxycodone-Acetaminophen] Itching   ??? Prednisone Swelling     Throat swelling        Assessment/Plan  Vanessa Kramer was seen today for hypertension.    Diagnoses and all orders for this visit:    Essential hypertension - blood pressure looks good. Pt to keep BP log. If goes too low or if continuing to have symptoms, will consider decreasing dose        Follow-up Disposition:  Return in about 6 months (around 12/11/2014) for Complete Physical Exam.    Vanessa Register, MD  06/11/2014

## 2014-06-11 NOTE — Progress Notes (Signed)
1. Have you been to the ER, urgent care clinic since your last visit?  Hospitalized since your last visit? NO    2. Have you seen or consulted any other health care providers outside of the [facility] since your last visit?  Include any pap smears or colon screening.  No        Pt is in the office for two week hypertension follow up visit. Pt states since last Wednesday she hasn't been able to eat much, due to food making her want to get sick. Pt states she is sexually active, but tubes are tied.

## 2014-06-15 ENCOUNTER — Encounter

## 2014-06-15 MED ORDER — LISINOPRIL 40 MG TAB
40 mg | ORAL_TABLET | Freq: Every day | ORAL | Status: DC
Start: 2014-06-15 — End: 2014-07-19

## 2014-06-15 NOTE — Telephone Encounter (Signed)
Pt states that she has been taking lisinopril/HCTZ for 3 weeks now and medication in making her dizzy and feel very drained. Pt sates is there another medication that she can try. Md made aware.

## 2014-06-15 NOTE — Telephone Encounter (Signed)
Let her know that she can stop the current medication and go just on Lisinopril, but at the 40mg  dose. I have sent this into [facility].

## 2014-06-15 NOTE — Telephone Encounter (Signed)
Pt called back. [phone]

## 2014-06-15 NOTE — Telephone Encounter (Signed)
Patient is unable to take her blood pressure medication. Her no is [phone]

## 2014-06-15 NOTE — Telephone Encounter (Signed)
Returned pt call. No answer. L/Vm to return call.

## 2014-06-15 NOTE — Telephone Encounter (Signed)
Phoned pt again. Not avail. L/Vm to return call.

## 2014-06-16 NOTE — Telephone Encounter (Signed)
Phoned pt and informed of Dr. Colvin Caroliavussin's recommendations below. Pt verbalized understanding.

## 2014-07-19 MED ORDER — LISINOPRIL 40 MG TAB
40 mg | ORAL_TABLET | ORAL | Status: DC
Start: 2014-07-19 — End: 2014-11-16

## 2014-08-24 ENCOUNTER — Encounter

## 2014-08-24 MED ORDER — BUTALBITAL-ACETAMINOPHEN-CAFFEINE 50 MG-325 MG-40 MG TAB
50-325-40 mg | ORAL_TABLET | Freq: Four times a day (QID) | ORAL | Status: DC | PRN
Start: 2014-08-24 — End: 2015-02-23

## 2014-08-24 NOTE — Telephone Encounter (Signed)
From: Vanessa ShellingKaren C Wilshire  To: Cordella RegisterJeremy Ravussin, MD  Sent: 08/24/2014 2:38 PM EDT  Subject:  Medication Renewal Request    Original  authorizing provider: Cordella RegisterJeremy Ravussin, MD    Vanessa ShellingKaren  C Kramer would like a refill of the following medications:  butalbital-acetaminophen-caffeine  (FIORICET, ESGIC) 50-325-40 mg per tablet [name][Jeremy Ravussin, MD]    [facility]  [id number] [place] - [address]    Comment:

## 2014-11-16 MED ORDER — LISINOPRIL 40 MG TAB
40 mg | ORAL_TABLET | Freq: Every day | ORAL | 0 refills | Status: DC
Start: 2014-11-16 — End: 2015-02-11

## 2014-11-16 NOTE — Telephone Encounter (Signed)
From: Cephus Shelling  To: [name], MD  Sent: 11/16/2014 11:11 AM EDT  Subject:  Medication Renewal Request    Original  authorizing provider: [name], MD    Cephus Shelling would like a refill of the following medications:  lisinopril  (PRINIVIL, ZESTRIL) 40 mg tablet [name], MD]    Preferred  pharmacy: [id number] - [place], [place] - [address]    Comment:

## 2015-02-11 ENCOUNTER — Ambulatory Visit: Admit: 2015-02-11 | Discharge: 2015-02-11 | Attending: Internal Medicine | Primary: Internal Medicine

## 2015-02-11 DIAGNOSIS — I10 Essential (primary) hypertension: Secondary | ICD-10-CM

## 2015-02-11 MED ORDER — LISINOPRIL 20 MG TAB
20 mg | ORAL_TABLET | Freq: Every day | ORAL | 2 refills | Status: DC
Start: 2015-02-11 — End: 2015-02-23

## 2015-02-11 NOTE — Progress Notes (Signed)
Vanessa Kramer is a 44 y.o. female who presents today for Foot Swelling; Hypertension; and Finger Swelling    She has a history of   Patient Active Problem List   Diagnosis Code   ??? Dizziness R42   ??? HTN (hypertension) I10   ??? Vertigo R42   ??? Pharyngitis J02.9   ??? Asthma J45.909     Today patient is here for HTN. She has not followed up since march.   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      she does have other concerns.    Problem visit:  Vanessa Kramer is here for complaint of bilateral feet/leg swelling.   Problem began 2 day(s) ago.  Severity is mild  This has been due to being up/standing for some time.   Associated symptoms include: none  Treatments tried include: medication not used    Hypertension - not taking any meds at this time. Pt has had a couple palpitations. Unknown what her BP was. No CP or blurry vision. Denies any h/a.   Hypertension ROS: not taking medications regularly as instructed, no chest pain on exertion, no dyspnea on exertion, noting swelling of ankles     reports that she has never smoked. She has never used smokeless tobacco.    reports that she drinks about 1.5 oz of alcohol per week    BP Readings from Last 2 Encounters:   02/11/15 (!) 160/100   06/11/14 120/63     ROS  Review of Systems   Constitutional: Negative for chills, fever and weight loss.   HENT: Negative for congestion and sore throat.    Eyes: Negative for blurred vision, double vision and photophobia.   Respiratory: Negative for cough, hemoptysis, sputum production and shortness of breath.    Cardiovascular: Positive for palpitations and leg swelling. Negative for chest pain, orthopnea, claudication and PND.   Gastrointestinal: Negative for abdominal pain, constipation, diarrhea, heartburn, nausea and vomiting.   Genitourinary: Negative for dysuria, frequency and urgency.   Musculoskeletal: Negative for joint pain and myalgias.   Skin: Negative for rash.   Neurological: Negative.  Negative for headaches.    Endo/Heme/Allergies: Does not bruise/bleed easily.   Psychiatric/Behavioral: Negative for depression, memory loss and suicidal ideas. The patient is not nervous/anxious.        Visit Vitals   ??? BP (!) 160/100 (BP 1 Location: Left arm, BP Patient Position: Sitting)   ??? Pulse 91   ??? Temp 98 ??F (36.7 ??C) (Oral)   ??? Resp 16   ??? Ht  (1.676 m)   ??? Wt 131 lb (59.4 kg)   ??? SpO2 98%   ??? BMI 21.14 kg/m2       Physical Exam   Constitutional: She is oriented to person, place, and time and well-developed, well-nourished, and in no distress. No distress.   HENT:   Head: Normocephalic and atraumatic.   Mouth/Throat: No oropharyngeal exudate.   Eyes: Conjunctivae are normal. Pupils are equal, round, and reactive to light. No scleral icterus.   Neck: Normal range of motion. No JVD present. No thyromegaly present.   Cardiovascular: Normal rate and regular rhythm.  Exam reveals no gallop and no friction rub.    No murmur heard.  Pulmonary/Chest: Effort normal and breath sounds normal. No respiratory distress. She has no wheezes.   Abdominal: Soft. Bowel sounds are normal. She exhibits no distension. There is no rebound and no guarding.   Musculoskeletal: Normal range  of motion. She exhibits no edema.   Neurological: She is alert and oriented to person, place, and time. No cranial nerve deficit.   Skin: Skin is dry. No rash noted.   Psychiatric: Mood, memory, affect and judgment normal.       Current Outpatient Prescriptions   Medication Sig   ??? lisinopril (PRINIVIL, ZESTRIL) 20 mg tablet Take 2 Tabs by mouth daily.   ??? butalbital-acetaminophen-caffeine (FIORICET, ESGIC) 50-325-40 mg per tablet Take 1 Tab by mouth every six (6) hours as needed for Pain. Max Daily Amount: 4 Tabs.     No current facility-administered medications for this visit.         Past Medical History   Diagnosis Date   ??? Asthma    ??? Chronic kidney disease    ??? GERD (gastroesophageal reflux disease)    ??? Headache    ??? Hypertension    ??? Pap smear July 29, 2007       Past Surgical History   Procedure Laterality Date   ??? Hx hernia repair  1998   ??? Hx tubal ligation  2004   ??? Hx gyn       D&C      Social History   Substance Use Topics   ??? Smoking status: Never Smoker   ??? Smokeless tobacco: Never Used   ??? Alcohol use 1.5 oz/week     3 Glasses of wine per week      Comment: occ      Family History   Problem Relation Age of Onset   ??? Hypertension Mother    ??? Hypertension Father    ??? Hypertension Brother    ??? Cancer Maternal Grandmother    ??? Cancer Paternal Aunt      pancreatic        Allergies   Allergen Reactions   ??? Compazine [Prochlorperazine Edisylate] Other (comments)     Lock muscle   ??? Dilaudid [Hydromorphone (Pf)] Itching   ??? Percocet [Oxycodone-Acetaminophen] Itching   ??? Prednisone Swelling     Throat swelling        Assessment/Plan  [name] was seen today for foot swelling, hypertension and finger swelling.    Diagnoses and all orders for this visit:    Essential hypertension - Uncontrolled. Has been off meds for several mos. Pt to restart ACEi. Pt has had tubal ligation. 20mg  two daily as the 40mg  dose is not on $4 formulary. Check renal function  -     METABOLIC PANEL, BASIC  -     lisinopril (PRINIVIL, ZESTRIL) 20 mg tablet; Take 2 Tabs by mouth daily.    Palpitations - Can be from elevated BP. No CP. Pt also notes heavy menstrual bleeding. Will check CBC.   -     CBC WITH AUTOMATED DIFF    Edema - with long time standing. Bilateral. Pt to get compression hose.      Follow-up Disposition:  Return in about 1 month (around 03/14/2015) for Physical.    Cordella RegisterJeremy Vonette Grosso, MD  02/11/2015

## 2015-02-11 NOTE — Patient Instructions (Signed)
DASH Diet: Care Instructions  Your Care Instructions  The DASH diet is an eating plan that can help lower your blood pressure. DASH stands for Dietary Approaches to Stop Hypertension. Hypertension is high blood pressure.  The DASH diet focuses on eating foods that are high in calcium, potassium, and magnesium. These nutrients can lower blood pressure. The foods that are highest in these nutrients are fruits, vegetables, low-fat dairy products, nuts, seeds, and legumes. But taking calcium, potassium, and magnesium supplements instead of eating foods that are high in those nutrients does not have the same effect. The DASH diet also includes whole grains, fish, and poultry.  The DASH diet is one of several lifestyle changes your doctor may recommend to lower your high blood pressure. Your doctor may also want you to decrease the amount of sodium in your diet. Lowering sodium while following the DASH diet can lower blood pressure even further than just the DASH diet alone.  Follow-up care is a key part of your treatment and safety. Be sure to make and go to all appointments, and call your doctor if you are having problems. It's also a good idea to know your test results and keep a list of the medicines you take.  How can you care for yourself at home?  Following the DASH diet  ?? Eat 4 to 5 servings of fruit each day. A serving is 1 medium-sized piece of fruit, ?? cup chopped or canned fruit, 1/4 cup dried fruit, or 4 ounces (?? cup) of fruit juice. Choose fruit more often than fruit juice.  ?? Eat 4 to 5 servings of vegetables each day. A serving is 1 cup of lettuce or raw leafy vegetables, ?? cup of chopped or cooked vegetables, or 4 ounces (?? cup) of vegetable juice. Choose vegetables more often than vegetable juice.  ?? Get 2 to 3 servings of low-fat and fat-free dairy each day. A serving is 8 ounces of milk, 1 cup of yogurt, or 1 ?? ounces of cheese.   ?? Eat 6 to 8 servings of grains each day. A serving is 1 slice of bread, 1 ounce of dry cereal, or ?? cup of cooked rice, pasta, or cooked cereal. Try to choose whole-grain products as much as possible.  ?? Limit lean meat, poultry, and fish to 2 servings each day. A serving is 3 ounces, about the size of a deck of cards.  ?? Eat 4 to 5 servings of nuts, seeds, and legumes (cooked dried beans, lentils, and split peas) each week. A serving is 1/3 cup of nuts, 2 tablespoons of seeds, or ?? cup of cooked beans or peas.  ?? Limit fats and oils to 2 to 3 servings each day. A serving is 1 teaspoon of vegetable oil or 2 tablespoons of salad dressing.  ?? Limit sweets and added sugars to 5 servings or less a week. A serving is 1 tablespoon jelly or jam, ?? cup sorbet, or 1 cup of lemonade.  ?? Eat less than 2,300 milligrams (mg) of sodium a day. If you limit your sodium to 1,500 mg a day, you can lower your blood pressure even more.  Tips for success  ?? Start small. Do not try to make dramatic changes to your diet all at once. You might feel that you are missing out on your favorite foods and then be more likely to not follow the plan. Make small changes, and stick with them. Once those changes become habit, add a few more   changes.  ?? Try some of the following:  ?? Make it a goal to eat a fruit or vegetable at every meal and at snacks. This will make it easy to get the recommended amount of fruits and vegetables each day.  ?? Try yogurt topped with fruit and nuts for a snack or healthy dessert.  ?? Add lettuce, tomato, cucumber, and onion to sandwiches.  ?? Combine a ready-made pizza crust with low-fat mozzarella cheese and lots of vegetable toppings. Try using tomatoes, squash, spinach, broccoli, carrots, cauliflower, and onions.  ?? Have a variety of cut-up vegetables with a low-fat dip as an appetizer instead of chips and dip.  ?? Sprinkle sunflower seeds or chopped almonds over salads. Or try adding  chopped walnuts or almonds to cooked vegetables.  ?? Try some vegetarian meals using beans and peas. Add garbanzo or kidney beans to salads. Make burritos and tacos with mashed pinto beans or black beans.  Where can you learn more?  Go to http://www.healthwise.net/GoodHelpConnections  Enter H967 in the search box to learn more about "DASH Diet: Care Instructions."  ?? 2006-2016 [employer]. Care instructions adapted under license by [employer] (which disclaims liability or warranty for this information). This care instruction is for use with your licensed healthcare professional. If you have questions about a medical condition or this instruction, always ask your healthcare professional. Healthwise, Incorporated disclaims any warranty or liability for your use of this information.  Content Version: 11.0.578772; Current as of: May 20, 2014

## 2015-02-12 LAB — METABOLIC PANEL, BASIC
BUN/Creatinine ratio: 13 (ref 9–23)
BUN: 10 mg/dL (ref 6–24)
CO2: 24 mmol/L (ref 18–29)
Calcium: 9.1 mg/dL (ref 8.7–10.2)
Chloride: 104 mmol/L (ref 96–106)
Creatinine: 0.79 mg/dL (ref 0.57–1.00)
GFR est AA: 105 mL/min/{1.73_m2} (ref >59)
GFR est non-AA: 91 mL/min/{1.73_m2} (ref >59)
Glucose: 102 mg/dL — ABNORMAL HIGH (ref 65–99)
Potassium: 4 mmol/L (ref 3.5–5.2)
Sodium: 141 mmol/L (ref 134–144)

## 2015-02-12 LAB — CBC WITH AUTOMATED DIFF
ABS. BASOPHILS: 0 10*3/uL (ref 0.0–0.2)
ABS. EOSINOPHILS: 0.2 10*3/uL (ref 0.0–0.4)
ABS. IMM. GRANS.: 0 10*3/uL (ref 0.0–0.1)
ABS. MONOCYTES: 0.3 10*3/uL (ref 0.1–0.9)
ABS. NEUTROPHILS: 2.8 10*3/uL (ref 1.4–7.0)
Abs Lymphocytes: 1.9 10*3/uL (ref 0.7–3.1)
BASOPHILS: 1 %
EOSINOPHILS: 5 %
HCT: 29.6 % — ABNORMAL LOW (ref 34.0–46.6)
HGB: 10 g/dL — ABNORMAL LOW (ref 11.1–15.9)
IMMATURE GRANULOCYTES: 0 %
Lymphocytes: 35 %
MCH: 29.1 pg (ref 26.6–33.0)
MCHC: 33.8 g/dL (ref 31.5–35.7)
MCV: 86 fL (ref 79–97)
MONOCYTES: 6 %
NEUTROPHILS: 53 %
PLATELET: 307 10*3/uL (ref 150–379)
RBC: 3.44 x10E6/uL — ABNORMAL LOW (ref 3.77–5.28)
RDW: 13.8 % (ref 12.3–15.4)
WBC: 5.3 10*3/uL (ref 3.4–10.8)

## 2015-02-13 ENCOUNTER — Encounter

## 2015-02-13 MED ORDER — FERROUS SULFATE SR 47.5 MG (IRON) TAB
47.5 mg iron | ORAL_TABLET | Freq: Two times a day (BID) | ORAL | 3 refills | Status: DC
Start: 2015-02-13 — End: 2015-11-23

## 2015-02-13 NOTE — Progress Notes (Signed)
Anemia likely from recent DUB, Fe supplement sent in.   Message sent to pt.

## 2015-02-23 ENCOUNTER — Ambulatory Visit: Admit: 2015-02-23 | Discharge: 2015-02-23 | Attending: Internal Medicine | Primary: Internal Medicine

## 2015-02-23 ENCOUNTER — Inpatient Hospital Stay: Admit: 2015-02-25 | Payer: Charity | Primary: Internal Medicine

## 2015-02-23 ENCOUNTER — Encounter

## 2015-02-23 DIAGNOSIS — Z0000 Encounter for general adult medical examination without abnormal findings: Secondary | ICD-10-CM

## 2015-02-23 MED ORDER — BENAZEPRIL 40 MG TAB
40 mg | ORAL_TABLET | Freq: Every day | ORAL | 3 refills | Status: DC
Start: 2015-02-23 — End: 2015-07-12

## 2015-02-23 MED ORDER — ATENOLOL 25 MG TAB
25 mg | ORAL_TABLET | Freq: Every day | ORAL | 3 refills | Status: DC
Start: 2015-02-23 — End: 2015-07-12

## 2015-02-23 NOTE — Patient Instructions (Signed)
Well Visit, Ages 18 to 50: Care Instructions  Your Care Instructions  Physical exams can help you stay healthy. Your doctor has checked your overall health and may have suggested ways to take good care of yourself. He or she also may have recommended tests. At home, you can help prevent illness with healthy eating, regular exercise, and other steps.  Follow-up care is a key part of your treatment and safety. Be sure to make and go to all appointments, and call your doctor if you are having problems. It's also a good idea to know your test results and keep a list of the medicines you take.  How can you care for yourself at home?  ?? Reach and stay at a healthy weight. This will lower your risk for many problems, such as obesity, diabetes, heart disease, and high blood pressure.  ?? Get at least 30 minutes of physical activity on most days of the week. Walking is a good choice. You also may want to do other activities, such as running, swimming, cycling, or playing tennis or team sports. Discuss any changes in your exercise program with your doctor.  ?? Do not smoke or allow others to smoke around you. If you need help quitting, talk to your doctor about stop-smoking programs and medicines. These can increase your chances of quitting for good.  ?? Talk to your doctor about whether you have any risk factors for sexually transmitted infections (STIs). Having one sex partner (who does not have STIs and does not have sex with anyone else) is a good way to avoid these infections.  ?? Use birth control if you do not want to have children at this time. Talk with your doctor about the choices available and what might be best for you.  ?? Protect your skin from too much sun. When you're outdoors from 10 a.m. to 4 p.m., stay in the shade or cover up with clothing and a hat with a wide brim. Wear sunglasses that block UV rays. Even when it's cloudy, put broad-spectrum sunscreen (SPF 30 or higher) on any exposed skin.   ?? See a dentist one or two times a year for checkups and to have your teeth cleaned.  ?? Wear a seat belt in the car.  ?? Drink alcohol in moderation, if at all. That means no more than 2 drinks a day for men and 1 drink a day for women.  Follow your doctor's advice about when to have certain tests. These tests can spot problems early.  For everyone  ?? Cholesterol. Have the fat (cholesterol) in your blood tested after age 20. Your doctor will tell you how often to have this done based on your age, family history, or other things that can increase your risk for heart disease.  ?? Blood pressure. Have your blood pressure checked during a routine doctor visit. Your doctor will tell you how often to check your blood pressure based on your age, your blood pressure results, and other factors.  ?? Vision. Talk with your doctor about how often to have a glaucoma test.  ?? Diabetes. Ask your doctor whether you should have tests for diabetes.  ?? Colon cancer. Have a test for colon cancer at age 50. You may have one of several tests. If you are younger than 50, you may need a test earlier if you have any risk factors. Risk factors include whether you already had a precancerous polyp removed from your colon or whether your parent, brother,   sister, or child has had colon cancer.  For women  ?? Breast exam and mammogram. Talk to your doctor about when you should have a clinical breast exam and a mammogram. Medical experts differ on whether and how often women under 50 should have these tests. Your doctor can help you decide what is right for you.  ?? Pap test and pelvic exam. Begin Pap tests at age 21. A Pap test is the best way to find cervical cancer. The test often is part of a pelvic exam. Ask how often to have this test.  ?? Tests for sexually transmitted infections (STIs). Ask whether you should have tests for STIs. You may be at risk if you have sex with more than one person, especially if your partners do not wear condoms.   For men  ?? Tests for sexually transmitted infections (STIs). Ask whether you should have tests for STIs. You may be at risk if you have sex with more than one person, especially if you do not wear a condom.  ?? Testicular cancer exam. Ask your doctor whether you should check your testicles regularly.  ?? Prostate exam. Talk to your doctor about whether you should have a blood test (called a PSA test) for prostate cancer. Experts differ on whether and when men should have this test. Some experts suggest it if you are older than 45 and are African-American or have a father or brother who got prostate cancer when he was younger than 65.  When should you call for help?  Watch closely for changes in your health, and be sure to contact your doctor if you have any problems or symptoms that concern you.  Where can you learn more?  Go to http://www.healthwise.net/GoodHelpConnections.  Enter P072 in the search box to learn more about "Well Visit, Ages 18 to 50: Care Instructions."  Current as of: September 15, 2014  Content Version: 11.1  ?? 2006-2016 [employer]. Care instructions adapted under license by [employer] (which disclaims liability or warranty for this information). If you have questions about a medical condition or this instruction, always ask your healthcare professional. [employer]wise, [employer] disclaims any warranty or liability for your use of this information.

## 2015-02-23 NOTE — Progress Notes (Signed)
Vanessa ShellingKaren C Kramer is a 44 y.o. female who presents today for Complete Physical and Gyn Exam      She has a history of   Patient Active Problem List   Diagnosis Code   ??? Dizziness R42   ??? HTN (hypertension) I10   ??? Vertigo R42   ??? Pharyngitis J02.9   ??? Asthma J45.909       Today patient is here for CPE and BP check.   she does not have other concerns.    HTN: restarted on ACEi at last visit. Taking 40mg . She had stopped this for some time. No H/A. No blurry vision. Initially elevated. On recheck mildly higher than we would want it.     Health maintenance hx includes:  Exercise: moderately active.  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    Diet: generally follows a low sodium diet, nonsmoker, alcohol intake socially  Social: working at [employer]. At home with 4 boys.     Screening:    Colon cancer screening:  Last Colonoscopy: in 2006, due to constipation.    Breast cancer screening: last mammogram about 10 yrs ago.  and   was normal   Cervical cancer screening: last PAP/Pelvic exam: 2012   and was normal   Osteoporosis screening:  Last BMD:N/A.       Immunizations:     There is no immunization history on file for this patient.   Immunization status: Tdap UTD. [name].      ROS  Review of Systems   Constitutional: Positive for malaise/fatigue. Negative for chills, diaphoresis, fever and weight loss.   HENT: Negative for congestion and sore throat.    Eyes: Negative for blurred vision, double vision and photophobia.   Respiratory: Negative for cough, hemoptysis, sputum production and shortness of breath.    Cardiovascular: Negative for chest pain, palpitations, orthopnea, claudication and leg swelling.   Gastrointestinal: Negative for abdominal pain, constipation, diarrhea, heartburn, nausea and vomiting.   Genitourinary: Negative for dysuria, frequency and urgency.   Musculoskeletal: Negative for joint pain and myalgias.   Skin: Negative for rash.   Neurological: Negative.  Negative for weakness and headaches.    Endo/Heme/Allergies: Does not bruise/bleed easily.   Psychiatric/Behavioral: Negative for depression, memory loss, substance abuse and suicidal ideas.       Visit Vitals   ??? BP 140/85 (BP 1 Location: Left arm, BP Patient Position: Sitting)   ??? Pulse 96   ??? Temp 98.6 ??F (37 ??C) (Oral)   ??? Resp 16   ??? Ht 5\' 6"  (1.676 m)   ??? Wt 130 lb (59 kg)   ??? SpO2 98%   ??? BMI 20.98 kg/m2       Physical Exam   Constitutional: She is oriented to person, place, and time and well-developed, well-nourished, and in no distress. No distress.   HENT:   Head: Normocephalic and atraumatic.   Mouth/Throat: No oropharyngeal exudate.   Eyes: Conjunctivae are normal. Pupils are equal, round, and reactive to light. No scleral icterus.   Neck: Normal range of motion. No JVD present. No thyromegaly present.   Cardiovascular: Normal rate and regular rhythm.  Exam reveals no gallop and no friction rub.    No murmur heard.  Pulmonary/Chest: Effort normal and breath sounds normal. No respiratory distress. She has no wheezes. She exhibits no mass and no edema. Right breast exhibits no inverted nipple, no nipple discharge, no skin change and no tenderness. Left breast exhibits no inverted nipple,  no nipple discharge, no skin change and no tenderness. Breasts are symmetrical.   Abdominal: Soft. Bowel sounds are normal. She exhibits no distension. There is no rebound and no guarding.   Genitourinary: Vagina normal, uterus normal, cervix normal, right adnexa normal and left adnexa normal. No vaginal discharge found.   Musculoskeletal: Normal range of motion. She exhibits no edema.   Neurological: She is alert and oriented to person, place, and time. No cranial nerve deficit.   Skin: Skin is dry. No rash noted.   Several tattoos   Psychiatric: Mood, memory, affect and judgment normal.       Current Outpatient Prescriptions   Medication Sig   ??? benazepril (LOTENSIN) 40 mg tablet Take 1 Tab by mouth daily.    ??? atenolol (TENORMIN) 25 mg tablet Take 1 Tab by mouth daily.   ??? Ferrous Sulfate (SLOW FE) 47.5 mg iron TbER tablet Take 1 Tab by mouth two (2) times daily (with meals).   ??? butalbital-acetaminophen-caffeine (FIORICET, ESGIC) 50-325-40 mg per tablet Take 1 Tab by mouth every six (6) hours as needed for Pain. Max Daily Amount: 4 Tabs.     No current facility-administered medications for this visit.         Past Medical History   Diagnosis Date   ??? Asthma    ??? Chronic kidney disease    ??? GERD (gastroesophageal reflux disease)    ??? Headache    ??? Hypertension    ??? Pap smear July 29, 2007      Past Surgical History   Procedure Laterality Date   ??? Hx hernia repair  1998   ??? Hx tubal ligation  2004   ??? Hx gyn       D&C      Social History   Substance Use Topics   ??? Smoking status: Never Smoker   ??? Smokeless tobacco: Never Used   ??? Alcohol use 1.5 oz/week     3 Glasses of wine per week      Comment: occ      Family History   Problem Relation Age of Onset   ??? Hypertension Mother    ??? Hypertension Father    ??? Hypertension Brother    ??? Cancer Maternal Grandmother    ??? Cancer Paternal Aunt      pancreatic        Allergies   Allergen Reactions   ??? Compazine [Prochlorperazine Edisylate] Other (comments)     Lock muscle   ??? Dilaudid [Hydromorphone (Pf)] Itching   ??? Percocet [Oxycodone-Acetaminophen] Itching   ??? Prednisone Swelling     Throat swelling        Assessment/Plan  Vanessa Kramer was seen today for complete physical and gyn exam.    Diagnoses and all orders for this visit:    Wellness examination - HM UTD with exception of MMG. To do today. She is encouraged to watch her salt intake.   Vanessa Kramer was counseled on age-appropriate/ guideline-based risk prevention behaviors and screening for a 44 y.o. year old   female .  We also discussed adjustments in screening based on family history if necessary.   Printed instructions for preventative screening guidelines and healthy behaviors given to patient with after visit summary.     -     PAP IG, APTIMA HPV AND RFX 16/18,45 ([id number])    Essential hypertension - not quite to goal. Using 2x20mg  ace still cost more. Will switch to banazepril and add atenolol. Blood work  before f/u.   -     benazepril (LOTENSIN) 40 mg tablet; Take 1 Tab by mouth daily.  -     atenolol (TENORMIN) 25 mg tablet; Take 1 Tab by mouth daily.  -     CBC WITH AUTOMATED DIFF  -     METABOLIC PANEL, BASIC  -     TSH 3RD GENERATION    Anemia, unspecified type - May be cause of fatigue. Continue PO Fe.   -     CBC WITH AUTOMATED DIFF    Encounter for screening mammogram for breast cancer  -     MAM MAMMO BI SCREENING DIGTL; Future    Fatigue, unspecified type  -     TSH 3RD GENERATION    Encounter for Papanicolaou smear of cervix  -     PAP IG, APTIMA HPV AND RFX 16/10,96 ([id number])        Follow-up Disposition: Not on File    [name], MD  02/23/2015

## 2015-02-23 NOTE — Progress Notes (Signed)
Pt last pap was 2012 with dr. Tracey Harriespringle

## 2015-02-26 NOTE — Progress Notes (Signed)
Please call and let know that she had a normal PAP.

## 2015-02-26 NOTE — Telephone Encounter (Addendum)
Called pt and let her know pap was normal per Dr. Tonny Bollmanavussin. Pt states understanding.    ----- Message from Cordella RegisterJeremy Ravussin, MD sent at 02/26/2015  1:00 PM EST -----  Please call and let know that she had a normal PAP.

## 2015-03-25 ENCOUNTER — Ambulatory Visit: Primary: Internal Medicine

## 2015-03-31 NOTE — Telephone Encounter (Signed)
Patient called to report that she has been having sx of dizziness,left arm pain and chest tightness for several days. Patient reports bp reading yesterday at the grocery store was 167/100. Pt also report sx of shortness of breath. Advised of ER evaluation with pt's current sx. Pt verbalized understanding and will go.

## 2015-04-05 ENCOUNTER — Ambulatory Visit: Admit: 2015-04-05 | Attending: Internal Medicine | Primary: Internal Medicine

## 2015-04-05 DIAGNOSIS — I10 Essential (primary) hypertension: Secondary | ICD-10-CM

## 2015-04-05 NOTE — Progress Notes (Signed)
Vanessa Kramer is a 45 y.o. female who presents today for Follow-up and Sinus Pain (pt c/o sinus pressure and congestion x 3 days )  .      She has a history of   Patient Active Problem List   Diagnosis Code   ??? Dizziness R42   ??? HTN (hypertension) I10   ??? Vertigo R42   ??? Pharyngitis J02.9   ??? Asthma J45.909   .    Today patient is here for f/u.   she does have other concerns.    Upper respiratory illness:  Vanessa Kramer presents with complaints of congestion, sore throat, swollen glands, post nasal drip and bilateral sinus pain for 4 days.  no nausea and no vomiting .  she has not had  fever and chills.  Symptoms are mild.  Over-the-counter remedies including mucinex.        Hypertension - Much improved on ACE and BB. No s/e from this, but notes that she did feel a bit week last week. No issues since.   Hypertension ROS: taking medications as instructed, no medication side effects noted, no TIA's, no chest pain on exertion, no dyspnea on exertion, no swelling of ankles     reports that she has never smoked. She has never used smokeless tobacco.    reports that she drinks about 1.5 oz of alcohol per week    BP Readings from Last 2 Encounters:   04/05/15 124/84   02/23/15 140/85     Anemia: On Fe. Pt notes some heavy bleeding with last periods.     ROS  Review of Systems   Constitutional: Negative for chills, fever and weight loss.   HENT: Negative for congestion and sore throat.    Eyes: Negative for blurred vision, double vision and photophobia.   Respiratory: Negative for cough and shortness of breath.    Cardiovascular: Negative for chest pain, palpitations and leg swelling.   Gastrointestinal: Negative for abdominal pain, constipation, diarrhea, heartburn, nausea and vomiting.   Genitourinary: Negative for dysuria, frequency and urgency.   Musculoskeletal: Negative for joint pain and myalgias.   Skin: Negative for rash.   Neurological: Negative.  Negative for headaches.    Endo/Heme/Allergies: Does not bruise/bleed easily.   Psychiatric/Behavioral: Negative for memory loss and suicidal ideas.       Visit Vitals   ??? BP 124/84 (BP 1 Location: Left arm, BP Patient Position: Sitting)   ??? Pulse 87   ??? Temp 98.1 ??F (36.7 ??C) (Oral)   ??? Resp 16   ??? Ht  (1.676 m)   ??? Wt 128 lb 3.2 oz (58.2 kg)   ??? SpO2 98%   ??? BMI 20.69 kg/m2       Physical Exam   Constitutional: She is oriented to person, place, and time. She appears well-developed and well-nourished. No distress.   HENT:   Head: Normocephalic and atraumatic.   Left Ear: External ear normal.   Large amount of cerumen in R ear. Flushed   Neck: Normal range of motion. Neck supple. No thyromegaly present.   Cardiovascular: Normal rate and regular rhythm.    No murmur heard.  Pulmonary/Chest: Effort normal and breath sounds normal. She has no wheezes.   Abdominal: Soft. Bowel sounds are normal. She exhibits no distension.   Musculoskeletal: Normal range of motion. She exhibits no edema.   Neurological: She is alert and oriented to person, place, and time. No cranial nerve deficit.   Skin: Skin is  warm and dry.   Psychiatric: She has a normal mood and affect. Her behavior is normal.         Current Outpatient Prescriptions   Medication Sig   ??? benazepril (LOTENSIN) 40 mg tablet Take 1 Tab by mouth daily.   ??? atenolol (TENORMIN) 25 mg tablet Take 1 Tab by mouth daily.   ??? Ferrous Sulfate (SLOW FE) 47.5 mg iron TbER tablet Take 1 Tab by mouth two (2) times daily (with meals).     No current facility-administered medications for this visit.         Past Medical History   Diagnosis Date   ??? Asthma    ??? Chronic kidney disease    ??? GERD (gastroesophageal reflux disease)    ??? Headache    ??? Hypertension    ??? Pap smear July 29, 2007      Past Surgical History   Procedure Laterality Date   ??? Hx hernia repair  1998   ??? Hx tubal ligation  2004   ??? Hx gyn       D&C      Social History   Substance Use Topics   ??? Smoking status: Never Smoker    ??? Smokeless tobacco: Never Used   ??? Alcohol use 1.5 oz/week     3 Glasses of wine per week      Comment: occ      Family History   Problem Relation Age of Onset   ??? Hypertension Mother    ??? Hypertension Father    ??? Hypertension Brother    ??? Cancer Maternal Grandmother    ??? Cancer Paternal Aunt      pancreatic        Allergies   Allergen Reactions   ??? Compazine [Prochlorperazine Edisylate] Other (comments)     Lock muscle   ??? Dilaudid [Hydromorphone (Pf)] Itching   ??? Percocet [Oxycodone-Acetaminophen] Itching   ??? Prednisone Swelling     Throat swelling        Assessment/Plan  Vanessa Kramer was seen today for follow-up and sinus pain.    Diagnoses and all orders for this visit:    Essential hypertension - at goal. Continue current therapy.     Anemia, unspecified type - On Fe. Continue this. Studies today   Impacted cerumen of right ear - Rinsed out.     Sinusitis, unspecified chronicity, unspecified location - low likely hood bacterial. Continue mucinex.         Follow-up Disposition:  Return in about 3 months (around 07/03/2015) for BP check .    [name], MD  04/05/2015

## 2015-04-06 LAB — CBC WITH AUTOMATED DIFF
ABS. BASOPHILS: 0 10*3/uL (ref 0.0–0.2)
ABS. EOSINOPHILS: 0.3 10*3/uL (ref 0.0–0.4)
ABS. IMM. GRANS.: 0 10*3/uL (ref 0.0–0.1)
ABS. MONOCYTES: 0.7 10*3/uL (ref 0.1–0.9)
ABS. NEUTROPHILS: 3.3 10*3/uL (ref 1.4–7.0)
Abs Lymphocytes: 2.5 10*3/uL (ref 0.7–3.1)
BASOPHILS: 0 %
EOSINOPHILS: 4 %
HCT: 34 % (ref 34.0–46.6)
HGB: 11.2 g/dL (ref 11.1–15.9)
IMMATURE GRANULOCYTES: 0 %
Lymphocytes: 37 %
MCH: 29.1 pg (ref 26.6–33.0)
MCHC: 32.9 g/dL (ref 31.5–35.7)
MCV: 88 fL (ref 79–97)
MONOCYTES: 11 %
NEUTROPHILS: 48 %
PLATELET: 271 10*3/uL (ref 150–379)
RBC: 3.85 x10E6/uL (ref 3.77–5.28)
RDW: 15 % (ref 12.3–15.4)
WBC: 6.9 10*3/uL (ref 3.4–10.8)

## 2015-04-06 LAB — METABOLIC PANEL, BASIC
BUN/Creatinine ratio: 14 (ref 9–23)
BUN: 12 mg/dL (ref 6–24)
CO2: 24 mmol/L (ref 18–29)
Calcium: 9.4 mg/dL (ref 8.7–10.2)
Chloride: 100 mmol/L (ref 96–106)
Creatinine: 0.85 mg/dL (ref 0.57–1.00)
GFR est AA: 96 mL/min/{1.73_m2} (ref >59)
GFR est non-AA: 83 mL/min/{1.73_m2} (ref >59)
Glucose: 99 mg/dL (ref 65–99)
Potassium: 4.3 mmol/L (ref 3.5–5.2)
Sodium: 140 mmol/L (ref 134–144)

## 2015-04-06 LAB — TSH 3RD GENERATION: TSH: 1.79 u[IU]/mL (ref 0.450–4.500)

## 2015-04-07 NOTE — Progress Notes (Signed)
Message sent. CBC normal

## 2015-04-13 ENCOUNTER — Ambulatory Visit: Primary: Internal Medicine

## 2015-07-12 ENCOUNTER — Encounter

## 2015-07-12 NOTE — Telephone Encounter (Signed)
Pt wants to pick up rxs to bring to another pharmacy

## 2015-07-12 NOTE — Telephone Encounter (Signed)
Pt is overdue for BP check. Pt states she is not able to come in as she does not have health insurance and did not qualify for Care Card.   Pt is asking for printed Rx to be able to find cheapest options.

## 2015-07-13 MED ORDER — BENAZEPRIL 40 MG TAB
40 mg | ORAL_TABLET | Freq: Every day | ORAL | 5 refills | Status: DC
Start: 2015-07-13 — End: 2016-02-07

## 2015-07-13 MED ORDER — ATENOLOL 25 MG TAB
25 mg | ORAL_TABLET | Freq: Every day | ORAL | 5 refills | Status: DC
Start: 2015-07-13 — End: 2015-10-22

## 2015-07-13 NOTE — Telephone Encounter (Signed)
Rx are ready for Pick up. LVM to notify Pt.

## 2015-10-15 ENCOUNTER — Encounter

## 2015-10-18 NOTE — Telephone Encounter (Signed)
error 

## 2015-10-22 MED ORDER — ATENOLOL 50 MG TAB
50 mg | ORAL_TABLET | Freq: Every day | ORAL | 2 refills | Status: DC
Start: 2015-10-22 — End: 2016-02-07

## 2015-10-22 NOTE — Telephone Encounter (Signed)
Atenolol 25 mg tablet is on backorder at all the pharmacies. Patient needs order for the 50 mg to use 1/2 tablet daily.

## 2015-10-28 ENCOUNTER — Encounter: Attending: Internal Medicine | Primary: Internal Medicine

## 2015-11-11 ENCOUNTER — Inpatient Hospital Stay: Admit: 2015-11-11 | Payer: BLUE CROSS/BLUE SHIELD | Attending: Internal Medicine | Primary: Internal Medicine

## 2015-11-11 DIAGNOSIS — Z1231 Encounter for screening mammogram for malignant neoplasm of breast: Secondary | ICD-10-CM

## 2015-11-12 NOTE — Progress Notes (Signed)
Letter sent.

## 2015-11-23 ENCOUNTER — Ambulatory Visit: Admit: 2015-11-23 | Payer: PRIVATE HEALTH INSURANCE | Attending: Internal Medicine | Primary: Internal Medicine

## 2015-11-23 DIAGNOSIS — J0100 Acute maxillary sinusitis, unspecified: Secondary | ICD-10-CM

## 2015-11-23 MED ORDER — AMOXICILLIN CLAVULANATE 875 MG-125 MG TAB
875-125 mg | ORAL_TABLET | Freq: Two times a day (BID) | ORAL | 0 refills | Status: DC
Start: 2015-11-23 — End: 2015-12-07

## 2015-11-23 MED ORDER — ALBUTEROL SULFATE HFA 90 MCG/ACTUATION AEROSOL INHALER
90 mcg/actuation | RESPIRATORY_TRACT | 1 refills | Status: AC | PRN
Start: 2015-11-23 — End: ?

## 2015-11-23 NOTE — Progress Notes (Signed)
HISTORY OF PRESENT ILLNESS  Vanessa Kramer is a 45 y.o. female.  HPI  Upper respiratory illness:  Vanessa Kramer presents with complaints of congestion, post nasal drip, right, lower sinus pain, chills and chest congestion/ tightness for 2 weeks.   nausea and no vomiting .  she has not had  sore throat, productive cough and fever.  Symptoms are moderate.  Over-the-counter remedies including benadryl, mucinex   has been used with good relief of symptoms.  Drinking plenty of fluids: yes  Asthma?:  yes  non-smoker  Contacts with similar infections: yes     Patient Active Problem List   Diagnosis Code   ??? Dizziness R42   ??? HTN (hypertension) I10   ??? Vertigo R42   ??? Pharyngitis J02.9   ??? Asthma J45.909     Past Medical History:   Diagnosis Date   ??? Asthma    ??? Chronic kidney disease    ??? GERD (gastroesophageal reflux disease)    ??? Headache    ??? Hypertension    ??? Pap smear July 29, 2007     Allergies   Allergen Reactions   ??? Compazine [Prochlorperazine Edisylate] Other (comments)     Lock muscle   ??? Dilaudid [Hydromorphone (Pf)] Itching   ??? Percocet [Oxycodone-Acetaminophen] Itching   ??? Prednisone Swelling     Throat swelling     Current Outpatient Prescriptions on File Prior to Visit   Medication Sig Dispense Refill   ??? atenolol (TENORMIN) 50 mg tablet Take 0.5 Tabs by mouth daily. 15 Tab 2   ??? benazepril (LOTENSIN) 40 mg tablet Take 1 Tab by mouth daily. 30 Tab 5     No current facility-administered medications on file prior to visit.      Social History   Substance Use Topics   ??? Smoking status: Never Smoker   ??? Smokeless tobacco: Never Used   ??? Alcohol use 1.5 oz/week     3 Glasses of wine per week      Comment: occ             ROS    Physical Exam   Constitutional: She is oriented to person, place, and time. She appears well-developed and well-nourished. No distress.   BP 127/89 (BP 1 Location: Left arm, BP Patient Position: Sitting)   Pulse 100   Temp 98.3 ??F (36.8 ??C) (Oral)    Resp 18   Ht 5\' 6"  (1.676 m)   Wt  140 lb 6 oz (63.7 kg)   LMP 10/31/2015   SpO2 98%   BMI 22.66 kg/m2   HENT:   Right Ear: Tympanic membrane and ear canal normal.   Left Ear: Tympanic membrane and ear canal normal.   Nose: Mucosal edema and rhinorrhea present. Right sinus exhibits no maxillary sinus tenderness and no frontal sinus tenderness. Left sinus exhibits no maxillary sinus tenderness and no frontal sinus tenderness.       Mouth/Throat: Uvula is midline and mucous membranes are normal. Posterior oropharyngeal erythema present. No oropharyngeal exudate, posterior oropharyngeal edema or tonsillar abscesses.   Tender where depicted   Eyes: Conjunctivae are normal.   Neck: Neck supple.   Cardiovascular: Normal rate, regular rhythm and normal heart sounds.    Pulmonary/Chest: Effort normal and breath sounds normal. No accessory muscle usage. No respiratory distress. She has no decreased breath sounds. She has no wheezes. She has no rhonchi. She has no rales.   Lymphadenopathy:     She has no  cervical adenopathy.   Neurological: She is alert and oriented to person, place, and time.   Skin: Skin is warm and dry. She is not diaphoretic.   Nursing note and vitals reviewed.      ASSESSMENT and PLAN    ICD-10-CM ICD-9-CM    1. Acute non-recurrent maxillary sinusitis  Vanessa Kramer was diagnosed with sinusitis.  she is advised to drink plenty of water, use shower steam or humidifier to loosen secretions, saline nasal lavage 3 times daily and get plenty of rest.  she may use mucinex 1200mg  twice daily, afrin nasal spray (2 sprays each nostril twice daily for up to 5 days) along with tylenol or advil as needed for fever and pain.  Written instructions were given to the patient emphasizing these recommendations.   J01.00 461.0 amoxicillin-clavulanate (AUGMENTIN) 875-125 mg per tablet      guaiFENesin (MUCINEX) 1,200 mg Ta12 ER tablet      oxymetazoline (AFRIN, OXYMETAZOLINE,) 0.05 % nasal spray    2. Asthma exacerbation - mild - subjective.  Add albuterol prn J45.901 493.92 albuterol (PROVENTIL HFA, VENTOLIN HFA, PROAIR HFA) 90 mcg/actuation inhaler     Follow-up Disposition:  Return if symptoms worsen or fail to improve.

## 2015-11-23 NOTE — Patient Instructions (Signed)
Sinusitis: Care Instructions  Your Care Instructions    Sinusitis is an infection of the lining of the sinus cavities in your head. Sinusitis often follows a cold. It causes pain and pressure in your head and face.  In most cases, sinusitis gets better on its own in 1 to 2 weeks. But some mild symptoms may last for several weeks. Sometimes antibiotics are needed.  Follow-up care is a key part of your treatment and safety. Be sure to make and go to all appointments, and call your doctor if you are having problems. It's also a good idea to know your test results and keep a list of the medicines you take.  How can you care for yourself at home?  ?? Take an over-the-counter pain medicine, such as acetaminophen (Tylenol), ibuprofen (Advil, Motrin), or naproxen (Aleve). Read and follow all instructions on the label.  ?? If the doctor prescribed antibiotics, take them as directed. Do not stop taking them just because you feel better. You need to take the full course of antibiotics.  ?? Be careful when taking over-the-counter cold or flu medicines and Tylenol at the same time. Many of these medicines have acetaminophen, which is Tylenol. Read the labels to make sure that you are not taking more than the recommended dose. Too much acetaminophen (Tylenol) can be harmful.  ?? Breathe warm, moist air from a steamy shower, a hot bath, or a sink filled with hot water. Avoid cold, dry air. Using a humidifier in your home may help. Follow the directions for cleaning the machine.  ?? Use saline (saltwater) nasal washes to help keep your nasal passages open and wash out mucus and bacteria. You can buy saline nose drops at a grocery store or drugstore. Or you can make your own at home by adding 1 teaspoon of salt and 1 teaspoon of baking soda to 2 cups of distilled water. If you make your own, fill a bulb syringe with the solution, insert the tip into your nostril, and squeeze gently. Blow your nose.   ?? Put a hot, wet towel or a warm gel pack on your face 3 or 4 times a day for 5 to 10 minutes each time.  ?? Try a decongestant nasal spray like oxymetazoline (Afrin). Do not use it for more than 3 days in a row. Using it for more than 3 days can make your congestion worse.  When should you call for help?  Call your doctor now or seek immediate medical care if:  ?? You have new or worse swelling or redness in your face or around your eyes.  ?? You have a new or higher fever.  Watch closely for changes in your health, and be sure to contact your doctor if:  ?? You have new or worse facial pain.  ?? The mucus from your nose becomes thicker (like pus) or has new blood in it.  ?? You are not getting better as expected.  Where can you learn more?  Go to http://www.healthwise.net/GoodHelpConnections.  Enter I933 in the search box to learn more about "Sinusitis: Care Instructions."  Current as of: September 25, 2014  Content Version: 11.3  ?? 2006-2017 [employer]. Care instructions adapted under license by [employer] (which disclaims liability or warranty for this information). If you have questions about a medical condition or this instruction, always ask your healthcare professional. Healthwise, Incorporated disclaims any warranty or liability for your use of this information.

## 2015-11-23 NOTE — Telephone Encounter (Signed)
Pt call in would like a call in advised her chest was tight and she was short of breath. Transferred to nurse

## 2015-11-23 NOTE — Telephone Encounter (Signed)
Pt transferred due to symptoms of chest tightness and sob. Pt complains of cold and asthma symptoms. Chest feels very tight and she is tired as she cannot get a deep breath, no cough. Appt provided.

## 2015-11-26 ENCOUNTER — Encounter: Attending: Internal Medicine | Primary: Internal Medicine

## 2015-12-07 ENCOUNTER — Ambulatory Visit: Admit: 2015-12-07 | Payer: PRIVATE HEALTH INSURANCE | Attending: Internal Medicine | Primary: Internal Medicine

## 2015-12-07 DIAGNOSIS — K219 Gastro-esophageal reflux disease without esophagitis: Secondary | ICD-10-CM

## 2015-12-07 MED ORDER — PANTOPRAZOLE 40 MG TAB, DELAYED RELEASE
40 mg | ORAL_TABLET | Freq: Every day | ORAL | 1 refills | Status: DC
Start: 2015-12-07 — End: 2016-08-22

## 2015-12-07 NOTE — Progress Notes (Signed)
Vanessa Kramer is a 45 y.o. female who presents today for Heartburn  .      She has a history of   Patient Active Problem List   Diagnosis Code   ??? Dizziness R42   ??? HTN (hypertension) I10   ??? Vertigo R42   ??? Pharyngitis J02.9   ??? Asthma J45.909   .    Today patient is here for an acute visit.   she does not have other concerns.    Problem visit:  Vanessa Kramer is here for complaint of worsening heartburn.  Problem began 4 day(s) ago.  Severity is moderate  Character of problem: chronic heartburn, Nissen in 1998. Last scope in 2013 by Dr.   Eating and not eating makes the problem worse.  No help with zantac.   Associated symptoms include: no black stools. No vomiting.   Treatments tried include:       ROS  Review of Systems   Constitutional: Negative for chills, fever and weight loss.   HENT: Negative for ear pain, hearing loss and tinnitus.    Eyes: Negative for blurred vision, double vision, photophobia and pain.   Respiratory: Negative for cough, hemoptysis and shortness of breath.    Cardiovascular: Negative for chest pain, palpitations, orthopnea, claudication and leg swelling.   Gastrointestinal: Positive for abdominal pain and heartburn. Negative for blood in stool, constipation, diarrhea, nausea and vomiting.   Genitourinary: Negative for dysuria, frequency and urgency.   Musculoskeletal: Negative for myalgias and neck pain.   Skin: Negative for itching and rash.   Neurological: Negative.    Endo/Heme/Allergies: Does not bruise/bleed easily.   Psychiatric/Behavioral: Negative for depression. The patient is not nervous/anxious.        Visit Vitals   ??? BP 129/81 (BP 1 Location: Left arm, BP Patient Position: Sitting)   ??? Pulse 94   ??? Temp 98.1 ??F (36.7 ??C) (Oral)   ??? Resp 16   ??? Ht 5\' 6"  (1.676 m)   ??? Wt 144 lb (65.3 kg)   ??? SpO2 98%   ??? BMI 23.24 kg/m2       Physical Exam   Constitutional: She is oriented to person, place, and time. She appears well-developed and well-nourished.   HENT:    Head: Normocephalic and atraumatic.   Cardiovascular: Normal rate and regular rhythm.    No murmur heard.  Pulmonary/Chest: Effort normal and breath sounds normal. No respiratory distress.   Abdominal: Soft. Bowel sounds are normal.   Musculoskeletal: Normal range of motion. She exhibits no edema.   Neurological: She is alert and oriented to person, place, and time.   Skin: Skin is warm.   Psychiatric: She has a normal mood and affect. Her behavior is normal.         Current Outpatient Prescriptions   Medication Sig   ??? raNITIdine (ZANTAC) 150 mg tablet Take 150 mg by mouth two (2) times a day.   ??? pantoprazole (PROTONIX) 40 mg tablet Take 1 Tab by mouth daily.   ??? diphenhydrAMINE (BENADRYL) 25 mg capsule Take 25 mg by mouth every six (6) hours as needed.   ??? albuterol (PROVENTIL HFA, VENTOLIN HFA, PROAIR HFA) 90 mcg/actuation inhaler Take 1 Puff by inhalation every four (4) hours as needed for Wheezing.   ??? atenolol (TENORMIN) 50 mg tablet Take 0.5 Tabs by mouth daily.   ??? benazepril (LOTENSIN) 40 mg tablet Take 1 Tab by mouth daily.     No current facility-administered medications  for this visit.         Past Medical History:   Diagnosis Date   ??? Asthma    ??? Chronic kidney disease    ??? GERD (gastroesophageal reflux disease)    ??? Headache    ??? Hypertension    ??? Pap smear July 29, 2007      Past Surgical History:   Procedure Laterality Date   ??? HX GYN      D&C   ??? HX HERNIA REPAIR  1998   ??? HX TUBAL LIGATION  2004      Social History   Substance Use Topics   ??? Smoking status: Never Smoker   ??? Smokeless tobacco: Never Used   ??? Alcohol use 1.5 oz/week     3 Glasses of wine per week      Comment: occ      Family History   Problem Relation Age of Onset   ??? Hypertension Mother    ??? Hypertension Father    ??? Hypertension Brother    ??? Cancer Maternal Grandmother    ??? Cancer Paternal Aunt      pancreatic        Allergies   Allergen Reactions   ??? Amoxicillin Rash   ??? Compazine [Prochlorperazine Edisylate] Other (comments)      Lock muscle   ??? Dilaudid [Hydromorphone (Pf)] Itching   ??? Percocet [Oxycodone-Acetaminophen] Itching   ??? Prednisone Swelling     Throat swelling        Assessment/Plan  Diagnoses and all orders for this visit:    1. Gastroesophageal reflux disease, esophagitis presence not specified - worse recently. May be 2/2 abx. PPI for 4-6 weeks. Pt to do GERD precautions. Test stoll for H. Pylori.   -     H PYLORI AG, STOOL  -     pantoprazole (PROTONIX) 40 mg tablet; Take 1 Tab by mouth daily.    2. Status post Nissen fundoplication - will need repeat EGD>   -     H PYLORI AG, STOOL  -     pantoprazole (PROTONIX) 40 mg tablet; Take 1 Tab by mouth daily.        Follow-up Disposition:  Return in about 1 month (around 01/07/2016).    [name], MD  12/07/2015

## 2015-12-07 NOTE — Telephone Encounter (Signed)
Patient has had Heart burn for couple of days and has taken over counter medication for it and is not helping. Has history of gerd. Her no is [phone]  Would like to speak with nurse

## 2015-12-07 NOTE — Patient Instructions (Signed)
Gastroesophageal Reflux Disease (GERD): Care Instructions  Your Care Instructions    Gastroesophageal reflux disease (GERD) is the backward flow of stomach acid into the esophagus. The esophagus is the tube that leads from your throat to your stomach. A one-way valve prevents the stomach acid from moving up into this tube. When you have GERD, this valve does not close tightly enough.  If you have mild GERD symptoms including heartburn, you may be able to control the problem with antacids or over-the-counter medicine. Changing your diet, losing weight, and making other lifestyle changes can also help reduce symptoms.  Follow-up care is a key part of your treatment and safety. Be sure to make and go to all appointments, and call your doctor if you are having problems. It???s also a good idea to know your test results and keep a list of the medicines you take.  How can you care for yourself at home?  ?? Take your medicines exactly as prescribed. Call your doctor if you think you are having a problem with your medicine.  ?? Your doctor may recommend over-the-counter medicine. For mild or occasional indigestion, antacids, such as Tums, Gaviscon, Mylanta, or Maalox, may help. Your doctor also may recommend over-the-counter acid reducers, such as Pepcid AC, Tagamet HB, Zantac 75, or Prilosec. Read and follow all instructions on the label. If you use these medicines often, talk with your doctor.  ?? Change your eating habits.  ?? It???s best to eat several small meals instead of two or three large meals.  ?? After you eat, wait 2 to 3 hours before you lie down.  ?? Chocolate, mint, and alcohol can make GERD worse.  ?? Spicy foods, foods that have a lot of acid (like tomatoes and oranges), and coffee can make GERD symptoms worse in some people. If your symptoms are worse after you eat a certain food, you may want to stop eating that food to see if your symptoms get better.   ?? Do not smoke or chew tobacco. Smoking can make GERD worse. If you need help quitting, talk to your doctor about stop-smoking programs and medicines. These can increase your chances of quitting for good.  ?? If you have GERD symptoms at night, raise the head of your bed 6 to 8 inches by putting the frame on blocks or placing a foam wedge under the head of your mattress. (Adding extra pillows does not work.)  ?? Do not wear tight clothing around your middle.  ?? Lose weight if you need to. Losing just 5 to 10 pounds can help.  When should you call for help?  Call your doctor now or seek immediate medical care if:  ?? You have new or different belly pain.  ?? Your stools are black and tarlike or have streaks of blood.  Watch closely for changes in your health, and be sure to contact your doctor if:  ?? Your symptoms have not improved after 2 days.  ?? Food seems to catch in your throat or chest.  Where can you learn more?  Go to http://www.healthwise.net/GoodHelpConnections.  Enter T927 in the search box to learn more about "Gastroesophageal Reflux Disease (GERD): Care Instructions."  Current as of: October 06, 2014  Content Version: 11.3  ?? 2006-2017 [employer]. Care instructions adapted under license by [employer] (which disclaims liability or warranty for this information). If you have questions about a medical condition or this instruction, always ask your healthcare professional. [employer] disclaims any warranty   or liability for your use of this information.

## 2015-12-07 NOTE — Telephone Encounter (Signed)
Attempted to R/C to pt; phone # in message does not belong to Pt. LVM at [phone] for Pt to R/C to office and schedule appt to be evaluated for s/s.

## 2015-12-15 LAB — H PYLORI AG, STOOL: H. pylori Ag, stool, EIA: NEGATIVE

## 2015-12-15 NOTE — Progress Notes (Signed)
Letter sent.

## 2016-02-07 ENCOUNTER — Encounter

## 2016-02-07 MED ORDER — BENAZEPRIL 40 MG TAB
40 mg | ORAL_TABLET | Freq: Every day | ORAL | 5 refills | Status: DC
Start: 2016-02-07 — End: 2016-06-19

## 2016-02-07 MED ORDER — ATENOLOL 50 MG TAB
50 mg | ORAL_TABLET | Freq: Every day | ORAL | 2 refills | Status: DC
Start: 2016-02-07 — End: 2016-06-04

## 2016-02-07 NOTE — Telephone Encounter (Signed)
From: Vanessa Kramer  To: Cordella RegisterJeremy Ravussin, MD  Sent: 02/07/2016 10:36 AM EST  Subject:  Medication Renewal Request    Original  authorizing provider: Cordella RegisterJeremy Ravussin, MD    Vanessa Kramer would like a refill of the following medications:  benazepril  (LOTENSIN) 40 mg tablet [Jeremy Ravussin, MD]  atenolol  (TENORMIN) 50 mg tablet [name][Jeremy Ravussin, MD]    Preferred  pharmacy: [facility] - [place], [place] - [address]    Comment:

## 2016-03-30 HISTORY — PX: URETHRAL DIVERTICULECTOMY: SHX2618

## 2016-06-04 MED ORDER — ATENOLOL 50 MG TAB
50 mg | ORAL_TABLET | ORAL | 2 refills | Status: DC
Start: 2016-06-04 — End: 2016-09-03

## 2016-06-16 NOTE — Telephone Encounter (Signed)
From: Cephus Shelling  To: [name], MD  Sent: 06/15/2016 2:31 PM EDT  Subject:  Non-Urgent Medical Question    My  feet  legs have been sweeling for the pass 2wks. I have been taking my blood pressure medication daily Im not sure whybthey are swelling  my salt intake is not high at all. I currently deive everyday to [place] for my new job. If you need me to come in i cant sue to the out of town job. Please let me know what I need to do it is starting to become a little painful.

## 2016-06-16 NOTE — Telephone Encounter (Signed)
Called Pt and advised appt needed. Pt reports swelling in bil feet and legs, denies any other s/s. Pt states she has difficulty scheduling appt d/t having to drive to [place] daily. Recommended going to urgent care or seeing a provider in [place].   Appt has been scheduled for 06/19/16.

## 2016-06-19 ENCOUNTER — Ambulatory Visit
Admit: 2016-06-19 | Discharge: 2016-06-19 | Payer: PRIVATE HEALTH INSURANCE | Attending: Internal Medicine | Primary: Internal Medicine

## 2016-06-19 DIAGNOSIS — I10 Essential (primary) hypertension: Secondary | ICD-10-CM

## 2016-06-19 MED ORDER — LOSARTAN-HYDROCHLOROTHIAZIDE 100 MG-12.5 MG TAB
ORAL_TABLET | Freq: Every day | ORAL | 3 refills | Status: DC
Start: 2016-06-19 — End: 2016-08-22

## 2016-06-19 NOTE — Patient Instructions (Signed)
Leg and Ankle Edema: Care Instructions  Your Care Instructions  Swelling in the legs, ankles, and feet is called edema. It is common after you sit or stand for a while. Long plane flights or car rides often cause swelling in the legs and feet. You may also have swelling if you have to stand for long periods of time at your job. Problems with the veins in the legs (varicose veins) and changes in hormones can also cause swelling. Sometimes the swelling in the ankles and feet is caused by a more serious problem, such as heart failure, infection, blood clots, or liver or kidney disease.  Follow-up care is a key part of your treatment and safety. Be sure to make and go to all appointments, and call your doctor if you are having problems. It's also a good idea to know your test results and keep a list of the medicines you take.  How can you care for yourself at home?  ?? If your doctor gave you medicine, take it as prescribed. Call your doctor if you think you are having a problem with your medicine.  ?? Whenever you are resting, raise your legs up. Try to keep the swollen area higher than the level of your heart.  ?? Take breaks from standing or sitting in one position.  ?? Walk around to increase the blood flow in your lower legs.  ?? Move your feet and ankles often while you stand, or tighten and relax your leg muscles.  ?? Wear support stockings. Put them on in the morning, before swelling gets worse.  ?? Eat a balanced diet. Lose weight if you need to.  ?? Limit the amount of salt (sodium) in your diet. Salt holds fluid in the body and may increase swelling.  When should you call for help?  Call 911 anytime you think you may need emergency care. For example, call if:  ? ?? You have symptoms of a blood clot in your lung (called a pulmonary embolism). These may include:  ?? Sudden chest pain.  ?? Trouble breathing.  ?? Coughing up blood.   ?Call your doctor now or seek immediate medical care if:   ? ?? You have signs of a blood clot, such as:  ?? Pain in your calf, back of the knee, thigh, or groin.  ?? Redness and swelling in your leg or groin.   ? ?? You have symptoms of infection, such as:  ?? Increased pain, swelling, warmth, or redness.  ?? Red streaks or pus.  ?? A fever.   ?Watch closely for changes in your health, and be sure to contact your doctor if:  ? ?? Your swelling is getting worse.   ? ?? You have new or worsening pain in your legs.   ? ?? You do not get better as expected.   Where can you learn more?  Go to http://www.healthwise.net/[facility].  Enter N696 in the search box to learn more about "Leg and Ankle Edema: Care Instructions."  Current as of: May 17, 2015  Content Version: 11.4  ?? 2006-2017 [employer]. Care instructions adapted under license by [employer] (which disclaims liability or warranty for this information). If you have questions about a medical condition or this instruction, always ask your healthcare professional. [employer] disclaims any warranty or liability for your use of this information.

## 2016-06-19 NOTE — Progress Notes (Signed)
Vanessa Kramer is a 46 y.o. female who presents today for Hypertension and Swelling  .      She has a history of   Patient Active Problem List   Diagnosis Code   ??? Dizziness R42   ??? HTN (hypertension) I10   ??? Vertigo R42   ??? Pharyngitis J02.9   ??? Asthma J45.909   .    Today patient is here for an acute visit.    Problem visit:  Vanessa Kramer is here for complaint of bilateral leg edema.  Problem began 2 week(s) ago.  Severity is moderate  Character of problem: Edema to mid ankles.   Swelling makes the problem worse.  Once she starts walking slowly improves.   Now driving to [place] daily which started 2 weeks ago.   No CP or SOB.   Todays weight is up 10# since our visit in November.     Hypertension - not checking  Hypertension ROS: taking medications as instructed, no medication side effects noted, no TIA's, no chest pain on exertion, no dyspnea on exertion, no swelling of ankles     reports that she has never smoked. She has never used smokeless tobacco.    reports that she drinks about 1.5 oz of alcohol per week    BP Readings from Last 2 Encounters:   06/19/16 (!) 175/100   12/07/15 129/81         ROS  Review of Systems   Constitutional: Negative for chills, fever and weight loss.   Respiratory: Negative for cough and shortness of breath.    Cardiovascular: Positive for leg swelling. Negative for chest pain and palpitations.   Gastrointestinal: Negative for abdominal pain, nausea and vomiting.   Neurological: Negative.    Endo/Heme/Allergies: Does not bruise/bleed easily.   Psychiatric/Behavioral: Negative for depression. The patient is not nervous/anxious.        Visit Vitals   ??? BP (!) 175/100 (BP 1 Location: Left arm, BP Patient Position: Sitting)   ??? Pulse 88   ??? Temp 98 ??F (36.7 ??C) (Oral)   ??? Resp 16   ??? Ht  (1.676 m)   ??? Wt 154 lb (69.9 kg)   ??? SpO2 98%   ??? BMI 24.86 kg/m2       Physical Exam   Constitutional: She is oriented to person, place, and time. She appears well-developed and well-nourished.    HENT:   Head: Normocephalic and atraumatic.   Cardiovascular: Normal rate and regular rhythm.    No murmur heard.  Pulmonary/Chest: Effort normal. No respiratory distress.   Neurological: She is alert and oriented to person, place, and time.   Skin: Skin is warm and dry.   Psychiatric: She has a normal mood and affect. Her behavior is normal.         Current Outpatient Prescriptions   Medication Sig   ??? losartan-hydroCHLOROthiazide (HYZAAR) 100-12.5 mg per tablet Take 1 Tab by mouth daily.   ??? atenolol (TENORMIN) 50 mg tablet TAKE ONE-HALF TABLET BY MOUTH ONCE DAILY   ??? raNITIdine (ZANTAC) 150 mg tablet Take 150 mg by mouth two (2) times a day.   ??? pantoprazole (PROTONIX) 40 mg tablet Take 1 Tab by mouth daily.   ??? diphenhydrAMINE (BENADRYL) 25 mg capsule Take 25 mg by mouth every six (6) hours as needed.   ??? albuterol (PROVENTIL HFA, VENTOLIN HFA, PROAIR HFA) 90 mcg/actuation inhaler Take 1 Puff by inhalation every four (4) hours as needed for  Wheezing.     No current facility-administered medications for this visit.         Past Medical History:   Diagnosis Date   ??? Asthma    ??? Chronic kidney disease    ??? GERD (gastroesophageal reflux disease)    ??? Headache    ??? Hypertension    ??? Pap smear July 29, 2007      Past Surgical History:   Procedure Laterality Date   ??? HX GYN      D&C   ??? HX HERNIA REPAIR  1998   ??? HX TUBAL LIGATION  2004      Social History   Substance Use Topics   ??? Smoking status: Never Smoker   ??? Smokeless tobacco: Never Used   ??? Alcohol use 1.5 oz/week     3 Glasses of wine per week      Comment: occ      Family History   Problem Relation Age of Onset   ??? Hypertension Mother    ??? Hypertension Father    ??? Hypertension Brother    ??? Cancer Maternal Grandmother    ??? Cancer Paternal Aunt      pancreatic        Allergies   Allergen Reactions   ??? Amoxicillin Rash   ??? Compazine [Prochlorperazine Edisylate] Other (comments)     Lock muscle   ??? Dilaudid [Hydromorphone (Pf)] Itching    ??? Percocet [Oxycodone-Acetaminophen] Itching   ??? Prednisone Swelling     Throat swelling        Assessment/Plan  Diagnoses and all orders for this visit:    1. Essential hypertension - Weight up. Check at home. Add HCTZ.   -     losartan-hydroCHLOROthiazide (HYZAAR) 100-12.5 mg per tablet; Take 1 Tab by mouth daily.    2. Edema, unspecified type - likely due to increased driving. Support hose.         Follow-up Disposition:  Return in about 4 weeks (around 07/17/2016) for BP check.    [name], MD  06/19/2016

## 2016-06-19 NOTE — Progress Notes (Deleted)
Vanessa Kramer is a 46 y.o. female who presents today for Hypertension and Swelling  .      She has a history of   Patient Active Problem List   Diagnosis Code   ??? Dizziness R42   ??? HTN (hypertension) I10   ??? Vertigo R42   ??? Pharyngitis J02.9   ??? Asthma J45.909   .    Today patient ***. she {DOES_DOES ZOX:09604} have other concerns.    ROS  ROS    Visit Vitals   ??? BP (!) 175/100 (BP 1 Location: Left arm, BP Patient Position: Sitting)   ??? Pulse 88   ??? Temp 98 ??F (36.7 ??C) (Oral)   ??? Resp 16   ??? Ht  (1.676 m)   ??? Wt 154 lb (69.9 kg)   ??? SpO2 98%   ??? BMI 24.86 kg/m2       Physical Exam      Current Outpatient Prescriptions   Medication Sig   ??? atenolol (TENORMIN) 50 mg tablet TAKE ONE-HALF TABLET BY MOUTH ONCE DAILY   ??? benazepril (LOTENSIN) 40 mg tablet Take 1 Tab by mouth daily.   ??? raNITIdine (ZANTAC) 150 mg tablet Take 150 mg by mouth two (2) times a day.   ??? pantoprazole (PROTONIX) 40 mg tablet Take 1 Tab by mouth daily.   ??? diphenhydrAMINE (BENADRYL) 25 mg capsule Take 25 mg by mouth every six (6) hours as needed.   ??? albuterol (PROVENTIL HFA, VENTOLIN HFA, PROAIR HFA) 90 mcg/actuation inhaler Take 1 Puff by inhalation every four (4) hours as needed for Wheezing.     No current facility-administered medications for this visit.         Past Medical History:   Diagnosis Date   ??? Asthma    ??? Chronic kidney disease    ??? GERD (gastroesophageal reflux disease)    ??? Headache    ??? Hypertension    ??? Pap smear July 29, 2007      Past Surgical History:   Procedure Laterality Date   ??? HX GYN      D&C   ??? HX HERNIA REPAIR  1998   ??? HX TUBAL LIGATION  2004      Social History   Substance Use Topics   ??? Smoking status: Never Smoker   ??? Smokeless tobacco: Never Used   ??? Alcohol use 1.5 oz/week     3 Glasses of wine per week      Comment: occ      Family History   Problem Relation Age of Onset   ??? Hypertension Mother    ??? Hypertension Father    ??? Hypertension Brother    ??? Cancer Maternal Grandmother     ??? Cancer Paternal Aunt      pancreatic        Allergies   Allergen Reactions   ??? Amoxicillin Rash   ??? Compazine [Prochlorperazine Edisylate] Other (comments)     Lock muscle   ??? Dilaudid [Hydromorphone (Pf)] Itching   ??? Percocet [Oxycodone-Acetaminophen] Itching   ??? Prednisone Swelling     Throat swelling        Assessment/Plan  {Assessment and Plan Chronic Disease:19072}    Follow-up Disposition: Not on File    [name], MD  06/19/2016

## 2016-07-19 ENCOUNTER — Encounter: Attending: Internal Medicine | Primary: Internal Medicine

## 2016-08-21 ENCOUNTER — Encounter: Attending: Internal Medicine | Primary: Internal Medicine

## 2016-08-22 ENCOUNTER — Ambulatory Visit
Admit: 2016-08-22 | Discharge: 2016-08-22 | Payer: PRIVATE HEALTH INSURANCE | Attending: Internal Medicine | Primary: Internal Medicine

## 2016-08-22 DIAGNOSIS — I10 Essential (primary) hypertension: Secondary | ICD-10-CM

## 2016-08-22 MED ORDER — RANITIDINE 150 MG TAB
150 mg | ORAL_TABLET | Freq: Two times a day (BID) | ORAL | 1 refills | Status: AC | PRN
Start: 2016-08-22 — End: ?

## 2016-08-22 MED ORDER — LOSARTAN-HYDROCHLOROTHIAZIDE 100 MG-25 MG TAB
100-25 mg | ORAL_TABLET | Freq: Every day | ORAL | 5 refills | Status: AC
Start: 2016-08-22 — End: ?

## 2016-08-22 MED ORDER — PANTOPRAZOLE 40 MG TAB, DELAYED RELEASE
40 mg | ORAL_TABLET | Freq: Every day | ORAL | 5 refills | Status: DC
Start: 2016-08-22 — End: 2017-03-16

## 2016-08-22 NOTE — Progress Notes (Signed)
Vanessa Kramer is a 46 y.o. female who presents today for Hypertension and Dry Skin  .      She has a history of   Patient Active Problem List   Diagnosis Code   ??? Dizziness R42   ??? HTN (hypertension) I10   ??? Vertigo R42   ??? Pharyngitis J02.9   ??? Asthma J45.909   .    Today patient is here for f/u.   she does have other concerns.    Rash: to back of neck for last couple days. Itching and raised bumps. Better with hydrocortisone.     Edema/HTN: both a bit worse. Had been driving more. Added diuretic to BP meds to help. Since notes that BP has been not checking.    Mild fatigue with medication, but very tolerable.     ROS  Review of Systems   Constitutional: Negative for chills, fever and weight loss.   Respiratory: Negative for cough and shortness of breath.    Cardiovascular: Positive for leg swelling (better). Negative for chest pain and palpitations.   Gastrointestinal: Negative for abdominal pain, nausea and vomiting.   Genitourinary: Negative for dysuria, frequency and urgency.   Skin: Positive for rash.   Neurological: Negative.    Endo/Heme/Allergies: Does not bruise/bleed easily.   Psychiatric/Behavioral: Negative for depression. The patient is not nervous/anxious.        Visit Vitals   ??? BP (!) 133/95 (BP 1 Location: Left arm, BP Patient Position: Sitting)   ??? Pulse 74   ??? Temp 98.5 ??F (36.9 ??C) (Oral)   ??? Resp 16   ??? Ht 5\' 6"  (1.676 m)   ??? Wt 162 lb (73.5 kg)   ??? SpO2 99%   ??? BMI 26.15 kg/m2       Physical Exam   Constitutional: She is oriented to person, place, and time. She appears well-developed and well-nourished.   HENT:   Head: Normocephalic and atraumatic.   Cardiovascular: Normal rate and regular rhythm.    No murmur heard.  Pulmonary/Chest: Effort normal. No respiratory distress.   Musculoskeletal: She exhibits no edema.   Neurological: She is alert and oriented to person, place, and time.   Skin: Skin is warm and dry.        Small erythematous raised lesions.     Psychiatric: She has a normal mood and affect. Her behavior is normal.         Current Outpatient Prescriptions   Medication Sig   ??? pantoprazole (PROTONIX) 40 mg tablet Take 1 Tab by mouth daily.   ??? losartan-hydroCHLOROthiazide (HYZAAR) 100-25 mg per tablet Take 1 Tab by mouth daily.   ??? raNITIdine (ZANTAC) 150 mg tablet Take 1 Tab by mouth two (2) times daily as needed for Indigestion.   ??? atenolol (TENORMIN) 50 mg tablet TAKE ONE-HALF TABLET BY MOUTH ONCE DAILY   ??? diphenhydrAMINE (BENADRYL) 25 mg capsule Take 25 mg by mouth every six (6) hours as needed.   ??? albuterol (PROVENTIL HFA, VENTOLIN HFA, PROAIR HFA) 90 mcg/actuation inhaler Take 1 Puff by inhalation every four (4) hours as needed for Wheezing.     No current facility-administered medications for this visit.         Past Medical History:   Diagnosis Date   ??? Asthma    ??? Chronic kidney disease    ??? GERD (gastroesophageal reflux disease)    ??? Headache    ??? Hypertension    ??? Pap smear July 29, 2007  Past Surgical History:   Procedure Laterality Date   ??? HX GYN      D&C   ??? HX HERNIA REPAIR  1998   ??? HX TUBAL LIGATION  2004      Social History   Substance Use Topics   ??? Smoking status: Never Smoker   ??? Smokeless tobacco: Never Used   ??? Alcohol use 1.5 oz/week     3 Glasses of wine per week      Comment: occ      Family History   Problem Relation Age of Onset   ??? Hypertension Mother    ??? Hypertension Father    ??? Hypertension Brother    ??? Cancer Maternal Grandmother    ??? Cancer Paternal Aunt      pancreatic        Allergies   Allergen Reactions   ??? Amoxicillin Rash   ??? Compazine [Prochlorperazine Edisylate] Other (comments)     Lock muscle   ??? Dilaudid [Hydromorphone (Pf)] Itching   ??? Percocet [Oxycodone-Acetaminophen] Itching   ??? Prednisone Swelling     Throat swelling        Assessment/Plan  Diagnoses and all orders for this visit:    1. Essential hypertension - just above goal. Given Rx for BP machine. Check at home. Goals discussed.    -     losartan-hydroCHLOROthiazide (HYZAAR) 100-25 mg per tablet; Take 1 Tab by mouth daily.    2. Gastroesophageal reflux disease, esophagitis presence not specified -stable.   -     pantoprazole (PROTONIX) 40 mg tablet; Take 1 Tab by mouth daily.  -     raNITIdine (ZANTAC) 150 mg tablet; Take 1 Tab by mouth two (2) times daily as needed for Indigestion.    3. Status post Nissen fundoplication  -     pantoprazole (PROTONIX) 40 mg tablet; Take 1 Tab by mouth daily.  -     raNITIdine (ZANTAC) 150 mg tablet; Take 1 Tab by mouth two (2) times daily as needed for Indigestion.    4. Edema, unspecified type - improved. Elevate. Should improve with 25mg . Pt to see me in 3 mos for physical and testing.   -     losartan-hydroCHLOROthiazide (HYZAAR) 100-25 mg per tablet; Take 1 Tab by mouth daily.    5. Dermatitis - improving. Continue topical steroid.         Follow-up Disposition:  Return in about 3 months (around 11/22/2016) for Physical .    Cordella RegisterJeremy Zanyla Klebba, MD  08/22/2016

## 2016-08-22 NOTE — Patient Instructions (Signed)
High Blood Pressure: Care Instructions  Your Care Instructions    If your blood pressure is usually above 140/90, you have high blood pressure, or hypertension. That means the top number is 140 or higher or the bottom number is 90 or higher, or both.  Despite what a lot of people think, high blood pressure usually doesn't cause headaches or make you feel dizzy or lightheaded. It usually has no symptoms. But it does increase your risk for heart attack, stroke, and kidney or eye damage. The higher your blood pressure, the more your risk increases.  Your doctor will give you a goal for your blood pressure. Your goal will be based on your health and your age. An example of a goal is to keep your blood pressure below 140/90.  Lifestyle changes, such as eating healthy and being active, are always important to help lower blood pressure. You might also take medicine to reach your blood pressure goal.  Follow-up care is a key part of your treatment and safety. Be sure to make and go to all appointments, and call your doctor if you are having problems. It's also a good idea to know your test results and keep a list of the medicines you take.  How can you care for yourself at home?  Medical treatment  ?? If you stop taking your medicine, your blood pressure will go back up. You may take one or more types of medicine to lower your blood pressure. Be safe with medicines. Take your medicine exactly as prescribed. Call your doctor if you think you are having a problem with your medicine.  ?? Talk to your doctor before you start taking aspirin every day. Aspirin can help certain people lower their risk of a heart attack or stroke. But taking aspirin isn't right for everyone, because it can cause serious bleeding.  ?? See your doctor regularly. You may need to see the doctor more often at first or until your blood pressure comes down.  ?? If you are taking blood pressure medicine, talk to your doctor before  you take decongestants or anti-inflammatory medicine, such as ibuprofen. Some of these medicines can raise blood pressure.  ?? Learn how to check your blood pressure at home.  Lifestyle changes  ?? Stay at a healthy weight. This is especially important if you put on weight around the waist. Losing even 10 pounds can help you lower your blood pressure.  ?? If your doctor recommends it, get more exercise. Walking is a good choice. Bit by bit, increase the amount you walk every day. Try for at least 30 minutes on most days of the week. You also may want to swim, bike, or do other activities.  ?? Avoid or limit alcohol. Talk to your doctor about whether you can drink any alcohol.  ?? Try to limit how much sodium you eat to less than 2,300 milligrams (mg) a day. Your doctor may ask you to try to eat less than 1,500 mg a day.  ?? Eat plenty of fruits (such as bananas and oranges), vegetables, legumes, whole grains, and low-fat dairy products.  ?? Lower the amount of saturated fat in your diet. Saturated fat is found in animal products such as milk, cheese, and meat. Limiting these foods may help you lose weight and also lower your risk for heart disease.  ?? Do not smoke. Smoking increases your risk for heart attack and stroke. If you need help quitting, talk to your doctor about stop-smoking programs   and medicines. These can increase your chances of quitting for good.  When should you call for help?  Call 911 anytime you think you may need emergency care. This may mean having symptoms that suggest that your blood pressure is causing a serious heart or blood vessel problem. Your blood pressure may be over 180/110.  ?For example, call 911 if:  ? ?? You have symptoms of a heart attack. These may include:  ?? Chest pain or pressure, or a strange feeling in the chest.  ?? Sweating.  ?? Shortness of breath.  ?? Nausea or vomiting.  ?? Pain, pressure, or a strange feeling in the back, neck, jaw, or upper  belly or in one or both shoulders or arms.  ?? Lightheadedness or sudden weakness.  ?? A fast or irregular heartbeat.   ? ?? You have symptoms of a stroke. These may include:  ?? Sudden numbness, tingling, weakness, or loss of movement in your face, arm, or leg, especially on only one side of your body.  ?? Sudden vision changes.  ?? Sudden trouble speaking.  ?? Sudden confusion or trouble understanding simple statements.  ?? Sudden problems with walking or balance.  ?? A sudden, severe headache that is different from past headaches.   ? ?? You have severe back or belly pain.   ?Do not wait until your blood pressure comes down on its own. Get help right away.  ?Call your doctor now or seek immediate care if:  ? ?? Your blood pressure is much higher than normal (such as 180/110 or higher), but you don't have symptoms.   ? ?? You think high blood pressure is causing symptoms, such as:  ?? Severe headache.  ?? Blurry vision.   ?Watch closely for changes in your health, and be sure to contact your doctor if:  ? ?? Your blood pressure measures 140/90 or higher at least 2 times. That means the top number is 140 or higher or the bottom number is 90 or higher, or both.   ? ?? You think you may be having side effects from your blood pressure medicine.   ? ?? Your blood pressure is usually normal, but it goes above normal at least 2 times.   Where can you learn more?  Go to http://www.healthwise.net/GoodHelpConnections.  Enter X567 in the search box to learn more about "High Blood Pressure: Care Instructions."  Current as of: November 18, 2014  Content Version: 11.4  ?? 2006-2017 [employer]. Care instructions adapted under license by [employer] (which disclaims liability or warranty for this information). If you have questions about a medical condition or this instruction, always ask your healthcare professional. [employer]wise, Inc[employer] disclaims any warranty or liability for your use of this information.        Learning About High Blood Pressure  What is high blood pressure?    Blood pressure is a measure of how hard the blood pushes against the walls of your arteries. It's normal for blood pressure to go up and down throughout the day, but if it stays up, you have high blood pressure. Another name for high blood pressure is hypertension.  Two numbers tell you your blood pressure. The first number is the systolic pressure. It shows how hard the blood pushes when your heart is pumping. The second number is the diastolic pressure. It shows how hard the blood pushes between heartbeats, when your heart is relaxed and filling with blood.  A blood pressure of less than   120/80 (say "120 over 80") is ideal for an adult. High blood pressure is 140/90 or higher. You have high blood pressure if your top number is 140 or higher or your bottom number is 90 or higher, or both. Many people fall into the category in between, called prehypertension. People with prehypertension need to make lifestyle changes to bring their blood pressure down and help prevent or delay high blood pressure.  What happens when you have high blood pressure?  ?? Blood flows through your arteries with too much force. Over time, this damages the walls of your arteries. But you can't feel it. High blood pressure usually doesn't cause symptoms.  ?? Fat and calcium start to build up in your arteries. This buildup is called plaque. Plaque makes your arteries narrower and stiffer. Blood can't flow through them as easily.  ?? This lack of good blood flow starts to damage some of the organs in your body. This can lead to problems such as coronary artery disease and heart attack, heart failure, stroke, kidney failure, and eye damage.  How can you prevent high blood pressure?  ?? Stay at a healthy weight.  ?? Try to limit how much sodium you eat to less than 2,300 milligrams (mg) a day. If you limit your sodium to 1,500 mg a day, you can lower your blood pressure even more.   ?? Buy foods that are labeled "unsalted," "sodium-free," or "low-sodium." Foods labeled "reduced-sodium" and "light sodium" may still have too much sodium.  ?? Flavor your food with garlic, lemon juice, onion, vinegar, herbs, and spices instead of salt. Do not use soy sauce, steak sauce, onion salt, garlic salt, mustard, or ketchup on your food.  ?? Use less salt (or none) when recipes call for it. You can often use half the salt a recipe calls for without losing flavor.  ?? Be physically active. Get at least 30 minutes of exercise on most days of the week. Walking is a good choice. You also may want to do other activities, such as running, swimming, cycling, or playing tennis or team sports.  ?? Limit alcohol to 2 drinks a day for men and 1 drink a day for women.  ?? Eat plenty of fruits, vegetables, and low-fat dairy products. Eat less saturated and total fats.  How is high blood pressure treated?  ?? Your doctor will suggest making lifestyle changes. For example, your doctor may ask you to eat healthy foods, quit smoking, lose extra weight, and be more active.  ?? If lifestyle changes don't help enough or your blood pressure is very high, you will have to take medicine every day.  Follow-up care is a key part of your treatment and safety. Be sure to make and go to all appointments, and call your doctor if you are having problems. It's also a good idea to know your test results and keep a list of the medicines you take.  Where can you learn more?  Go to http://www.healthwise.net/GoodHelpConnections.  Enter P501 in the search box to learn more about "Learning About High Blood Pressure."  Current as of: November 18, 2014  Content Version: 11.4  ?? 2006-2017 [employer]. Care instructions adapted under license by [employer] (which disclaims liability or warranty for this information). If you have questions about a medical condition or  this instruction, always ask your healthcare professional. [employer] disclaims any warranty or liability for your use of this information.

## 2016-09-04 MED ORDER — ATENOLOL 50 MG TAB
50 mg | ORAL_TABLET | ORAL | 6 refills | Status: AC
Start: 2016-09-04 — End: ?

## 2017-01-24 DIAGNOSIS — J45998 Other asthma: Secondary | ICD-10-CM | POA: Diagnosis not present

## 2017-02-02 ENCOUNTER — Emergency Department (HOSPITAL_COMMUNITY)
Admission: EM | Admit: 2017-02-02 | Discharge: 2017-02-02 | Disposition: A | Discharge status: Home / Self Care | Payer: BLUE CROSS/BLUE SHIELD | Attending: Emergency Medicine | Admitting: Emergency Medicine

## 2017-02-02 ENCOUNTER — Encounter (HOSPITAL_COMMUNITY): Payer: Self-pay

## 2017-02-02 ENCOUNTER — Emergency Department (HOSPITAL_COMMUNITY): Payer: BLUE CROSS/BLUE SHIELD

## 2017-02-02 ENCOUNTER — Other Ambulatory Visit: Payer: Self-pay

## 2017-02-02 DIAGNOSIS — J45909 Unspecified asthma, uncomplicated: Secondary | ICD-10-CM | POA: Insufficient documentation

## 2017-02-02 DIAGNOSIS — Z79899 Other long term (current) drug therapy: Secondary | ICD-10-CM | POA: Insufficient documentation

## 2017-02-02 DIAGNOSIS — R079 Chest pain, unspecified: Secondary | ICD-10-CM | POA: Diagnosis not present

## 2017-02-02 DIAGNOSIS — R9431 Abnormal electrocardiogram [ECG] [EKG]: Secondary | ICD-10-CM | POA: Diagnosis not present

## 2017-02-02 DIAGNOSIS — R51 Headache: Secondary | ICD-10-CM | POA: Diagnosis not present

## 2017-02-02 DIAGNOSIS — I10 Essential (primary) hypertension: Secondary | ICD-10-CM | POA: Insufficient documentation

## 2017-02-02 DIAGNOSIS — R519 Headache, unspecified: Secondary | ICD-10-CM

## 2017-02-02 HISTORY — DX: Essential (primary) hypertension: I10

## 2017-02-02 HISTORY — DX: Unspecified asthma, uncomplicated: J45.909

## 2017-02-02 HISTORY — DX: Gastro-esophageal reflux disease without esophagitis: K21.9

## 2017-02-02 LAB — BASIC METABOLIC PANEL
ANION GAP: 9 (ref 5–15)
BUN: 15 mg/dL (ref 6–20)
CALCIUM: 9.1 mg/dL (ref 8.9–10.3)
CO2: 23 mmol/L (ref 22–32)
CREATININE: 1.03 mg/dL — AB (ref 0.44–1.00)
Chloride: 103 mmol/L (ref 101–111)
Glucose, Bld: 119 mg/dL — ABNORMAL HIGH (ref 65–99)
Potassium: 4.1 mmol/L (ref 3.5–5.1)
SODIUM: 135 mmol/L (ref 135–145)

## 2017-02-02 LAB — CBC
HCT: 32.6 % — ABNORMAL LOW (ref 36.0–46.0)
HEMOGLOBIN: 10.4 g/dL — AB (ref 12.0–15.0)
MCH: 26.5 pg (ref 26.0–34.0)
MCHC: 31.9 g/dL (ref 30.0–36.0)
MCV: 83 fL (ref 78.0–100.0)
PLATELETS: 268 10*3/uL (ref 150–400)
RBC: 3.93 MIL/uL (ref 3.87–5.11)
RDW: 14.6 % (ref 11.5–15.5)
WBC: 6 10*3/uL (ref 4.0–10.5)

## 2017-02-02 LAB — I-STAT TROPONIN, ED: TROPONIN I, POC: 0 ng/mL (ref 0.00–0.08)

## 2017-02-02 LAB — I-STAT BETA HCG BLOOD, ED (MC, WL, AP ONLY): I-stat hCG, quantitative: <5 m[IU]/mL (ref <5)

## 2017-02-02 MED ORDER — DIPHENHYDRAMINE HCL 25 MG PO CAPS
25.0000 mg | ORAL_CAPSULE | Freq: Once | ORAL | Status: AC
  Administered 2017-02-02: 25 mg via ORAL
  Filled 2017-02-02: qty 1

## 2017-02-02 MED ORDER — CHLORTHALIDONE 50 MG PO TABS: 50.0000 mg | ORAL_TABLET | Freq: Every day | ORAL | 1 refills | Status: DC

## 2017-02-02 MED ORDER — CHLORTHALIDONE 50 MG PO TABS
50.0000 mg | ORAL_TABLET | Freq: Every day | ORAL | Status: DC
  Filled 2017-02-02 (×2): qty 1

## 2017-02-02 MED ORDER — LISINOPRIL 10 MG PO TABS: 10.0000 mg | ORAL_TABLET | Freq: Every day | ORAL | 1 refills | Status: DC

## 2017-02-02 MED ORDER — LISINOPRIL 10 MG PO TABS
10.0000 mg | ORAL_TABLET | Freq: Once | ORAL | Status: AC
  Administered 2017-02-02: 10 mg via ORAL
  Filled 2017-02-02: qty 1

## 2017-02-02 MED ORDER — METOCLOPRAMIDE HCL 5 MG/ML IJ SOLN
20.0000 mg | Freq: Once | INTRAMUSCULAR | Status: DC
  Filled 2017-02-02: qty 4

## 2017-02-02 MED ORDER — METOCLOPRAMIDE HCL 5 MG/ML IJ SOLN
10.0000 mg | Freq: Once | INTRAMUSCULAR | Status: AC
  Administered 2017-02-02: 10 mg via INTRAMUSCULAR
  Filled 2017-02-02: qty 2

## 2017-02-02 NOTE — ED Triage Notes (Signed)
Per Pt, PT is coming from home with complaints of increased HTN, SOB x 2 weeks with some Chest pain last night and this morning. Reports a headache that has continued as well.

## 2017-02-02 NOTE — ED Provider Notes (Signed)
[facility] EMERGENCY DEPARTMENT Provider Note   CSN: [id number] Arrival date & time: 02/02/17  4098  History   Chief Complaint Chief Complaint  Patient presents with  . Shortness of Breath  . Hypertension   HPI Valerie Fletcher is a 46 y.o. female with a PMHx significant for GERD, asthma, and HTN who presented to the ED high blood pressure and headaches. She was diagnosed with HTN in 2004 after the birth of her last child. She states that she is on Atenolol for her HTN and was supposed to be on an HCTZ-Losartan combination pill but stopped them 4 weeks ago due to side effects. She reports grogginess with the HCTZ. She has tried amlodipine, lisinopril, and benazepril in the past without complications but discontinued them due to suboptimal BP control. She recently moved to the area from [place] and has not established with a PCP. She states that her headaches are due to her high blood pressure. They are bilateral and non-throbbing. She denies visual changes, weakness/numbness, headaches that awake her from sleep, N/V, abdominal pain, photophobia, phonophobia, increased lacrimation. Reports normal TSH in the past.   She also voices some concerns about SOB on exertion. She does have a history of asthma and states that her inhalers do help with the exertional dyspnea. She denies productive cough, fevers/chills, recent illness, or sick contact.   Past Medical History:  Diagnosis Date  . Arthritis   . Asthma   . GERD (gastroesophageal reflux disease)   . Hypertension    There are no active problems to display for this patient.  Past Surgical History:  Procedure Laterality Date  . HERNIA REPAIR    . TUBAL LIGATION     OB History    No data available     Home Medications    Prior to Admission medications   Medication Sig Start Date End Date Taking? Authorizing Provider  albuterol (PROVENTIL HFA;VENTOLIN HFA) 108 (90 Base) MCG/ACT inhaler Inhale 1 puff into the lungs  every 4 (four) hours as needed for wheezing or shortness of breath.   Yes [provider]  aspirin-acetaminophen-caffeine (EXCEDRIN MIGRAINE) [phone] MG tablet Take 1 tablet by mouth every 6 (six) hours as needed for headache.   Yes [provider]  atenolol (TENORMIN) 25 MG tablet Take 25 mg by mouth daily.   Yes [provider]  diphenhydrAMINE (BENADRYL) 25 MG tablet Take 25 mg by mouth every 6 (six) hours as needed for itching.   Yes [provider]  pantoprazole (PROTONIX) 40 MG tablet Take 40 mg by mouth daily.   Yes [provider]  ranitidine (ZANTAC) 150 MG tablet Take 150 mg by mouth daily as needed for heartburn.   Yes [provider]  chlorthalidone (HYGROTON) 50 MG tablet Take 1 tablet (50 mg total) by mouth daily. 02/02/17   Levora Dredge, MD  lisinopril (PRINIVIL,ZESTRIL) 10 MG tablet Take 1 tablet (10 mg total) by mouth daily. 02/02/17   Levora Dredge, MD   Family History History reviewed. No pertinent family history.  Social History Social History   Tobacco Use  . Smoking status: Never Smoker  . Smokeless tobacco: Never Used  Substance Use Topics  . Alcohol use: Yes    Comment: Occasionally   . Drug use: No   Allergies   Prednisone; Compazine [prochlorperazine edisylate]; Dilaudid [hydromorphone]; and Percocet [oxycodone-acetaminophen]  Review of Systems  All systems reviewed and are negative for acute change except as noted in the HPI.  Physical Exam  Updated Vital Signs BP (!) 153/96   Pulse 73   Temp 97.9 F (36.6 C) (Oral)   Resp 16   Ht 5\' 6"  (1.676 m)   Wt 74.8 kg (165 lb)   LMP 01/15/2017   SpO2 100%   BMI 26.63 kg/m   General: Thin female in no acute distress HENT: Normocephalic, atraumatic, moist mucus membranes  Pulm: Good air movement with no wheezing or crackles  CV: RRR, no murmurs, no rubs  Abdomen: Active bowel sounds, soft, non-distended, no tenderness to palpation  Extremities:  No LE edema, radial pulses palpable bilaterally   Skin: Warm and dry  Neuro: Alert and oriented x 3  EKG: Sinus rhythm with normal axis and intervals, no ST elevation or LBBB  CXR: Good penetrance and rotation. No bony or soft tissue abnormalities. Hyperinflated lung fields with no cardiomegaly, no pleural effusions, no opacities or infiltrates.   ED Treatments / Results  Labs (all labs ordered are listed, but only abnormal results are displayed) Labs Reviewed  BASIC METABOLIC PANEL - Abnormal; Notable for the following components:      Result Value   Glucose, Bld 119 (*)    Creatinine, Ser 1.03 (*)    All other components within normal limits  CBC - Abnormal; Notable for the following components:   Hemoglobin 10.4 (*)    HCT 32.6 (*)    All other components within normal limits  I-STAT TROPONIN, ED  I-STAT BETA HCG BLOOD, ED ([facility], [facility], [facility] ONLY)   EKG  EKG Interpretation  Date/Time:  Friday February 02 2017 09:13:22 EST Ventricular Rate:  72 PR Interval:  154 QRS Duration: 66 QT Interval:  386 QTC Calculation: 422 R Axis:   55 Text Interpretation:  Normal sinus rhythm with sinus arrhythmia Nonspecific T wave abnormality Abnormal ekg Confirmed by Gerhard MunchLockwood, Robert [phone]([id number]) on 02/02/2017 11:37:03 AM      Radiology Dg Chest 2 View  Result Date: 02/02/2017 CLINICAL DATA:  Chest pain EXAM: CHEST  2 VIEW COMPARISON:  None. FINDINGS: Mild hyperinflation of the lungs. Biapical scarring. Heart is mildly enlarged. No confluent opacities or effusions. No edema. No acute bony abnormality. IMPRESSION: Hyperinflation.  Mild cardiomegaly.  No active disease. Electronically Signed   By: Charlett NoseKevin  Dover M.D.   On: 02/02/2017 09:45   Procedures Procedures (including critical care time)  Medications Ordered in ED Medications  chlorthalidone (HYGROTON) tablet 50 mg (not administered)  metoCLOPramide (REGLAN) injection 10 mg (10 mg Intramuscular Not Given 02/02/17 1302)  lisinopril  (PRINIVIL,ZESTRIL) tablet 10 mg (10 mg Oral Given 02/02/17 1152)  diphenhydrAMINE (BENADRYL) capsule 25 mg (25 mg Oral Given 02/02/17 1152)   Initial Impression / Assessment and Plan / ED Course  I have reviewed the triage vital signs and the nursing notes.  Pertinent labs & imaging results that were available during my care of the patient were reviewed by me and considered in my medical decision making (see chart for details).    Discussed that patient will need to establish with a PCP and we will start Chlorthalidone 50 mg and lisinopril 10 mg. She agrees and will follow-up with the internal medicine residency clinic on 02/14/17 @ 10:45 AM. She was given prescriptions prior to discharge.   Her headaches were treated acutely with Reglan and benadryl. She experienced complete resolution. At this point I feel her headaches are likely benign in nature and she currently lacks any red flags signs/symptoms to warrant further investigation/imaging.   Plan was discussed with the patient.  All questions and concerns addressed. She was given return precautions.   Final Clinical Impressions(s) / ED Diagnoses   Final diagnoses:  Essential hypertension  Nonintractable headache, unspecified chronicity pattern, unspecified headache type   ED Discharge Orders        Ordered    chlorthalidone (HYGROTON) 50 MG tablet  Daily     02/02/17 1312    lisinopril (PRINIVIL,ZESTRIL) 10 MG tablet  Daily     02/02/17 1312       Levora DredgeHelberg, Avalynne Diver, MD 02/02/17 1324    Gerhard MunchLockwood, Robert, MD 02/04/17 [phone]

## 2017-02-14 ENCOUNTER — Ambulatory Visit (INDEPENDENT_AMBULATORY_CARE_PROVIDER_SITE_OTHER): Payer: BLUE CROSS/BLUE SHIELD | Admitting: Internal Medicine

## 2017-02-14 ENCOUNTER — Other Ambulatory Visit: Payer: Self-pay

## 2017-02-14 VITALS — BP 138/82 | HR 84 | Temp 98.3°F | Ht 64.0 in | Wt 161.5 lb

## 2017-02-14 DIAGNOSIS — I10 Essential (primary) hypertension: Secondary | ICD-10-CM

## 2017-02-14 DIAGNOSIS — N920 Excessive and frequent menstruation with regular cycle: Secondary | ICD-10-CM

## 2017-02-14 DIAGNOSIS — K219 Gastro-esophageal reflux disease without esophagitis: Secondary | ICD-10-CM

## 2017-02-14 DIAGNOSIS — Z9851 Tubal ligation status: Secondary | ICD-10-CM | POA: Diagnosis not present

## 2017-02-14 DIAGNOSIS — Z8249 Family history of ischemic heart disease and other diseases of the circulatory system: Secondary | ICD-10-CM | POA: Diagnosis not present

## 2017-02-14 DIAGNOSIS — R5383 Other fatigue: Secondary | ICD-10-CM

## 2017-02-14 DIAGNOSIS — Z8052 Family history of malignant neoplasm of bladder: Secondary | ICD-10-CM

## 2017-02-14 DIAGNOSIS — K2100 Gastro-esophageal reflux disease with esophagitis, without bleeding: Secondary | ICD-10-CM

## 2017-02-14 DIAGNOSIS — D649 Anemia, unspecified: Secondary | ICD-10-CM

## 2017-02-14 DIAGNOSIS — Z0189 Encounter for other specified special examinations: Secondary | ICD-10-CM | POA: Diagnosis not present

## 2017-02-14 DIAGNOSIS — J45909 Unspecified asthma, uncomplicated: Secondary | ICD-10-CM | POA: Insufficient documentation

## 2017-02-14 DIAGNOSIS — Z800 Family history of malignant neoplasm of digestive organs: Secondary | ICD-10-CM | POA: Diagnosis not present

## 2017-02-14 DIAGNOSIS — Z79899 Other long term (current) drug therapy: Secondary | ICD-10-CM | POA: Diagnosis not present

## 2017-02-14 DIAGNOSIS — Z833 Family history of diabetes mellitus: Secondary | ICD-10-CM | POA: Diagnosis not present

## 2017-02-14 DIAGNOSIS — J4520 Mild intermittent asthma, uncomplicated: Secondary | ICD-10-CM

## 2017-02-14 HISTORY — DX: Other fatigue: R53.83

## 2017-02-14 MED ORDER — OLMESARTAN MEDOXOMIL 20 MG PO TABS: 20.0000 mg | ORAL_TABLET | Freq: Every day | ORAL | 1 refills | Status: DC

## 2017-02-14 MED ORDER — PANTOPRAZOLE SODIUM 40 MG PO TBEC: 40.0000 mg | DELAYED_RELEASE_TABLET | Freq: Every day | ORAL | 5 refills | Status: DC

## 2017-02-14 MED ORDER — RANITIDINE HCL 150 MG PO TABS: 150.0000 mg | ORAL_TABLET | Freq: Every day | ORAL | 5 refills | Status: DC | PRN

## 2017-02-14 NOTE — Assessment & Plan Note (Addendum)
Patient currently takes pantoprazole 40 mg daily and reports good control with this medication. Sometimes needs to use ranitidine for excessive reflux symptoms. Patient requests refills on this medication today. Refills provided.  Plan: -Continue pantoprazole 40 mg daily, ranitidine 150 mg PRN -Consider referral to local GI if symptoms worsen

## 2017-02-14 NOTE — Assessment & Plan Note (Addendum)
Patient's BP within goal of <140/90 today. Patient reports fatigue with multiple different  medications in multiple classes of anti-hypertensives. Patient has been stable on atenolol for many years, but it more likely that her side effects of fatigue is a result of this class of medications rather than diuretics, Ace-I/ARB, and CCB. The patient is intermittently taking BP medications as a result of side effects, so will plan to make changes to regimen today so that we can see if we can find a regiment that she is able to take reliably. Plan to start patient on olmesartan 20 mg daily and return to clinic in 1 month for follow up.  Plan: -Discontinue atenolol 25 mg, lisinopril 10 mg, and chlorthalidone 50 mg -START omlesartan 20 mg daily -RTC in 4 weeks for follow up of BP

## 2017-02-14 NOTE — Assessment & Plan Note (Addendum)
Patient endorses much fatigue that has happened for a long time and is thought to be associated with use of different anti-hypertensive medications. It is possible that her fatigue is a result of atenolol use, so will try trial off this medication. Patient has normocytic anemia on CBC obtained at recent ED visit and states that she has heavy menstrual bleeding. Will obtain ferritin to examine iron stores for iron deficiency. Will also obtain vitamin D, TSH, and HIV to address other causes of fatigue. Will also check HgbA1C since patient had elevated BG on blood work observed in ED.  Plan: -Follow up TSH, vit D, HIV, ferritin, and A1C -Reassess symptoms at follow up in 1 month after trial off atenolol  ADDENDUM: -Patient informed of results of blood work. TSH wnl. HIV nonreactive. Ferritin, low limit normal. A1C, pre diabetes. Vit D pending -Patient informed about dietary modifications she can make to manage blood sugar - patient aims to remove the daily Kool-AID from her diet to help with BG control and weight loss.  -During conversation, patient reports continued fatigue and states that she thinks she has some swelling in her armpit. She inquires about a mammogram. She states that she had a normal mammogram at the beginning of the year but would like another one next year to make sure. I asked her to bring all available medical records to next visit so that we can get them scanned into our system

## 2017-02-14 NOTE — Progress Notes (Signed)
CC: HTN  HPI:  Valerie Fletcher is a 46 y.o. with PMH of HTN and GERD who is here to establish care in the clinic for her chronic medical conditions. The patient recently moved to the area this summer from [place] and was referred to the clinic by ED doctor during recent visit. Patient went to ED visit for headache/migraine relief and states that her headache quickly resolved after that visit. No acute complaints today. At the ED visit her BP was elevated and she was started on lisinopril 10 mg and chlorthalidone 50 mg in addition to her previous medication of atenolol 25 mg daily (12.5 mg BID). Patient states that she took chlorthalidone for a few days but this medication made her feel fatigued and groggy, so she stopped taking it. She has not taken this medication in a week. She also states that every time she takes lisinopril, she feels tired and groggy. She manages to take this medication every other day, however, because she knows it is important to control BP. She takes atenolol without difficulty and has taken this for years. She has experienced fatigue, grogginess, and what she describes as muscle cramps/difficulty moving muscles with multiple anti-hypertensive medications in the past, including losartan, lisinopril, benazepril, HCTZ, and chlorthalidone. She states she has tried amlodipine before too, however this medication didn't control BP.   Patient works full time as a Corporate treasurerrelease of information specialist. She has four children. Denies any difficulty taking and affording medications daily.  Past Medical History: Past Medical History:  Diagnosis Date  . Arthritis   . Asthma   . GERD (gastroesophageal reflux disease)   . Hypertension    Past Surgical History: Past Surgical History:  Procedure Laterality Date  . HERNIA REPAIR     Umbilical hernia x2, Hiatial hernia  . TUBAL LIGATION    . URETHRAL DIVERTICULECTOMY  03/2016   Family History: Family History  Problem Relation Age of  Onset  . Hypertension Mother   . Hypertension Father   . Hypertension Brother   . Bladder Cancer Maternal Grandmother 2073       Passed away from disease 2007  . Diabetes Maternal Grandmother   . Pancreatic cancer Paternal Grandmother   . Diabetes Paternal Grandmother    Social History: Social History   Tobacco Use  . Smoking status: Never Smoker  . Smokeless tobacco: Never Used  Substance Use Topics  . Alcohol use: Yes    Alcohol/week: 0.6 oz    Types: 1 Glasses of wine per week  . Drug use: No   Review of Systems:   Patient denies chest pain, shortness of breath, abdominal pain, diaphoresis, nausea/vomiting, lower extremity swelling, and change in bowel/bladder habits.  Physical Exam:  Vitals:   02/14/17 1058  BP: 138/82  Pulse: 84  Temp: 98.3 F (36.8 C)  TempSrc: Oral  SpO2: 100%  Weight: 161 lb 8 oz (73.3 kg)  Height: 5\' 4"  (1.626 m)   Physical Exam  Constitutional: She appears well-developed and well-nourished. No distress.  HENT:  Mouth/Throat: Oropharynx is clear and moist. No oropharyngeal exudate.  Eyes: Conjunctivae and EOM are normal. Pupils are equal, round, and reactive to light.  Neck: No thyromegaly present.  Cardiovascular: Normal rate and regular rhythm. Exam reveals no friction rub.  No murmur heard. Respiratory: Effort normal. No respiratory distress. She has no wheezes.  No crackles  GI: Soft. Bowel sounds are normal. She exhibits no distension. There is no tenderness.  Lymphadenopathy:    She has  no cervical adenopathy.  Neurological:  Face strength and sensation intact bilaterally. Tongue midline. Gross motor and sensation to light touch of upper and lower extremities intact bilaterally.   Skin: Skin is warm and dry. No rash noted. She is not diaphoretic. No erythema.  Psychiatric: She has a normal mood and affect. Her behavior is normal. Thought content normal.   Assessment & Plan:   See Encounters Tab for problem based  charting.  Patient seen with Dr. Oswaldo DoneVincent.

## 2017-02-14 NOTE — Patient Instructions (Addendum)
FOLLOW-UP INSTRUCTIONS When: 1 month For: Blood pressure follow up What to bring: Prescription medications  Thank you for seeing us in the clinic today! We are excited that we can help take care of you!  You were evaluated for high blood pressure and your other chronic medical conditions. We got some screening labs today, I will call you later this week with the results. We also changed around your blood pressure medication. Please stop taking atenolol, chlorthalidone, and lisinopril. Please start taking olmesartan 20 mg daily. You should take 1 tablet every day until you are seen in our clinic again for your blood pressure.  Please return to the clinic in 3-4 weeks for follow up of your blood pressure.  If you have any questions or concerns, please call our clinic at [phone] between the hours of 9am-5pm. If you have a problem after these hours, please call [phone] and ask for the internal medicine resident on call. If you feel you are having a medical emergency please call 911.   Thanks, Dr. Jeanella FlatteryMarybeth Velta [place]

## 2017-02-16 NOTE — Progress Notes (Signed)
Internal Medicine Clinic Attending  I saw and evaluated the patient.  I personally confirmed the key portions of the history and exam documented by Dr. Nedrud and I reviewed pertinent patient test results.  The assessment, diagnosis, and plan were formulated together and I agree with the documentation in the resident's note.  

## 2017-02-19 LAB — VITAMIN D 1,25 DIHYDROXY
VITAMIN D3 1, 25 (OH): 50 pg/mL
Vitamin D 1, 25 (OH)2 Total: 50 pg/mL
Vitamin D2 1, 25 (OH)2: <10 pg/mL

## 2017-02-19 LAB — HEMOGLOBIN A1C
ESTIMATED AVERAGE GLUCOSE: 123 mg/dL
HEMOGLOBIN A1C: 5.9 % — AB (ref 4.8–5.6)

## 2017-02-19 LAB — FERRITIN: Ferritin: 35 ng/mL (ref 15–150)

## 2017-02-19 LAB — TSH: TSH: 1.16 u[IU]/mL (ref 0.450–4.500)

## 2017-02-19 LAB — HIV ANTIBODY (ROUTINE TESTING W REFLEX): HIV Screen 4th Generation wRfx: NONREACTIVE

## 2017-03-16 ENCOUNTER — Encounter

## 2017-03-17 MED ORDER — PANTOPRAZOLE 40 MG TAB, DELAYED RELEASE
40 mg | ORAL_TABLET | ORAL | 5 refills | Status: AC
Start: 2017-03-17 — End: ?

## 2017-04-15 ENCOUNTER — Other Ambulatory Visit: Payer: Self-pay | Admitting: Internal Medicine

## 2017-04-15 DIAGNOSIS — I10 Essential (primary) hypertension: Secondary | ICD-10-CM

## 2017-06-08 ENCOUNTER — Other Ambulatory Visit: Payer: Self-pay | Admitting: Internal Medicine

## 2017-06-08 DIAGNOSIS — I10 Essential (primary) hypertension: Secondary | ICD-10-CM

## 2017-06-12 ENCOUNTER — Other Ambulatory Visit: Payer: Self-pay | Admitting: *Deleted

## 2017-06-12 ENCOUNTER — Telehealth: Payer: Self-pay | Admitting: General Practice

## 2017-06-12 DIAGNOSIS — I10 Essential (primary) hypertension: Secondary | ICD-10-CM

## 2017-06-12 MED ORDER — OLMESARTAN MEDOXOMIL 20 MG PO TABS: 20.0000 mg | ORAL_TABLET | Freq: Every day | ORAL | 0 refills | Status: DC

## 2017-06-12 NOTE — Telephone Encounter (Signed)
Requested earlier this day

## 2017-06-12 NOTE — Telephone Encounter (Signed)
Pharmacy is calling about olmesartan 20mg  refill request

## 2017-06-29 ENCOUNTER — Ambulatory Visit (INDEPENDENT_AMBULATORY_CARE_PROVIDER_SITE_OTHER): Payer: BLUE CROSS/BLUE SHIELD | Admitting: Internal Medicine

## 2017-06-29 ENCOUNTER — Ambulatory Visit (HOSPITAL_COMMUNITY)
Admission: RE | Admit: 2017-06-29 | Discharge: 2017-06-29 | Disposition: A | Discharge status: Home / Self Care | Payer: BLUE CROSS/BLUE SHIELD | Source: Ambulatory Visit | Attending: Family Medicine | Admitting: Family Medicine

## 2017-06-29 ENCOUNTER — Other Ambulatory Visit: Payer: Self-pay

## 2017-06-29 VITALS — BP 174/102 | HR 97 | Temp 98.1°F | Ht 64.0 in | Wt 162.9 lb

## 2017-06-29 DIAGNOSIS — I10 Essential (primary) hypertension: Secondary | ICD-10-CM | POA: Insufficient documentation

## 2017-06-29 DIAGNOSIS — J45909 Unspecified asthma, uncomplicated: Secondary | ICD-10-CM | POA: Diagnosis not present

## 2017-06-29 DIAGNOSIS — Z79899 Other long term (current) drug therapy: Secondary | ICD-10-CM | POA: Diagnosis not present

## 2017-06-29 DIAGNOSIS — R5383 Other fatigue: Secondary | ICD-10-CM | POA: Diagnosis not present

## 2017-06-29 DIAGNOSIS — R002 Palpitations: Secondary | ICD-10-CM | POA: Insufficient documentation

## 2017-06-29 DIAGNOSIS — R0683 Snoring: Secondary | ICD-10-CM | POA: Diagnosis not present

## 2017-06-29 DIAGNOSIS — K219 Gastro-esophageal reflux disease without esophagitis: Secondary | ICD-10-CM | POA: Insufficient documentation

## 2017-06-29 MED ORDER — OLMESARTAN MEDOXOMIL 40 MG PO TABS: 40.0000 mg | ORAL_TABLET | Freq: Every day | ORAL | 2 refills | Status: DC

## 2017-06-29 MED ORDER — PANTOPRAZOLE SODIUM 40 MG PO TBEC: 40.0000 mg | DELAYED_RELEASE_TABLET | Freq: Every day | ORAL | 5 refills | Status: DC

## 2017-06-29 MED ORDER — CARVEDILOL 3.125 MG PO TABS: 3.1250 mg | ORAL_TABLET | Freq: Two times a day (BID) | ORAL | 0 refills | Status: DC

## 2017-06-29 NOTE — Progress Notes (Signed)
CC: neck dysymmetry  HPI:  Valerie Fletcher is a 47 y.o. female with PMH below.  She presents today with concerns of having rapid palpitations associated with shortness of breath and presyncopal symptoms.  She also was concerned that her neck may not be symmetrical and could be enlarged on the left side, she states that her mother thinks she has just gained weight.    Please see A&P for status of the patient's chronic medical conditions  Past Medical History:  Diagnosis Date  . Asthma   . GERD (gastroesophageal reflux disease)   . Hypertension    Review of Systems:  ROS: Pulmonary: pt denies increased work of breathing, shortness of breath,  Cardiac: pt endorses occasional palpitations, she denies chest pain,  Abdominal: pt denies abdominal pain, nausea, vomiting, or diarrhea  Physical Exam:  Vitals:   06/29/17 1406  BP: (!) 174/102  Pulse: 97  Temp: 98.1 F (36.7 C)  TempSrc: Oral  SpO2: 100%  Weight: 162 lb 14.4 oz (73.9 kg)  Height:  (1.626 m)   Physical Exam  Constitutional: She is oriented to person, place, and time. No distress.  HENT:  Head: Normocephalic and atraumatic.  Mouth/Throat: No oropharyngeal exudate or posterior oropharyngeal edema.  Neck: No neck rigidity. No thyroid mass and no thyromegaly present.  Neck appears symmetrical, no pain on palpation, no lymphadenopathy appreciated  Cardiovascular: Normal rate and normal heart sounds. Exam reveals no gallop and no friction rub.  No murmur heard. Subtle irregularity in rhythm  Pulmonary/Chest: Effort normal and breath sounds normal. No respiratory distress. She has no wheezes. She has no rales. She exhibits no tenderness.  Abdominal: Soft. Bowel sounds are normal. She exhibits no distension and no mass. There is no tenderness. There is no rebound and no guarding.  Neurological: She is alert and oriented to person, place, and time.  Skin: She is not diaphoretic.    Social History   Socioeconomic  History  . Marital status: Single    Spouse name: Not on file  . Number of children: 4  . Years of education: Not on file  . Highest education level: Associate degree: academic program  Occupational History  . Not on file  Social Needs  . Financial resource strain: Not on file  . Food insecurity:    Worry: Not on file    Inability: Not on file  . Transportation needs:    Medical: Not on file    Non-medical: Not on file  Tobacco Use  . Smoking status: Never Smoker  . Smokeless tobacco: Never Used  Substance and Sexual Activity  . Alcohol use: Yes    Alcohol/week: 0.6 oz    Types: 1 Glasses of wine per week  . Drug use: No  . Sexual activity: Yes    Partners: Male    Birth control/protection: Condom    Comment: s/p tubal ligation  Lifestyle  . Physical activity:    Days per week: Not on file    Minutes per session: Not on file  . Stress: Not on file  Relationships  . Social connections:    Talks on phone: Not on file    Gets together: Not on file    Attends religious service: Not on file    Active member of club or organization: Not on file    Attends meetings of clubs or organizations: Not on file    Relationship status: Not on file  . Intimate partner violence:    Fear of  current or ex partner: Not on file    Emotionally abused: Not on file    Physically abused: Not on file    Forced sexual activity: Not on file  Other Topics Concern  . Not on file  Social History Narrative  . Not on file    Family History  Problem Relation Age of Onset  . Hypertension Mother   . Hypertension Father   . Hypertension Brother   . Bladder Cancer Maternal Grandmother 15       Passed away from disease 2005-07-21  . Diabetes Maternal Grandmother   . Pancreatic cancer Paternal Grandmother   . Diabetes Paternal Grandmother     Assessment & Plan:   See Encounters Tab for problem based charting.  Patient discussed with Dr. Cleda Daub

## 2017-06-29 NOTE — Patient Instructions (Addendum)
Ms. Keough, your symptoms of palpitations and feeling as if you're going to pass out will need to be followed up.  You will need a referral to get a cardiac monitoring device to help [place] see which rhythm is causing this.  Important things for [place] to focus on is diagnosing whether or not you have sleep apnea.  Additionally, we will need to get better control of your blood pressure.  We will increase your dose of olmesartan to  daily.  We will also add another blood pressure medication to your regimen called carvedilol we will try the lower dose for 2 weeks and see how you tolerate it.  Please come back in two weeks to follow up on your bp

## 2017-06-30 DIAGNOSIS — E785 Hyperlipidemia, unspecified: Secondary | ICD-10-CM | POA: Insufficient documentation

## 2017-06-30 LAB — BMP8+ANION GAP
Anion Gap: 17 mmol/L (ref 10.0–18.0)
BUN/Creatinine Ratio: 16 (ref 9–23)
BUN: 18 mg/dL (ref 6–24)
CO2: 22 mmol/L (ref 20–29)
Calcium: 10.2 mg/dL (ref 8.7–10.2)
Chloride: 100 mmol/L (ref 96–106)
Creatinine, Ser: 1.11 mg/dL — ABNORMAL HIGH (ref 0.57–1.00)
GFR calc Af Amer: 68 mL/min/{1.73_m2} (ref >59)
GFR calc non Af Amer: 59 mL/min/{1.73_m2} — ABNORMAL LOW (ref >59)
Glucose: 106 mg/dL — ABNORMAL HIGH (ref 65–99)
Potassium: 4.9 mmol/L (ref 3.5–5.2)
Sodium: 139 mmol/L (ref 134–144)

## 2017-06-30 LAB — LIPID PANEL
Chol/HDL Ratio: 4.5 ratio — ABNORMAL HIGH (ref 0.0–4.4)
Cholesterol, Total: 272 mg/dL — ABNORMAL HIGH (ref 100–199)
HDL: 61 mg/dL (ref >39)
LDL Calculated: 185 mg/dL — ABNORMAL HIGH (ref 0–99)
Triglycerides: 130 mg/dL (ref 0–149)
VLDL Cholesterol Cal: 26 mg/dL (ref 5–40)

## 2017-06-30 NOTE — Assessment & Plan Note (Addendum)
Over the last two months the patient reports random episodes of tachycardia resulting in shortness of breath and feeling like she is going to pass out.  She goes and lays down and after a few minutes she reports that it resolves.  It is not associated with any chest pain.  She has never felt any chest pain with exertion.    -ecg here in office shows sinus arrhythmia which is chronic some peaking of p waves suggestive of enlarged RA does not meet criteria for RAE  -I am concerned the patient is having PAF, will order cardiac monitor one month event monitor be placed -will order sleep study as pt reports heavy snoring, daytime sleepiness, not feeling refreshed after sleep and occasional morning headaches -will check a lipid panel as well as sinus arrhythmia could indicate a diseased sinus node due to CAD

## 2017-06-30 NOTE — Assessment & Plan Note (Signed)
Pt reports daytimes somnolence, not feeling refreshed after sleep, morning headaches.  She snores loudly each night " I wake up the whole house".  It is likely that pt has sleep apnea and this is the cause of her fatigue noted at last visit, that has continued. TSH, T2DM and anemia workup negative during last visit.    -sleep study

## 2017-06-30 NOTE — Assessment & Plan Note (Signed)
BP Readings from Last 3 Encounters:  06/29/17 (!) 174/102  02/14/17 138/82  02/02/17 (!) 153/96   Pt did not return for BP recheck after last visit. Due to perceived side effects pt was restarted from scratch all bp meds dced and pt was started on olmesartan .  Today her bp is quite elevated.  She reports good compliance with the olmesartan.  Pt reports that she was on atenolol in the past due to feeling extra heart beats that were uncomfortable.  -will increase olmesartan to  daily -will add carvedilol 3.125mg  -will get labs to get a baseline

## 2017-07-03 NOTE — Progress Notes (Signed)
Internal Medicine Clinic Attending  Case discussed with Dr. Winfrey  at the time of the visit.  We reviewed the resident's history and exam and pertinent patient test results.  I agree with the assessment, diagnosis, and plan of care documented in the resident's note.  

## 2017-07-11 ENCOUNTER — Ambulatory Visit (INDEPENDENT_AMBULATORY_CARE_PROVIDER_SITE_OTHER): Payer: BLUE CROSS/BLUE SHIELD

## 2017-07-11 DIAGNOSIS — R002 Palpitations: Secondary | ICD-10-CM | POA: Diagnosis not present

## 2017-07-12 ENCOUNTER — Telehealth: Payer: Self-pay | Admitting: Internal Medicine

## 2017-07-12 DIAGNOSIS — E785 Hyperlipidemia, unspecified: Secondary | ICD-10-CM

## 2017-07-12 MED ORDER — ROSUVASTATIN CALCIUM 20 MG PO TABS: 20.0000 mg | ORAL_TABLET | Freq: Every day | ORAL | 3 refills | Status: DC

## 2017-07-12 NOTE — Telephone Encounter (Signed)
Spoke with pt about hyperlipidemia and need to start cholesterol medication.  Pt agreed and was thankful for call.

## 2017-07-13 ENCOUNTER — Ambulatory Visit: Payer: BLUE CROSS/BLUE SHIELD

## 2017-07-18 ENCOUNTER — Telehealth: Payer: Self-pay | Admitting: Internal Medicine

## 2017-07-18 DIAGNOSIS — I10 Essential (primary) hypertension: Secondary | ICD-10-CM

## 2017-07-18 DIAGNOSIS — R002 Palpitations: Secondary | ICD-10-CM

## 2017-07-18 NOTE — Telephone Encounter (Signed)
Rec'd call from [facility]  Patient has been denied for a In Lab test for their facility but, is approved for an in home Sleep Sleep Study.  Per their office please place a new order for a In Home Sleep Study to be performed.

## 2017-07-18 NOTE — Telephone Encounter (Signed)
Pt only approved for home sleep test-ordered for pt

## 2017-07-18 NOTE — Telephone Encounter (Signed)
Ordered for pt chose other no location matched home is this order correct?

## 2017-07-20 ENCOUNTER — Encounter: Payer: Self-pay | Admitting: Internal Medicine

## 2017-07-27 ENCOUNTER — Ambulatory Visit (INDEPENDENT_AMBULATORY_CARE_PROVIDER_SITE_OTHER): Payer: BLUE CROSS/BLUE SHIELD | Admitting: Internal Medicine

## 2017-07-27 ENCOUNTER — Other Ambulatory Visit: Payer: Self-pay

## 2017-07-27 VITALS — BP 115/98 | HR 128 | Temp 98.6°F | Ht 64.0 in | Wt 162.8 lb

## 2017-07-27 DIAGNOSIS — R002 Palpitations: Secondary | ICD-10-CM

## 2017-07-27 DIAGNOSIS — Z79899 Other long term (current) drug therapy: Secondary | ICD-10-CM

## 2017-07-27 DIAGNOSIS — I10 Essential (primary) hypertension: Secondary | ICD-10-CM

## 2017-07-27 DIAGNOSIS — R000 Tachycardia, unspecified: Secondary | ICD-10-CM | POA: Diagnosis not present

## 2017-07-27 DIAGNOSIS — Z8249 Family history of ischemic heart disease and other diseases of the circulatory system: Secondary | ICD-10-CM

## 2017-07-27 MED ORDER — METOPROLOL SUCCINATE ER 25 MG PO TB24
12.5000 mg | ORAL_TABLET | Freq: Every day | ORAL | 0 refills | Status: DC
Start: 2017-07-27 — End: 2017-09-03

## 2017-07-27 NOTE — Patient Instructions (Addendum)
Valerie Fletcher, good to see you again, we will start back your beta blocker today.  Your blood pressure is excellent today keep taking the benicar  daily.  The metoprolol will help with your heart rate please check your blood pressure on both of these medicines and call us if your blood pressure is low less than 115/60 cut your benicar pills in half and call me to let me know.  Please follow up in 3 weeks so we can go over your heart monitor results.

## 2017-07-27 NOTE — Progress Notes (Signed)
CC: HTN, palpitations follow up  HPI:  [name] is a 47 y.o. female with PMH below.  She is here to follow up on her HTN and palpitations.    Please see A&P for status of the patient's chronic medical conditions  Past Medical History:  Diagnosis Date  . Asthma   . GERD (gastroesophageal reflux disease)   . Hypertension    Review of Systems:  ROS: Pulmonary: pt denies increased work of breathing, shortness of breath,  Cardiac: pt denies palpitations, chest pain,  Abdominal: pt denies abdominal pain, nausea, vomiting, or diarrhea  Physical Exam:  Vitals:   07/27/17 1602  BP: (!) 115/98  Pulse: (!) 128  Temp: 98.6 F (37 C)  TempSrc: Oral  SpO2: 99%  Weight: 162 lb 12.8 oz (73.8 kg)  Height: 5\' 4"  (1.626 m)   Physical Exam  Constitutional: No distress.  Cardiovascular: Regular rhythm and normal heart sounds. Tachycardia present. Exam reveals no gallop and no friction rub.  No murmur heard. Pulmonary/Chest: Effort normal and breath sounds normal. No respiratory distress. She has no wheezes. She has no rales. She exhibits no tenderness.  Abdominal: Soft. Bowel sounds are normal. She exhibits no distension and no mass. There is no tenderness. There is no rebound and no guarding.  Neurological: She is alert.  Skin: She is not diaphoretic.    Social History   Socioeconomic History  . Marital status: Single    Spouse name: Not on file  . Number of children: 4  . Years of education: Not on file  . Highest education level: Associate degree: academic program  Occupational History  . Not on file  Social Needs  . Financial resource strain: Not on file  . Food insecurity:    Worry: Not on file    Inability: Not on file  . Transportation needs:    Medical: Not on file    Non-medical: Not on file  Tobacco Use  . Smoking status: Never Smoker  . Smokeless tobacco: Never Used  Substance and Sexual Activity  . Alcohol use: Yes    Alcohol/week: 0.6 oz    Types:  1 Glasses of wine per week  . Drug use: No  . Sexual activity: Yes    Partners: Male    Birth control/protection: Condom    Comment: s/p tubal ligation  Lifestyle  . Physical activity:    Days per week: Not on file    Minutes per session: Not on file  . Stress: Not on file  Relationships  . Social connections:    Talks on phone: Not on file    Gets together: Not on file    Attends religious service: Not on file    Active member of club or organization: Not on file    Attends meetings of clubs or organizations: Not on file    Relationship status: Not on file  . Intimate partner violence:    Fear of current or ex partner: Not on file    Emotionally abused: Not on file    Physically abused: Not on file    Forced sexual activity: Not on file  Other Topics Concern  . Not on file  Social History Narrative  . Not on file    Family History  Problem Relation Age of Onset  . Hypertension Mother   . Hypertension Father   . Hypertension Brother   . Bladder Cancer Maternal Grandmother [id number]       Passed away from disease 2007  .  Diabetes Maternal Grandmother   . Pancreatic cancer Paternal Grandmother   . Diabetes Paternal Grandmother     Assessment & Plan:   See Encounters Tab for problem based charting.  Patient discussed with Dr. Cleda Daub

## 2017-07-28 LAB — BMP8+ANION GAP
Anion Gap: 16 mmol/L (ref 10.0–18.0)
BUN / CREAT RATIO: 14 (ref 9–23)
BUN: 15 mg/dL (ref 6–24)
CO2: 23 mmol/L (ref 20–29)
Calcium: 10 mg/dL (ref 8.7–10.2)
Chloride: 103 mmol/L (ref 96–106)
Creatinine, Ser: 1.11 mg/dL — ABNORMAL HIGH (ref 0.57–1.00)
GFR, EST AFRICAN AMERICAN: 68 mL/min/{1.73_m2} (ref >59)
GFR, EST NON AFRICAN AMERICAN: 59 mL/min/{1.73_m2} — AB (ref >59)
Glucose: 103 mg/dL — ABNORMAL HIGH (ref 65–99)
Potassium: 4.1 mmol/L (ref 3.5–5.2)
SODIUM: 142 mmol/L (ref 134–144)

## 2017-07-30 ENCOUNTER — Telehealth: Payer: Self-pay | Admitting: *Deleted

## 2017-07-30 NOTE — Assessment & Plan Note (Addendum)
BP Readings from Last 3 Encounters:  07/27/17 (!) 115/98  06/29/17 (!) 174/102  02/14/17 138/82   BP better this visit, has been out of carvedilol for over a week, missed follow up appointment and did not ask for refill.  Last visit we increased benicar to 40mg  daily.    -BP much better, don't need carvedilol to help with bp but heart rate too high currently, has been on a beta blocker of some sort for years, will start metoprolol succinate 12.5mg  today and have pt follow up for bp recheck and to go over cardiac monitor results -continue benicar 40mg  daily -labs checked today renal function and potassium stable

## 2017-07-30 NOTE — Telephone Encounter (Signed)
LMAM.  [employer] contacted via website to ship sensitive skin electrodes directly to your home.  Address repeated.  If you have and additional problems please call our office.  If you need additional supplies, please contact [employer] directly.  Their number is listed on your monitor, box and instruction booklet.  They are available 24/7.

## 2017-07-30 NOTE — Assessment & Plan Note (Addendum)
Pt still wearing cardiac monitor from last visit.  She was started on carvedilol 3.125mg  with plans to titrate up at follow up bp visit but pt missed appointment.  Pt still noted palpitations even with carvedilol.  No syncopal episodes and no sustained palpitations.    -will switch pt to metoprolol succinate 12.5mg  to be titrated as pt having rebound tachycardia today.   -She will follow up after her 30 day monitor has collected all the data to go over at her next visit. -lipid panel returned extremely elevated pt started on high intensity statin over the phone before this visit.

## 2017-07-31 NOTE — Progress Notes (Signed)
Internal Medicine Clinic Attending  Case discussed with Dr. Winfrey  at the time of the visit.  We reviewed the resident's history and exam and pertinent patient test results.  I agree with the assessment, diagnosis, and plan of care documented in the resident's note.  

## 2017-08-17 ENCOUNTER — Encounter (HOSPITAL_BASED_OUTPATIENT_CLINIC_OR_DEPARTMENT_OTHER): Payer: BLUE CROSS/BLUE SHIELD

## 2017-08-20 ENCOUNTER — Ambulatory Visit (HOSPITAL_BASED_OUTPATIENT_CLINIC_OR_DEPARTMENT_OTHER): Payer: BLUE CROSS/BLUE SHIELD | Attending: Internal Medicine | Admitting: Internal Medicine

## 2017-08-20 VITALS — Ht 64.0 in | Wt 162.0 lb

## 2017-08-20 DIAGNOSIS — G4733 Obstructive sleep apnea (adult) (pediatric): Secondary | ICD-10-CM | POA: Insufficient documentation

## 2017-08-20 DIAGNOSIS — I10 Essential (primary) hypertension: Secondary | ICD-10-CM | POA: Diagnosis not present

## 2017-08-20 DIAGNOSIS — R002 Palpitations: Secondary | ICD-10-CM | POA: Diagnosis not present

## 2017-08-20 DIAGNOSIS — R0683 Snoring: Secondary | ICD-10-CM | POA: Insufficient documentation

## 2017-08-26 DIAGNOSIS — G4733 Obstructive sleep apnea (adult) (pediatric): Secondary | ICD-10-CM

## 2017-08-26 NOTE — Procedures (Signed)
    Patient Name: Valerie Fletcher, Valerie Fletcher Study Date: 08/20/2017 Gender: Female D.O.B: 09-12-1970 Age (years): 46 Referring Provider: Alvira PhilipsEric C Hoffman DO Height (inches): 64 Interpreting Physician: Jetty Duhamellinton Young MD, ABSM Weight (lbs): 162 RPSGT: [place], [place] BMI: 28 MRN: [id number] Neck Size: 12.00  CLINICAL INFORMATION Sleep Study Type: HST Indication for sleep study: Hypertension  Epworth Sleepiness Score: 7  SLEEP STUDY TECHNIQUE A multi-channel overnight portable sleep study was performed. The channels recorded were: nasal airflow, thoracic respiratory movement, and oxygen saturation with a pulse oximetry. Snoring was also monitored.  MEDICATIONS Patient self administered medications include: none reported.  SLEEP ARCHITECTURE Patient was studied for 555.8 minutes. The sleep efficiency was 97.1 % and the patient was supine for 73.3%. The arousal index was 0.0 per hour.  RESPIRATORY PARAMETERS The overall AHI was 4.9 per hour, with a central apnea index of 0.0 per hour.  The oxygen nadir was 86% during sleep.  CARDIAC DATA Mean heart rate during sleep was 87.0 bpm.  IMPRESSIONS - Occasional obstructive sleep apnea occurred during this study, withinnormal limits (AHI = 4.9/h). - No significant central sleep apnea occurred during this study (CAI = 0.0/h). - Oxygen desaturation was noted during this study (Min O2 = 86%, Mean 93%). - Patient snored.  DIAGNOSIS - Normal study - Primary snoring  RECOMMENDATIONS - Be careful with alcohol, sedatives and other CNS depressants that may worsen sleep apnea and disrupt normal sleep architecture. - Sleep hygiene should be reviewed to assess factors that may improve sleep quality. - Weight management and regular exercise should be initiated or continued.  [Electronically signed] 08/26/2017 09:55 AM  Jetty Duhamellinton Young MD, ABSM Diplomate, American Board of Sleep Medicine   NPI: [id number]                          Jetty Duhamellinton Young Diplomate, American Board of Sleep Medicine  ELECTRONICALLY SIGNED ON:  08/26/2017, 9:52 AM [facility] PH: [phone]   FX: [phone] ACCREDITED BY THE AMERICAN ACADEMY OF SLEEP MEDICINE

## 2017-08-27 ENCOUNTER — Encounter: Payer: BLUE CROSS/BLUE SHIELD | Admitting: Internal Medicine

## 2017-09-03 ENCOUNTER — Telehealth: Payer: Self-pay | Admitting: Internal Medicine

## 2017-09-03 DIAGNOSIS — R002 Palpitations: Secondary | ICD-10-CM

## 2017-09-03 MED ORDER — METOPROLOL SUCCINATE ER 25 MG PO TB24: 25.0000 mg | ORAL_TABLET | Freq: Every day | ORAL | 0 refills | Status: DC

## 2017-09-03 NOTE — Telephone Encounter (Signed)
Went over results of cardiac monitor, no arrythmias, 38% of the time in sinus tach.  Spoke about increasing metoprolol to 25mg  daily starting today. Will reassess at visit on July 24

## 2017-09-21 ENCOUNTER — Ambulatory Visit: Payer: BLUE CROSS/BLUE SHIELD

## 2017-09-28 ENCOUNTER — Ambulatory Visit (INDEPENDENT_AMBULATORY_CARE_PROVIDER_SITE_OTHER): Payer: BLUE CROSS/BLUE SHIELD | Admitting: Internal Medicine

## 2017-09-28 ENCOUNTER — Encounter: Payer: Self-pay | Admitting: Internal Medicine

## 2017-09-28 DIAGNOSIS — I10 Essential (primary) hypertension: Secondary | ICD-10-CM

## 2017-09-28 DIAGNOSIS — R0683 Snoring: Secondary | ICD-10-CM | POA: Diagnosis not present

## 2017-09-28 DIAGNOSIS — Z79899 Other long term (current) drug therapy: Secondary | ICD-10-CM

## 2017-09-28 DIAGNOSIS — R002 Palpitations: Secondary | ICD-10-CM

## 2017-09-28 DIAGNOSIS — J45909 Unspecified asthma, uncomplicated: Secondary | ICD-10-CM | POA: Diagnosis not present

## 2017-09-28 NOTE — Progress Notes (Signed)
Internal Medicine Clinic Attending  I saw and evaluated the patient.  I personally confirmed the key portions of the history and exam documented by Dr. Agyei and I reviewed pertinent patient test results.  The assessment, diagnosis, and plan were formulated together and I agree with the documentation in the resident's note.  

## 2017-09-28 NOTE — Patient Instructions (Addendum)
Ms. Valerie Fletcher,  It was a pleasure taking care of you today at the clinic.  Your blood pressure is elevated and here are my recommendations:  I want you to take 1 and half tablets of metoprolol.  Please keep a log of your blood pressure measurements at home.  Also your sleep study was normal.  ~DR. Phillp Dolores

## 2017-09-28 NOTE — Assessment & Plan Note (Signed)
4 days ago she had an episode of tachycardia with no associated chest pain, shortness of breath, lightheadedness or dizziness.  Per note, she recently had a 30-day Holter monitor which was negative for dysrhythmias and only showed 38% sinus tachycardia.  She was initially on metoprolol 12.5 mg which was increased to 25 mg at her last visit.    Today the plan is to increase metoprolol to 37.5 mg.

## 2017-09-28 NOTE — Assessment & Plan Note (Signed)
Uncontrolled despite being on Toprol-XL 25 mg daily, Benicar 40 mg daily.  Denies chest pain, shortness of breath, headaches, lightheadedness.  Blood pressure today initially was 174/104 HR 82 with repeat reading of 150/94 HR72.    Plan is to increase metoprolol to 1.5 tablets (37.5mg ).  Also advised for her to keep BP log.

## 2017-09-28 NOTE — Progress Notes (Signed)
   CC: Tachycardia, sleep study results  HPI:  Valerie Fletcher is a 47 y.o. African-American female with past medical history of hypertension, asthma and rapid palpitations here for follow-up.  Hypertension: Uncontrolled despite being on Toprol-XL 25 mg daily, Benicar 40 mg daily.  Denies chest pain, shortness of breath, headaches, lightheadedness.  Blood pressure today initially was 174/104 with repeat reading of 150/94.  Plan is to increase metoprolol to 1.5 tablets (37.5mg ).  Also advised for her to keep BP log.  Rapid palpitation: 4 days ago she had episode of tachycardia with no associated chest pain, shortness of breath, lightheadedness or dizziness.  She had a Holter monitor which was negative for dysrhythmias and showed 38% sinus tachycardia.  She was initially on metoprolol 12.5 mg which was increased to 25 mg at her last visit.  Today the plan is to increase metoprolol to 37.5 mg.  Primary snoring: Discussed study results with patient.  Normal sleep study exam with primary snoring.  Past Medical History:  Diagnosis Date  . Asthma   . GERD (gastroesophageal reflux disease)   . Hypertension    Review of Systems: Per HPI  Physical Exam:  Vitals:   09/28/17 1437 09/28/17 1450  BP: (!) 170/104 (!) 150/92  Pulse: 82 72  Temp: 98.7 F (37.1 C)   TempSrc: Oral   SpO2: 100%   Weight: 164 lb 14.4 oz (74.8 kg)    Constitutional: In no distress Cardiovascular: RRR, no murmurs, gallops, rubs Respiratory: Clear to auscultation bilaterally, no wheezes, crackles, rhonchi Abdomen: Soft, nontender, nondistended to palpation  Assessment & Plan:   See Encounters Tab for problem based charting.  Patient discussed with Dr. Heide SparkNarendra

## 2017-10-04 ENCOUNTER — Encounter: Payer: Self-pay | Admitting: Internal Medicine

## 2017-10-05 ENCOUNTER — Encounter: Payer: Self-pay | Admitting: Internal Medicine

## 2017-10-05 ENCOUNTER — Other Ambulatory Visit (HOSPITAL_COMMUNITY)
Admission: RE | Admit: 2017-10-05 | Discharge: 2017-10-05 | Disposition: A | Discharge status: Home / Self Care | Payer: BLUE CROSS/BLUE SHIELD | Source: Ambulatory Visit | Attending: Internal Medicine | Admitting: Internal Medicine

## 2017-10-05 ENCOUNTER — Ambulatory Visit (INDEPENDENT_AMBULATORY_CARE_PROVIDER_SITE_OTHER): Payer: BLUE CROSS/BLUE SHIELD | Admitting: Internal Medicine

## 2017-10-05 ENCOUNTER — Other Ambulatory Visit: Payer: Self-pay

## 2017-10-05 VITALS — BP 124/101 | HR 101 | Temp 98.8°F | Ht 64.0 in | Wt 164.7 lb

## 2017-10-05 DIAGNOSIS — Z124 Encounter for screening for malignant neoplasm of cervix: Secondary | ICD-10-CM | POA: Insufficient documentation

## 2017-10-05 DIAGNOSIS — B373 Candidiasis of vulva and vagina: Secondary | ICD-10-CM | POA: Diagnosis not present

## 2017-10-05 DIAGNOSIS — R300 Dysuria: Secondary | ICD-10-CM

## 2017-10-05 DIAGNOSIS — B3731 Acute candidiasis of vulva and vagina: Secondary | ICD-10-CM

## 2017-10-05 DIAGNOSIS — Z1151 Encounter for screening for human papillomavirus (HPV): Secondary | ICD-10-CM | POA: Insufficient documentation

## 2017-10-05 DIAGNOSIS — Z8742 Personal history of other diseases of the female genital tract: Secondary | ICD-10-CM

## 2017-10-05 HISTORY — DX: Acute candidiasis of vulva and vagina: B37.31

## 2017-10-05 HISTORY — DX: Candidiasis of vulva and vagina: B37.3

## 2017-10-05 LAB — POCT URINALYSIS DIPSTICK
BILIRUBIN UA: NEGATIVE
Blood, UA: NEGATIVE
Glucose, UA: NEGATIVE
KETONES UA: NEGATIVE
Nitrite, UA: NEGATIVE
PH UA: 6 (ref 5.0–8.0)
Protein, UA: POSITIVE — AB
Spec Grav, UA: 1.025 (ref 1.010–1.025)
Urobilinogen, UA: 1 E.U./dL

## 2017-10-05 LAB — URINALYSIS, ROUTINE W REFLEX MICROSCOPIC
BACTERIA UA: NONE SEEN
Bilirubin Urine: NEGATIVE
Glucose, UA: NEGATIVE mg/dL
KETONES UR: NEGATIVE mg/dL
Nitrite: NEGATIVE
PROTEIN: NEGATIVE mg/dL
Specific Gravity, Urine: 1.02 (ref 1.005–1.030)
pH: 5 (ref 5.0–8.0)

## 2017-10-05 MED ORDER — FLUCONAZOLE 100 MG PO TABS: 150.0000 mg | ORAL_TABLET | Freq: Every day | ORAL | 0 refills | Status: AC

## 2017-10-05 NOTE — Patient Instructions (Signed)
Hi Ms. Valerie Fletcher,  It was a pleasure taking care of you at the clinic today.  Upon examination seem to have a yeast infection and I am going to treat you with Diflucan 150 mg.  You will take this medication today and Monday.  Dr. Dortha SchwalbeAgyei

## 2017-10-05 NOTE — Assessment & Plan Note (Signed)
Vaginal candidiasis: She started experiencing burning with urination 4 days ago and also reported simultaneous urinary frequency and urinary urgency.  States that she took over-the-counter Azo on Monday and Tuesday with no relief.  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 Denies fevers or chills. On examination, she had suprapubic tenderness -Urine dipstick positive for protein and small -Urinalysis pending -Pelvic exam with speculum with scant thick white discharge -Pap smear with HPV cotesting pending -Wet mount pending -Given a course of Diflucan 150 mg (day 1 and day 3)

## 2017-10-05 NOTE — Progress Notes (Signed)
   CC: Dysuria   HPI:  [name] is a 47 y.o. with past medical history as stated below presenting with dysuria.  Vaginal candidiasis: She started experiencing burning with urination 4 days ago and also reported simultaneous urinary frequency and urinary urgency.  States that she took over-the-counter Azo on Monday and Tuesday with no relief.  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 Denies fevers or chills. On examination, she had suprapubic tenderness -Urine dipstick positive for protein and small -Urinalysis pending -Pelvic exam with speculum with scant thick white discharge -Pap smear with HPV cotesting pending -Wet mount pending -Given a course of Diflucan 150 mg (day 1 and day 3)  Past Medical History:  Diagnosis Date  . Asthma   . GERD (gastroesophageal reflux disease)   . Hypertension    Review of Systems:  As per HPI  Vitals:   10/05/17 1555  BP: (!) 124/101  Pulse: (!) 101  Temp: 98.8 F (37.1 C)  TempSrc: Oral  SpO2: 100%  Weight: 164 lb 11.2 oz (74.7 kg)  Height: 5\' 4"  (1.626 m)   .Physical Exam  Constitutional: She appears well-developed and well-nourished.  HENT:  Head: Normocephalic and atraumatic.  Cardiovascular: Normal rate, regular rhythm and normal heart sounds.  Pulmonary/Chest: Breath sounds normal.  Abdominal: Soft. Bowel sounds are normal. There is tenderness (Suprapubic mildly tender to palpation ).  Genitourinary: Cervix exhibits discharge (thick white discharge ). Cervix exhibits no motion tenderness and no friability. No tenderness in the vagina. Vaginal discharge (Thick white discharge) found.    Assessment & Plan:   See Encounters Tab for problem based charting.  Patient discussed with Dr. Criselda PeachesMullen

## 2017-10-08 LAB — CERVICOVAGINAL ANCILLARY ONLY
Bacterial vaginitis: NEGATIVE
CANDIDA VAGINITIS: POSITIVE — AB
Trichomonas: NEGATIVE

## 2017-10-08 NOTE — Progress Notes (Signed)
Internal Medicine Clinic Attending  I saw and evaluated the patient.  I personally confirmed the key portions of the history and exam documented by Dr. Agyei and I reviewed pertinent patient test results.  The assessment, diagnosis, and plan were formulated together and I agree with the documentation in the resident's note.  

## 2017-10-09 ENCOUNTER — Encounter: Payer: Self-pay | Admitting: Internal Medicine

## 2017-10-09 ENCOUNTER — Telehealth: Payer: Self-pay | Admitting: Internal Medicine

## 2017-10-09 LAB — CYTOLOGY - PAP
DIAGNOSIS: NEGATIVE
HPV: DETECTED — AB

## 2017-10-09 NOTE — Telephone Encounter (Signed)
I called patient but unfortunately unable to reach her. I will try calling again or sending a letter. Her wet mount was positive for candida, however, she has already been prescribed 2 doses of Diflucan 150mg .

## 2017-10-09 NOTE — Progress Notes (Addendum)
I am sending a letter to the patient as I was unable to reach her. Wet mount positive for candida. She has already been prescribed 2 doses of Diflucan 150mg .   Pap smear is negative for intraepithelial lesions or malignancy however HPV positive.  Will have a repeat Pap smear with cotesting in 1 year and have already placed a future order.

## 2017-10-10 ENCOUNTER — Other Ambulatory Visit: Payer: Self-pay | Admitting: Internal Medicine

## 2017-10-10 DIAGNOSIS — R87618 Other abnormal cytological findings on specimens from cervix uteri: Secondary | ICD-10-CM

## 2017-10-10 DIAGNOSIS — R8789 Other abnormal findings in specimens from female genital organs: Secondary | ICD-10-CM

## 2017-10-10 DIAGNOSIS — Z0000 Encounter for general adult medical examination without abnormal findings: Secondary | ICD-10-CM

## 2017-10-10 NOTE — Progress Notes (Signed)
I have reviewed the patient's labs.  Her Pap smear showed negative for intraepithelial lesions or malignancy with positive HPV.  Placed a future order to have a repeat Pap smear with HPV cotesting in 1 year.  I will send a letter to the patient regarding this

## 2017-10-24 ENCOUNTER — Other Ambulatory Visit: Payer: Self-pay | Admitting: Internal Medicine

## 2017-10-24 ENCOUNTER — Encounter: Payer: Self-pay | Admitting: Internal Medicine

## 2017-10-24 DIAGNOSIS — R002 Palpitations: Secondary | ICD-10-CM

## 2017-10-24 MED ORDER — METOPROLOL SUCCINATE ER 25 MG PO TB24: 25.0000 mg | ORAL_TABLET | Freq: Every day | ORAL | 0 refills | Status: DC

## 2017-10-26 ENCOUNTER — Other Ambulatory Visit: Payer: Self-pay | Admitting: *Deleted

## 2017-10-26 DIAGNOSIS — R002 Palpitations: Secondary | ICD-10-CM

## 2017-10-26 NOTE — Telephone Encounter (Signed)
Fax from [facility] states pt is expecting a dose increase.  Per 8/2 note  "Plan is to increase metoprolol to 1.5 tablets (37.5mg ).  Also advised for her to keep BP log."  Please send new rx. Thanks

## 2017-10-29 ENCOUNTER — Other Ambulatory Visit: Payer: Self-pay | Admitting: Internal Medicine

## 2017-10-29 DIAGNOSIS — R002 Palpitations: Secondary | ICD-10-CM

## 2017-10-29 MED ORDER — METOPROLOL SUCCINATE ER 25 MG PO TB24: 37.5000 mg | ORAL_TABLET | Freq: Every day | ORAL | 0 refills | Status: DC

## 2017-10-29 NOTE — Progress Notes (Signed)
I called patient to clarify the message she sent earlier but was unable to reach her. We did discuss an increase in Metoprolol to 37.5mg  daily and I have sent in a new prescription.

## 2017-10-31 ENCOUNTER — Encounter: Payer: Self-pay | Admitting: Internal Medicine

## 2017-11-06 ENCOUNTER — Other Ambulatory Visit: Payer: Self-pay | Admitting: Internal Medicine

## 2017-11-06 MED ORDER — TIOCONAZOLE 6.5 % VA OINT: 1.0000 | TOPICAL_OINTMENT | Freq: Once | VAGINAL | 0 refills | Status: AC

## 2017-11-06 NOTE — Progress Notes (Signed)
I received a message from patient regarding recurrence of symptoms related to previous vaginal candidiasis despite oral diflucan use.   I have written a prescription for Vagistat vaginal ointment to be used for 7-10 days and re-evaluate symptoms.

## 2017-12-22 ENCOUNTER — Other Ambulatory Visit: Payer: Self-pay | Admitting: Internal Medicine

## 2017-12-22 DIAGNOSIS — R002 Palpitations: Secondary | ICD-10-CM

## 2017-12-24 NOTE — Telephone Encounter (Signed)
Refilled.  Also please see if patient could get a yearly check up scheduled

## 2017-12-26 NOTE — Telephone Encounter (Signed)
Pt appt 01/28/2018 @ 1:45.

## 2018-01-21 ENCOUNTER — Other Ambulatory Visit: Payer: Self-pay

## 2018-01-21 NOTE — Telephone Encounter (Signed)
Called pharm, confirmed refills at pharm, they will fill and notify pt

## 2018-01-21 NOTE — Telephone Encounter (Signed)
metoprolol succinate (TOPROL-XL) 25 MG 24 hr tablet, refill request @ [id number] - [place], [place] - [address]

## 2018-01-28 ENCOUNTER — Encounter: Payer: BLUE CROSS/BLUE SHIELD | Admitting: Internal Medicine

## 2018-01-31 ENCOUNTER — Other Ambulatory Visit: Payer: Self-pay | Admitting: *Deleted

## 2018-01-31 NOTE — Telephone Encounter (Signed)
Next appt scheduled 04/08/18 with PCP.

## 2018-02-01 MED ORDER — PANTOPRAZOLE SODIUM 40 MG PO TBEC: 40.0000 mg | DELAYED_RELEASE_TABLET | Freq: Every day | ORAL | 3 refills | Status: DC

## 2018-02-01 NOTE — Telephone Encounter (Signed)
refilled 

## 2018-02-05 ENCOUNTER — Encounter: Payer: Self-pay | Admitting: Internal Medicine

## 2018-04-08 ENCOUNTER — Other Ambulatory Visit: Payer: Self-pay

## 2018-04-08 ENCOUNTER — Encounter: Payer: Self-pay | Admitting: Internal Medicine

## 2018-04-08 ENCOUNTER — Ambulatory Visit (INDEPENDENT_AMBULATORY_CARE_PROVIDER_SITE_OTHER): Payer: BLUE CROSS/BLUE SHIELD | Admitting: Internal Medicine

## 2018-04-08 VITALS — BP 153/96 | HR 82 | Temp 98.3°F | Ht 64.0 in | Wt 169.9 lb

## 2018-04-08 DIAGNOSIS — R002 Palpitations: Secondary | ICD-10-CM

## 2018-04-08 DIAGNOSIS — D509 Iron deficiency anemia, unspecified: Secondary | ICD-10-CM

## 2018-04-08 DIAGNOSIS — E785 Hyperlipidemia, unspecified: Secondary | ICD-10-CM

## 2018-04-08 DIAGNOSIS — I10 Essential (primary) hypertension: Secondary | ICD-10-CM

## 2018-04-08 DIAGNOSIS — Z23 Encounter for immunization: Secondary | ICD-10-CM

## 2018-04-08 DIAGNOSIS — R7303 Prediabetes: Secondary | ICD-10-CM

## 2018-04-08 HISTORY — DX: Iron deficiency anemia, unspecified: D50.9

## 2018-04-08 MED ORDER — AMLODIPINE BESYLATE 10 MG PO TABS: 10.0000 mg | ORAL_TABLET | Freq: Every day | ORAL | 1 refills | Status: DC

## 2018-04-08 NOTE — Patient Instructions (Signed)
Valerie Fletcher, we will check your iron levels, cholesterol and your blood counts/renal function today.  I will let you know the results.  For your high blood pressure we have added amlodipine.  Please follow up in one month.

## 2018-04-08 NOTE — Assessment & Plan Note (Signed)
Patient with history of prediabetes.  She denies any symptoms currently associated with diabetes.  For now we have been continuing dietary and lifestyle modifications.    -I will check an A1c today to make sure we are going in the right direction

## 2018-04-08 NOTE — Assessment & Plan Note (Signed)
Patient started on Crestor 20 mg for severely high LDL at 185.  She reports good compliance with Crestor takes it every night no myopathy or other symptoms.    -We will check a lipid panel today adjust therapy if needed

## 2018-04-08 NOTE — Progress Notes (Signed)
Internal Medicine Clinic Attending  Case discussed with Dr. Winfrey  at the time of the visit.  We reviewed the resident's history and exam and pertinent patient test results.  I agree with the assessment, diagnosis, and plan of care documented in the resident's note.  

## 2018-04-08 NOTE — Assessment & Plan Note (Signed)
HTN: currently on metoprolol xl 50mg  daily and olmesartan 40mg .  Metoprolol increased last visit to help with palpitations.  She has side effects with ACE inhibitors, chlorthalidone, HCTZ.  She reports no side effects with amlodipine she has been on it in the past when she lived in [place].  She says it was discontinued due to lack of efficacy.    -Discussed with the patient that since she is on 2 other medications the amlodipine may work better and synergistically, she agreed and we started her on amlodipine 10 mg daily.

## 2018-04-08 NOTE — Assessment & Plan Note (Signed)
Patient's palpitations have improved dramatically with her metoprolol therapy.  She reports she is much more comfortable now and does not have to worry about feeling these palpitations. At the time of diagnosis her rhythm was slightly irregular, her EKG showed sinus arrhythmia she was placed on a Holter monitor for a month which proved it was just sinus rhythm with occasional sinus arrhythmia.  She did have an iron deficiency anemia at the time but it is chronic for her and she says she never had problems with the palpitations before.  Also they seem to occur sporadically.  She also underwent a sleep study that showed no sleep apnea since on many occasions she would have these episodes during the night.    -We will check iron studies and CBC to make sure anemia has not progressed -Continue metoprolol for symptomatic relief and blood pressure control

## 2018-04-08 NOTE — Progress Notes (Signed)
   CC: HTN, HLD, Palpitations, Prediabetes, Iron Deficiency Anemia  HPI:  [name] is a 48 y.o. female with PMH below.  Today we will address HTN, HLD, Palpitations, Prediabetes, Iron Deficiency Anemia.  Please see A&P for status of the patient's chronic medical conditions  Past Medical History:  Diagnosis Date  . Asthma   . GERD (gastroesophageal reflux disease)   . Hypertension    Review of Systems:  ROS: Pulmonary: pt denies increased work of breathing, shortness of breath,  Cardiac: pt denies palpitations, chest pain,  Abdominal: pt denies abdominal pain, nausea, vomiting, or diarrhea   Physical Exam:  Vitals:   04/08/18 1424  BP: (!) 153/96  Pulse: 82  Temp: 98.3 F (36.8 C)  TempSrc: Oral  SpO2: 100%  Weight: 169 lb 14.4 oz (77.1 kg)  Height: 5\' 4"  (1.626 m)   Cardiac: normal rate and rhythm, clear s1 and s2, no murmurs, rubs or gallops Pulmonary: CTAB, not in distress Abdominal: non distended abdomen, soft and nontender Extremities: no LE edema Psych: Alert, conversant, in good spirits   Social History   Socioeconomic History  . Marital status: Single    Spouse name: Not on file  . Number of children: 4  . Years of education: Not on file  . Highest education level: Associate degree: academic program  Occupational History  . Not on file  Social Needs  . Financial resource strain: Not on file  . Food insecurity:    Worry: Not on file    Inability: Not on file  . Transportation needs:    Medical: Not on file    Non-medical: Not on file  Tobacco Use  . Smoking status: Never Smoker  . Smokeless tobacco: Never Used  Substance and Sexual Activity  . Alcohol use: Yes    Alcohol/week: 1.0 standard drinks    Types: 1 Glasses of wine per week  . Drug use: No  . Sexual activity: Yes    Partners: Male    Birth control/protection: Condom    Comment: s/p tubal ligation  Lifestyle  . Physical activity:    Days per week: Not on file    Minutes  per session: Not on file  . Stress: Not on file  Relationships  . Social connections:    Talks on phone: Not on file    Gets together: Not on file    Attends religious service: Not on file    Active member of club or organization: Not on file    Attends meetings of clubs or organizations: Not on file    Relationship status: Not on file  . Intimate partner violence:    Fear of current or ex partner: Not on file    Emotionally abused: Not on file    Physically abused: Not on file    Forced sexual activity: Not on file  Other Topics Concern  . Not on file  Social History Narrative  . Not on file    Family History  Problem Relation Age of Onset  . Hypertension Mother   . Hypertension Father   . Hypertension Brother   . Bladder Cancer Maternal Grandmother 68       Passed away from disease 28-Jun-2005  . Diabetes Maternal Grandmother   . Pancreatic cancer Paternal Grandmother   . Diabetes Paternal Grandmother     Assessment & Plan:   See Encounters Tab for problem based charting.  Patient discussed with Dr. Rogelia Boga

## 2018-04-08 NOTE — Assessment & Plan Note (Signed)
She reports chronic iron deficiency anemia, her hemoglobin stays around 10.  She used to be on oral iron supplementation but she decided to discontinue.  She reports that her periods have been on the heavier side her whole life and this is been a longstanding issue.  She agreed to checking her iron levels again today  and if they are still low she will willing to discuss starting back iron supplementation.  -Ferritin, iron TIBC

## 2018-04-09 LAB — LIPID PANEL
CHOLESTEROL TOTAL: 146 mg/dL (ref 100–199)
Chol/HDL Ratio: 3.4 ratio (ref 0.0–4.4)
HDL: 43 mg/dL (ref >39)
LDL Calculated: 63 mg/dL (ref 0–99)
TRIGLYCERIDES: 202 mg/dL — AB (ref 0–149)
VLDL Cholesterol Cal: 40 mg/dL (ref 5–40)

## 2018-04-09 LAB — BMP8+ANION GAP
ANION GAP: 19 mmol/L — AB (ref 10.0–18.0)
BUN / CREAT RATIO: 18 (ref 9–23)
BUN: 18 mg/dL (ref 6–24)
CO2: 18 mmol/L — AB (ref 20–29)
CREATININE: 1.02 mg/dL — AB (ref 0.57–1.00)
Calcium: 9.2 mg/dL (ref 8.7–10.2)
Chloride: 100 mmol/L (ref 96–106)
GFR calc Af Amer: 75 mL/min/{1.73_m2} (ref >59)
GFR calc non Af Amer: 65 mL/min/{1.73_m2} (ref >59)
GLUCOSE: 102 mg/dL — AB (ref 65–99)
POTASSIUM: 4.3 mmol/L (ref 3.5–5.2)
SODIUM: 137 mmol/L (ref 134–144)

## 2018-04-09 LAB — CBC
HEMATOCRIT: 35.1 % (ref 34.0–46.6)
HEMOGLOBIN: 11.1 g/dL (ref 11.1–15.9)
MCH: 27.3 pg (ref 26.6–33.0)
MCHC: 31.6 g/dL (ref 31.5–35.7)
MCV: 87 fL (ref 79–97)
Platelets: 244 10*3/uL (ref 150–450)
RBC: 4.06 x10E6/uL (ref 3.77–5.28)
RDW: 13.9 % (ref 11.7–15.4)
WBC: 6.6 10*3/uL (ref 3.4–10.8)

## 2018-04-09 LAB — IRON AND TIBC
IRON SATURATION: 18 % (ref 15–55)
Iron: 79 ug/dL (ref 27–159)
Total Iron Binding Capacity: 431 ug/dL (ref 250–450)
UIBC: 352 ug/dL (ref 131–425)

## 2018-04-09 LAB — HEMOGLOBIN A1C
ESTIMATED AVERAGE GLUCOSE: 126 mg/dL
Hgb A1c MFr Bld: 6 % — ABNORMAL HIGH (ref 4.8–5.6)

## 2018-04-09 LAB — FERRITIN: Ferritin: 23 ng/mL (ref 15–150)

## 2018-04-19 ENCOUNTER — Telehealth: Payer: Self-pay | Admitting: Internal Medicine

## 2018-04-19 MED ORDER — FERROUS GLUCONATE 324 (38 FE) MG PO TABS: 324.0000 mg | ORAL_TABLET | Freq: Every day | ORAL | 3 refills | Status: DC

## 2018-04-19 NOTE — Telephone Encounter (Signed)
Spoke with pt about test results and starting her back on oral iron therapy

## 2018-04-29 ENCOUNTER — Other Ambulatory Visit: Payer: Self-pay | Admitting: Internal Medicine

## 2018-04-29 DIAGNOSIS — I10 Essential (primary) hypertension: Secondary | ICD-10-CM

## 2018-04-30 ENCOUNTER — Ambulatory Visit: Payer: BLUE CROSS/BLUE SHIELD

## 2018-04-30 ENCOUNTER — Other Ambulatory Visit: Payer: Self-pay

## 2018-04-30 ENCOUNTER — Ambulatory Visit (INDEPENDENT_AMBULATORY_CARE_PROVIDER_SITE_OTHER): Payer: BLUE CROSS/BLUE SHIELD | Admitting: Internal Medicine

## 2018-04-30 VITALS — BP 151/90 | HR 87 | Temp 98.2°F | Ht 64.0 in | Wt 169.4 lb

## 2018-04-30 DIAGNOSIS — J45909 Unspecified asthma, uncomplicated: Secondary | ICD-10-CM

## 2018-04-30 DIAGNOSIS — J069 Acute upper respiratory infection, unspecified: Secondary | ICD-10-CM

## 2018-04-30 DIAGNOSIS — Z79899 Other long term (current) drug therapy: Secondary | ICD-10-CM | POA: Diagnosis not present

## 2018-04-30 MED ORDER — DEXTROMETHORPHAN-GUAIFENESIN 10-100 MG/5ML PO SYRP: 5.0000 mL | ORAL_SOLUTION | Freq: Two times a day (BID) | ORAL | 0 refills | Status: DC

## 2018-04-30 MED ORDER — BENZONATATE 100 MG PO CAPS: 100.0000 mg | ORAL_CAPSULE | Freq: Three times a day (TID) | ORAL | 0 refills | Status: DC | PRN

## 2018-04-30 MED ORDER — SALINE SPRAY 0.65 % NA SOLN: 1.0000 | NASAL | 0 refills | Status: DC | PRN

## 2018-04-30 NOTE — Progress Notes (Deleted)
   CC: ***  HPI:  Ms.Graceanne Gura is a 48 y.o.   Past Medical History:  Diagnosis Date  . Asthma   . GERD (gastroesophageal reflux disease)   . Hypertension     Review of Systems:  ***  Physical Exam:  Vitals:   04/30/18 1621  BP: (!) 151/90  Pulse: 87  Temp: 98.2 F (36.8 C)  TempSrc: Oral  SpO2: 100%  Weight: 169 lb 6.4 oz (76.8 kg)  Height: 5\' 4"  (1.626 m)   ***  Assessment & Plan:   See encounters tab for problem based medical decision making.   Patient {GC/GE:[id number]::"discussed with","seen with"} Dr. {NAMES:[id number]::"Butcher","Granfortuna","E. Dak Szumski","Klima","Mullen","Narendra","Vincent"}

## 2018-04-30 NOTE — Progress Notes (Signed)
   CC: cough  HPI:  [name] is a 47 y.o. female with history noted below the presents to the acute care clinic for follow-up of cough. Please see problem based charting for the status of patient's chronic medical conditions.  Past Medical History:  Diagnosis Date  . Asthma   . GERD (gastroesophageal reflux disease)   . Hypertension     Review of Systems:  Review of Systems  Constitutional: Negative for chills and fever.  HENT: Positive for congestion. Negative for sinus pain.   Respiratory: Positive for cough and sputum production. Negative for shortness of breath and wheezing.      Physical Exam:  Vitals:   04/30/18 1621  BP: (!) 151/90  Pulse: 87  Temp: 98.2 F (36.8 C)  TempSrc: Oral  SpO2: 100%  Weight: 169 lb 6.4 oz (76.8 kg)  Height: 5\' 4"  (1.626 m)   Physical Exam  Constitutional: She is well-developed, well-nourished, and in no distress.  Cardiovascular: Normal rate, regular rhythm and normal heart sounds. Exam reveals no gallop and no friction rub.  No murmur heard. Pulmonary/Chest: Effort normal and breath sounds normal. No respiratory distress. She has no wheezes. She has no rales.  Skin: Skin is warm and dry.     Assessment & Plan:   See encounters tab for problem based medical decision making.   Patient discussed with Dr. Heide Fletcher

## 2018-04-30 NOTE — Patient Instructions (Addendum)
Valerie Fletcher,  It was a pleasure meeting you today.  Please start taking Tessalon Perles, Robitussin and use your nasal spray to help with symptoms. If your symptoms worsen please call and return to the clinic.

## 2018-05-01 DIAGNOSIS — J069 Acute upper respiratory infection, unspecified: Secondary | ICD-10-CM | POA: Insufficient documentation

## 2018-05-01 HISTORY — DX: Acute upper respiratory infection, unspecified: J06.9

## 2018-05-01 NOTE — Assessment & Plan Note (Signed)
History of present illness: Patient presents with a two-week history of cough with productive sputum.  She states she has noticed wheezing in the beginning of her illness but is not currently experiencing wheezing or shortness of breath.  She has a history of asthma and uses an albuterol inhaler as needed. She states she is taking it once a day benefit to symptoms.  She denies fever/chills  Assessment: URI Patient likely has a viral upper respiratory infection. She appears well on exam. She satting 100% on room air. Lungs are clear with no wheezing on exam. At this time will treat with conservative measures. Told patient that if her symptoms worsened that she would need to call the clinic or be seen as she may require antibiotics and/or steroids.    Plan -Tessalon Perles, Robitussin and nasal spray

## 2018-05-02 NOTE — Progress Notes (Signed)
Internal Medicine Clinic Attending  Case discussed with Dr. Hoffman at the time of the visit.  We reviewed the resident's history and exam and pertinent patient test results.  I agree with the assessment, diagnosis, and plan of care documented in the resident's note.  

## 2018-05-20 ENCOUNTER — Encounter: Payer: BLUE CROSS/BLUE SHIELD | Admitting: Internal Medicine

## 2018-05-27 ENCOUNTER — Encounter: Payer: Self-pay | Admitting: Internal Medicine

## 2018-07-21 ENCOUNTER — Other Ambulatory Visit: Payer: Self-pay | Admitting: Internal Medicine

## 2018-07-21 DIAGNOSIS — E785 Hyperlipidemia, unspecified: Secondary | ICD-10-CM

## 2018-07-24 NOTE — Telephone Encounter (Signed)
refilled 

## 2018-09-27 ENCOUNTER — Other Ambulatory Visit: Payer: Self-pay | Admitting: *Deleted

## 2018-09-27 ENCOUNTER — Other Ambulatory Visit: Payer: Self-pay | Admitting: Internal Medicine

## 2018-09-27 ENCOUNTER — Encounter: Payer: Self-pay | Admitting: Internal Medicine

## 2018-09-27 DIAGNOSIS — R002 Palpitations: Secondary | ICD-10-CM

## 2018-09-27 NOTE — Telephone Encounter (Signed)
Pt has refills at pharmacy on metoprolol and ferrous gluconate

## 2018-09-27 NOTE — Telephone Encounter (Signed)
refilled 

## 2018-09-29 NOTE — Progress Notes (Signed)
   CC: Iron deficiency anemia, HTN  HPI:  [name] is a 48 y.o. female with PMH below.  Today we will address Iron deficiency anemia, HTN   Please see A&P for status of the patient's chronic medical conditions  Past Medical History:  Diagnosis Date  . Asthma   . GERD (gastroesophageal reflux disease)   . Hypertension    Review of Systems:  ROS: Pulmonary: pt denies increased work of breathing, shortness of breath,  Cardiac: pt denies palpitations, chest pain,  Abdominal: pt denies abdominal pain, nausea, vomiting, or diarrhea   Physical Exam:  Vitals:   09/30/18 1523 09/30/18 1525  BP:  (!) 172/110  Pulse:  91  Temp:  98.2 F (36.8 C)  TempSrc:  Oral  SpO2:  99%  Weight: 178 lb 8 oz (81 kg)   Height: 5\' 4"  (1.626 m)    Cardiac: JVD flat, normal rate and rhythm, clear s1 and s2, no murmurs, rubs or gallops, trace LE edema Pulmonary: CTAB, not in distress Abdominal: non distended abdomen, soft and nontender Psych: Alert, conversant, in good spirits    Social History   Socioeconomic History  . Marital status: Single    Spouse name: Not on file  . Number of children: 4  . Years of education: Not on file  . Highest education level: Associate degree: academic program  Occupational History  . Not on file  Social Needs  . Financial resource strain: Not on file  . Food insecurity    Worry: Not on file    Inability: Not on file  . Transportation needs    Medical: Not on file    Non-medical: Not on file  Tobacco Use  . Smoking status: Never Smoker  . Smokeless tobacco: Never Used  Substance and Sexual Activity  . Alcohol use: Yes    Alcohol/week: 1.0 standard drinks    Types: 1 Glasses of wine per week  . Drug use: No  . Sexual activity: Yes    Partners: Male    Birth control/protection: Condom    Comment: s/p tubal ligation  Lifestyle  . Physical activity    Days per week: Not on file    Minutes per session: Not on file  . Stress: Not on file   Relationships  . Social Herbalist on phone: Not on file    Gets together: Not on file    Attends religious service: Not on file    Active member of club or organization: Not on file    Attends meetings of clubs or organizations: Not on file    Relationship status: Not on file  . Intimate partner violence    Fear of current or ex partner: Not on file    Emotionally abused: Not on file    Physically abused: Not on file    Forced sexual activity: Not on file  Other Topics Concern  . Not on file  Social History Narrative  . Not on file    Family History  Problem Relation Age of Onset  . Hypertension Mother   . Hypertension Father   . Hypertension Brother   . Bladder Cancer Maternal Grandmother 29       Passed away from disease 06-26-2005  . Diabetes Maternal Grandmother   . Pancreatic cancer Paternal Grandmother   . Diabetes Paternal Grandmother     Assessment & Plan:   See Encounters Tab for problem based charting.  Patient discussed with Dr. Rebeca Alert

## 2018-09-30 ENCOUNTER — Other Ambulatory Visit: Payer: Self-pay

## 2018-09-30 ENCOUNTER — Encounter: Payer: Self-pay | Admitting: Internal Medicine

## 2018-09-30 ENCOUNTER — Ambulatory Visit (INDEPENDENT_AMBULATORY_CARE_PROVIDER_SITE_OTHER): Payer: BC Managed Care – PPO | Admitting: Internal Medicine

## 2018-09-30 VITALS — BP 172/110 | HR 91 | Temp 98.2°F | Ht 64.0 in | Wt 178.5 lb

## 2018-09-30 DIAGNOSIS — I10 Essential (primary) hypertension: Secondary | ICD-10-CM | POA: Diagnosis not present

## 2018-09-30 DIAGNOSIS — D500 Iron deficiency anemia secondary to blood loss (chronic): Secondary | ICD-10-CM | POA: Diagnosis not present

## 2018-09-30 DIAGNOSIS — Z79899 Other long term (current) drug therapy: Secondary | ICD-10-CM | POA: Diagnosis not present

## 2018-09-30 DIAGNOSIS — D509 Iron deficiency anemia, unspecified: Secondary | ICD-10-CM | POA: Diagnosis not present

## 2018-09-30 MED ORDER — FERROUS GLUCONATE 324 (38 FE) MG PO TABS: 324.0000 mg | ORAL_TABLET | Freq: Every day | ORAL | 0 refills | Status: DC

## 2018-09-30 MED ORDER — OLMESARTAN MEDOXOMIL 40 MG PO TABS: 40.0000 mg | ORAL_TABLET | Freq: Every day | ORAL | 3 refills | Status: DC

## 2018-09-30 MED ORDER — CARVEDILOL 12.5 MG PO TABS: ORAL_TABLET | ORAL | 0 refills | Status: DC

## 2018-09-30 NOTE — Patient Instructions (Signed)
Valerie Fletcher, we will switch you to a blood pressure medication called carvedilol. You will stop taking metoprolol.  We will have you follow up in about 6 weeks for a bp check.  I will check your iron levels today.  If they have improved we can stop the iron supplementation.

## 2018-10-01 LAB — CBC
Hematocrit: 33.1 % — ABNORMAL LOW (ref 34.0–46.6)
Hemoglobin: 11.3 g/dL (ref 11.1–15.9)
MCH: 29.6 pg (ref 26.6–33.0)
MCHC: 34.1 g/dL (ref 31.5–35.7)
MCV: 87 fL (ref 79–97)
Platelets: 228 10*3/uL (ref 150–450)
RBC: 3.82 x10E6/uL (ref 3.77–5.28)
RDW: 12.6 % (ref 11.7–15.4)
WBC: 7 10*3/uL (ref 3.4–10.8)

## 2018-10-01 LAB — IRON AND TIBC
Iron Saturation: 23 % (ref 15–55)
Iron: 81 ug/dL (ref 27–159)
Total Iron Binding Capacity: 351 ug/dL (ref 250–450)
UIBC: 270 ug/dL (ref 131–425)

## 2018-10-01 LAB — FERRITIN: Ferritin: 174 ng/mL — ABNORMAL HIGH (ref 15–150)

## 2018-10-03 ENCOUNTER — Other Ambulatory Visit: Payer: Self-pay

## 2018-10-03 DIAGNOSIS — Z20822 Contact with and (suspected) exposure to covid-19: Secondary | ICD-10-CM

## 2018-10-04 NOTE — Assessment & Plan Note (Addendum)
HTN: pt is on metoprolol succinate 50mg  daily has been out since Friday, olmesartan 40mg , amlodipine 10mg  daily.  She self-discontinued the amlodipine because she felt tired with it, no energy and said it improved when she discontinued. Starting to run out of options due to multiple intolerances.  Hypertensive today but not on metoprolol or amlodipine.  K on the higher side of normal so likely wouldn't tolerate spiro.  She is not willing to try amlodipine at a lower dose.  -switch metoprolol to carvedilol and will ramp up to 25mg  BID -continue olmesartan 40

## 2018-10-04 NOTE — Progress Notes (Signed)
Internal Medicine Clinic Attending  Case discussed with Dr. Winfrey at the time of the visit.  We reviewed the resident's history and exam and pertinent patient test results.  I agree with the assessment, diagnosis, and plan of care documented in the resident's note.  Alexander Raines, M.D., Ph.D.  

## 2018-10-04 NOTE — Assessment & Plan Note (Signed)
Iron deficiency anemia: 2/2 menstrual blood loss.  Started her on iron supplementation around 5 months ago.  Last NMP 2 months ago feels she may be going into menopause. I expect her iron will be improved and now that she is in menopause we may be able to discontinue and check less frequently.      -Check cbc, iron/tibc, ferritin

## 2018-10-05 LAB — NOVEL CORONAVIRUS, NAA: SARS-CoV-2, NAA: NOT DETECTED

## 2018-10-05 LAB — SPECIMEN STATUS REPORT

## 2018-10-07 ENCOUNTER — Telehealth: Payer: Self-pay | Admitting: Internal Medicine

## 2018-10-07 NOTE — Telephone Encounter (Signed)
Spoke with pt about her iron stores which are now replete she can discontinue oral iron therapy as she has recently went through menopause.

## 2018-10-25 ENCOUNTER — Other Ambulatory Visit: Payer: Self-pay | Admitting: Internal Medicine

## 2018-10-25 DIAGNOSIS — I10 Essential (primary) hypertension: Secondary | ICD-10-CM

## 2018-10-28 NOTE — Telephone Encounter (Signed)
refilled 

## 2018-11-11 ENCOUNTER — Ambulatory Visit (INDEPENDENT_AMBULATORY_CARE_PROVIDER_SITE_OTHER): Payer: BC Managed Care – PPO | Admitting: Internal Medicine

## 2018-11-11 ENCOUNTER — Encounter: Payer: Self-pay | Admitting: Internal Medicine

## 2018-11-11 ENCOUNTER — Other Ambulatory Visit: Payer: Self-pay

## 2018-11-11 VITALS — BP 161/102 | HR 68 | Temp 98.2°F | Wt 178.4 lb

## 2018-11-11 DIAGNOSIS — Z79899 Other long term (current) drug therapy: Secondary | ICD-10-CM | POA: Diagnosis not present

## 2018-11-11 DIAGNOSIS — Z23 Encounter for immunization: Secondary | ICD-10-CM

## 2018-11-11 DIAGNOSIS — I10 Essential (primary) hypertension: Secondary | ICD-10-CM

## 2018-11-11 MED ORDER — AMLODIPINE BESYLATE 10 MG PO TABS: 10.0000 mg | ORAL_TABLET | Freq: Every day | ORAL | 1 refills | Status: DC

## 2018-11-11 NOTE — Assessment & Plan Note (Signed)
BP Readings from Last 3 Encounters:  11/11/18 (!) 161/102  09/30/18 (!) 172/110  04/30/18 (!) 151/90   Last visit only taking olmesartan she was out of metoprolol and self discontinued amlodipine due to feeling "tired".   Switched her to carvedilol with ramp up to 25mg  BID.  Took her medicine at 6am this morning except olmesartan 40mg  which she takes at night.  She ramped up the carvedilol as directed.  As I expected bp still elevated today.  She is willing to try the amlodipine again if she can take it at night which I am fine with.    -continue olmesartan, carvedilol  -add back amlodipine 10mg  qhs

## 2018-11-11 NOTE — Progress Notes (Signed)
   CC: HTN follow up  HPI:  [name] is a 48 y.o. female with PMH below.  Today we will address HTN   Please see A&P for status of the patient's chronic medical conditions  Past Medical History:  Diagnosis Date  . Asthma   . GERD (gastroesophageal reflux disease)   . Hypertension    Review of Systems:  ROS: Pulmonary: pt denies increased work of breathing, shortness of breath,  Cardiac: pt denies palpitations, chest pain,  Abdominal: pt denies abdominal pain, nausea, vomiting, or diarrhea   Physical Exam:  Vitals:   11/11/18 1551 11/11/18 1608  BP: (!) 183/109 (!) 161/102  Pulse: 78 68  Temp: 98.2 F (36.8 C)   TempSrc: Oral   SpO2: 100%   Weight: 178 lb 6.4 oz (80.9 kg)    Cardiac: JVD flat, normal rate and rhythm, clear s1 and s2, no murmurs, rubs or gallops, no LE edema Pulmonary: CTAB, not in distress Abdominal: non distended abdomen, soft and nontender Psych: Alert, conversant, in good spirits   Social History   Socioeconomic History  . Marital status: Single    Spouse name: Not on file  . Number of children: 4  . Years of education: Not on file  . Highest education level: Associate degree: academic program  Occupational History  . Not on file  Social Needs  . Financial resource strain: Not on file  . Food insecurity    Worry: Not on file    Inability: Not on file  . Transportation needs    Medical: Not on file    Non-medical: Not on file  Tobacco Use  . Smoking status: Never Smoker  . Smokeless tobacco: Never Used  Substance and Sexual Activity  . Alcohol use: Yes    Alcohol/week: 1.0 standard drinks    Types: 1 Glasses of wine per week  . Drug use: No  . Sexual activity: Yes    Partners: Male    Birth control/protection: Condom    Comment: s/p tubal ligation  Lifestyle  . Physical activity    Days per week: Not on file    Minutes per session: Not on file  . Stress: Not on file  Relationships  . Social Herbalist on  phone: Not on file    Gets together: Not on file    Attends religious service: Not on file    Active member of club or organization: Not on file    Attends meetings of clubs or organizations: Not on file    Relationship status: Not on file  . Intimate partner violence    Fear of current or ex partner: Not on file    Emotionally abused: Not on file    Physically abused: Not on file    Forced sexual activity: Not on file  Other Topics Concern  . Not on file  Social History Narrative  . Not on file    Family History  Problem Relation Age of Onset  . Hypertension Mother   . Hypertension Father   . Hypertension Brother   . Bladder Cancer Maternal Grandmother 29       Passed away from disease 04-Jul-2005  . Diabetes Maternal Grandmother   . Pancreatic cancer Paternal Grandmother   . Diabetes Paternal Grandmother     Assessment & Plan:   See Encounters Tab for problem based charting.  Patient discussed with Dr. Dareen Piano

## 2018-11-11 NOTE — Patient Instructions (Signed)
Valerie Fletcher, we will start back your amlodipine 10mg  that you can begin taking at night.  We will see you back in about one month to check and see how your blood pressure is doing.

## 2018-11-12 NOTE — Progress Notes (Signed)
Internal Medicine Clinic Attending  Case discussed with Dr. Winfrey  at the time of the visit.  We reviewed the resident's history and exam and pertinent patient test results.  I agree with the assessment, diagnosis, and plan of care documented in the resident's note.  

## 2018-11-20 ENCOUNTER — Ambulatory Visit (INDEPENDENT_AMBULATORY_CARE_PROVIDER_SITE_OTHER): Payer: BC Managed Care – PPO | Admitting: Internal Medicine

## 2018-11-20 ENCOUNTER — Encounter: Payer: Self-pay | Admitting: Internal Medicine

## 2018-11-20 ENCOUNTER — Other Ambulatory Visit: Payer: Self-pay

## 2018-11-20 VITALS — BP 118/86 | HR 92 | Temp 98.4°F | Wt 177.0 lb

## 2018-11-20 DIAGNOSIS — R42 Dizziness and giddiness: Secondary | ICD-10-CM | POA: Insufficient documentation

## 2018-11-20 HISTORY — DX: Dizziness and giddiness: R42

## 2018-11-20 MED ORDER — MECLIZINE HCL 12.5 MG PO TABS: 12.5000 mg | ORAL_TABLET | Freq: Three times a day (TID) | ORAL | 0 refills | Status: DC | PRN

## 2018-11-20 NOTE — Progress Notes (Signed)
CC: vertigo   HPI:  [name] is a 48 y.o. female with PMH below.  Today we will address vertigo   Please see A&P for status of the patient's chronic medical conditions  Past Medical History:  Diagnosis Date  . Asthma   . GERD (gastroesophageal reflux disease)   . Hypertension    Review of Systems:  ROS: Pulmonary: pt denies increased work of breathing, shortness of breath,  Cardiac: pt denies palpitations, chest pain,  Abdominal: pt denies abdominal pain, nausea, vomiting, or diarrhea   Physical Exam:  Vitals:   11/20/18 0856 11/20/18 0857 11/20/18 0858 11/20/18 0900  BP:  127/87 124/82 118/86  Pulse:  81 80 92  Temp:    98.4 F (36.9 C)  TempSrc:    Oral  SpO2:  99%    Weight: 177 lb (80.3 kg)      Cardiac: JVD flat, normal rate and rhythm, clear s1 and s2, no murmurs, rubs or gallops, no LE edema Pulmonary: CTAB, not in distress Abdominal: non distended abdomen, soft and nontender Psych: Alert, conversant, in good spirits Neuro:  II:  Visual fields intact; no extinction to DSS. PERRL.   III,IV, VI: Ptosis not present, horizontal nystagmus is present V,VII: No facial droop. Facial temp sensation equal bilaterally VIII: hearing intact to voice IX,X: Palate rises symmetrically XI: Symmetric shoulder shrug XII: midline tongue extension Motor: RUE and RLE 5/5 LUE 5/5 LLE 5/5 Sensory: Temp and light touch intact x 4 Deep Tendon Reflexes:  Normoactive x 4 without asymmetry Cerebellar: No dysmetria with FNF and HS Gait: Deferred  Normal head impulse, test of skew dix hallpike negative   Social History   Socioeconomic History  . Marital status: Single    Spouse name: Not on file  . Number of children: 4  . Years of education: Not on file  . Highest education level: Associate degree: academic program  Occupational History  . Not on file  Social Needs  . Financial resource strain: Not on file  . Food insecurity    Worry: Not on file   Inability: Not on file  . Transportation needs    Medical: Not on file    Non-medical: Not on file  Tobacco Use  . Smoking status: Never Smoker  . Smokeless tobacco: Never Used  Substance and Sexual Activity  . Alcohol use: Yes    Alcohol/week: 1.0 standard drinks    Types: 1 Glasses of wine per week  . Drug use: No  . Sexual activity: Yes    Partners: Male    Birth control/protection: Condom    Comment: s/p tubal ligation  Lifestyle  . Physical activity    Days per week: Not on file    Minutes per session: Not on file  . Stress: Not on file  Relationships  . Social Herbalist on phone: Not on file    Gets together: Not on file    Attends religious service: Not on file    Active member of club or organization: Not on file    Attends meetings of clubs or organizations: Not on file    Relationship status: Not on file  . Intimate partner violence    Fear of current or ex partner: Not on file    Emotionally abused: Not on file    Physically abused: Not on file    Forced sexual activity: Not on file  Other Topics Concern  . Not on file  Social History Narrative  .  Not on file    Family History  Problem Relation Age of Onset  . Hypertension Mother   . Hypertension Father   . Hypertension Brother   . Bladder Cancer Maternal Grandmother 64       Passed away from disease Jul 03, 2005  . Diabetes Maternal Grandmother   . Pancreatic cancer Paternal Grandmother   . Diabetes Paternal Grandmother     Assessment & Plan:   See Encounters Tab for problem based charting.  Patient discussed with Dr. Rogelia Boga

## 2018-11-20 NOTE — Assessment & Plan Note (Addendum)
Started yesterday 5am when getting ready to get up while still laying down in bed.  Felt like she was swaying, got better when she sat up but it was still there.  Stayed upright for the rest of the day afraid it would go back to the worse initial state upon laying down but it did not.  Today it is better than yesterday.   She has had vertigo diagnosed before some years ago when living in [place] and went to physical therapy for a few weeks which helped.  She also was put on antivert meclizine which helped as well.  That episode resolved on its own.  She took some over the counter motion sickness medicine dramamine which helped.  Associated symptoms: nausea, increased sinus drainage. Negative: headache, vision changes, vomiting, fevers or chills, not orthostatic today, no slurred speech, numbness of the face, Normal head impulse, test of skew.  Neuro exam reassuring.  dix hallpike negative  -treat with antivert -arrange vestibular PT

## 2018-11-20 NOTE — Patient Instructions (Signed)
We will start you back on meclizine and refer you for vestibular physical therapy.  My hope is this will go away before you need the vestibular pt

## 2018-11-25 NOTE — Progress Notes (Signed)
Internal Medicine Clinic Attending  Case discussed with Dr. Winfrey  at the time of the visit.  We reviewed the resident's history and exam and pertinent patient test results.  I agree with the assessment, diagnosis, and plan of care documented in the resident's note.  

## 2018-11-28 ENCOUNTER — Ambulatory Visit (INDEPENDENT_AMBULATORY_CARE_PROVIDER_SITE_OTHER): Payer: BC Managed Care – PPO | Admitting: Radiation Oncology

## 2018-11-28 ENCOUNTER — Other Ambulatory Visit: Payer: Self-pay

## 2018-11-28 ENCOUNTER — Telehealth: Payer: Self-pay | Admitting: *Deleted

## 2018-11-28 VITALS — BP 132/93 | HR 84 | Temp 98.5°F | Ht 64.0 in | Wt 178.4 lb

## 2018-11-28 DIAGNOSIS — K2100 Gastro-esophageal reflux disease with esophagitis, without bleeding: Secondary | ICD-10-CM

## 2018-11-28 DIAGNOSIS — K449 Diaphragmatic hernia without obstruction or gangrene: Secondary | ICD-10-CM | POA: Diagnosis not present

## 2018-11-28 DIAGNOSIS — Z79899 Other long term (current) drug therapy: Secondary | ICD-10-CM

## 2018-11-28 DIAGNOSIS — K219 Gastro-esophageal reflux disease without esophagitis: Secondary | ICD-10-CM

## 2018-11-28 MED ORDER — PANTOPRAZOLE SODIUM 40 MG PO TBEC: 40.0000 mg | DELAYED_RELEASE_TABLET | Freq: Two times a day (BID) | ORAL | 3 refills | Status: DC

## 2018-11-28 MED ORDER — SUCRALFATE 1 GM/10ML PO SUSP: 1.0000 g | Freq: Four times a day (QID) | ORAL | 1 refills | Status: DC

## 2018-11-28 NOTE — Patient Instructions (Addendum)
It was so nice to see you. Today we discussed  Terah was seen today for gastroesophageal reflux.  Diagnoses and all orders for this visit:  Gastroesophageal reflux disease, unspecified whether esophagitis present -     Ambulatory referral to Gastroenterology  Other orders -     pantoprazole (PROTONIX) 40 MG tablet; Take 1 tablet (40 mg total) by mouth 2 (two) times daily. One before breakfast and one before dinner -     sucralfate (CARAFATE) 1 GM/10ML suspension; Take 10 mLs (1 g total) by mouth 4 (four) times daily.    -your acid reflux is worsening and not responding to your regular once daily pantoprazole 40 mg in the morning -we will increase your dose to 40 mg before breakfast and 40 mg before dinner -we will add a medication called sucralfate that will help coat your esophagus to decrease some of your pain; if this medication doesn't help please give [place] a call and we can order the liquid lidocaine for you -we will refer you to a gastroenterologist for further evaluation  Please call our office if you have any other questions.  Sincerely,  Al Decant, MD

## 2018-11-28 NOTE — Assessment & Plan Note (Addendum)
-  pt with long hx of GERD complicated by hiatal hernia s/p repiar now unraveling per prior GI doc -last EGD with esophagitis -pt on pantoprazole 40 mg daily  -sx worsening and having severe nighttime episodes of pain, unresponsive to her 40 mg pantoprazole daily; no hemoptysis, hematochezia, or esophageal spasms -plan to increase to 40 BID, add carafate and refer to GI  -pt told to call if carafate unhelpful or too expensive and we would try liquid lidocaine

## 2018-11-28 NOTE — Progress Notes (Signed)
   CC: acid reflux  HPI:  [name] is a 48 y.o. F here for worsening of her GERD.  Past Medical History:  Diagnosis Date  . Asthma   . GERD (gastroesophageal reflux disease)   . Hypertension    Review of Systems:    Review of Systems  Constitutional: Negative for weight loss.  Respiratory: Positive for cough. Negative for hemoptysis.   Gastrointestinal: Positive for heartburn, nausea and vomiting (gagging). Negative for blood in stool and melena.   Physical Exam:  Vitals:   11/28/18 1314  BP: (!) 132/93  Pulse: 84  Temp: 98.5 F (36.9 C)  TempSrc: Oral  SpO2: 100%  Weight: 178 lb 6.4 oz (80.9 kg)  Height: 5\' 4"  (1.626 m)   Physical Exam  Constitutional: She is oriented to person, place, and time and well-developed, well-nourished, and in no distress. No distress.  HENT:  Head: Normocephalic and atraumatic.  Cardiovascular: Normal rate, regular rhythm and normal heart sounds.  No murmur heard. Pulmonary/Chest: Effort normal and breath sounds normal. No respiratory distress.  Abdominal: Soft. Bowel sounds are normal. She exhibits no distension.  Neurological: She is alert and oriented to person, place, and time.  Skin: Skin is warm and dry. She is not diaphoretic. No erythema.  Psychiatric: Affect normal.  Nursing note and vitals reviewed.  Assessment & Plan:   See Encounters Tab for problem based charting.  Patient seen with Dr. Daryll Drown

## 2018-11-28 NOTE — Telephone Encounter (Signed)
Pt states she had surgery in past for hiatal hernia and it is "pulling loose". States her reflux is getting worse, has been bad last 2 days, thinks last pm she "aspirated" because she is coughing now and feels like something is in her lungs. Denies fevers, chest pain, short of breath, N&V.  ACC at 1315 10/1

## 2018-11-29 NOTE — Progress Notes (Signed)
Internal Medicine Clinic Attending  I saw and evaluated the patient.  I personally confirmed the key portions of the history and exam documented by Dr. Lanier and I reviewed pertinent patient test results.  The assessment, diagnosis, and plan were formulated together and I agree with the documentation in the resident's note.   

## 2018-12-03 ENCOUNTER — Other Ambulatory Visit: Payer: Self-pay | Admitting: *Deleted

## 2018-12-04 ENCOUNTER — Telehealth: Payer: Self-pay | Admitting: *Deleted

## 2018-12-04 NOTE — Telephone Encounter (Signed)
Call to [place] for [place] for Pantoprazole 40 mg twice daily.  Information was given approved 12/04/2018 thru 12/04/2019. [place] [id number]. [facility] was called at [phone] and informed of approval.  Sander Nephew, RN 12/04/2018 12:16 PM.

## 2018-12-04 NOTE — Telephone Encounter (Signed)
If she wasn't able to get it still please let me know

## 2018-12-04 NOTE — Telephone Encounter (Signed)
Looks like it was approved.

## 2018-12-11 NOTE — Addendum Note (Signed)
Addended by: Hulan Fray on: 12/11/2018 05:25 PM   Modules accepted: Orders

## 2018-12-12 DIAGNOSIS — K581 Irritable bowel syndrome with constipation: Secondary | ICD-10-CM | POA: Diagnosis not present

## 2018-12-12 DIAGNOSIS — Z1159 Encounter for screening for other viral diseases: Secondary | ICD-10-CM | POA: Diagnosis not present

## 2018-12-12 DIAGNOSIS — Z1211 Encounter for screening for malignant neoplasm of colon: Secondary | ICD-10-CM | POA: Diagnosis not present

## 2018-12-12 DIAGNOSIS — K219 Gastro-esophageal reflux disease without esophagitis: Secondary | ICD-10-CM | POA: Diagnosis not present

## 2018-12-12 NOTE — Telephone Encounter (Signed)
Pt is taking 2x daily as prescribed at last visit w/ dr lanier, the pharm system did not recognize dr lanier, please approve the script, the one she did had wrong amt

## 2018-12-13 MED ORDER — PANTOPRAZOLE SODIUM 40 MG PO TBEC: 40.0000 mg | DELAYED_RELEASE_TABLET | Freq: Two times a day (BID) | ORAL | 1 refills | Status: DC

## 2018-12-13 NOTE — Telephone Encounter (Signed)
filled

## 2019-01-21 ENCOUNTER — Other Ambulatory Visit: Payer: Self-pay | Admitting: Internal Medicine

## 2019-01-21 NOTE — Telephone Encounter (Signed)
Called pt - informed rx was refilled 10/16 #180 x 1 RF. Asked pt to call [facility] and speak the pharmacist; rx may have a different refill #. Stated she will.

## 2019-01-21 NOTE — Telephone Encounter (Signed)
Needs refill on pantoprazole (PROTONIX) 40 MG tablet ;pt contact [phone]  [id number] [place] [place] - [address]

## 2019-02-14 ENCOUNTER — Other Ambulatory Visit: Payer: Self-pay | Admitting: Internal Medicine

## 2019-02-14 DIAGNOSIS — E785 Hyperlipidemia, unspecified: Secondary | ICD-10-CM

## 2019-06-14 ENCOUNTER — Other Ambulatory Visit: Payer: Self-pay | Admitting: Internal Medicine

## 2019-06-14 DIAGNOSIS — I10 Essential (primary) hypertension: Secondary | ICD-10-CM

## 2019-06-18 ENCOUNTER — Encounter: Payer: Self-pay | Admitting: *Deleted

## 2019-07-07 ENCOUNTER — Encounter: Payer: BC Managed Care – PPO | Admitting: Internal Medicine

## 2019-08-06 ENCOUNTER — Other Ambulatory Visit: Payer: Self-pay | Admitting: Internal Medicine

## 2019-08-06 DIAGNOSIS — I10 Essential (primary) hypertension: Secondary | ICD-10-CM

## 2019-08-06 MED ORDER — CARVEDILOL 25 MG PO TABS: 25.0000 mg | ORAL_TABLET | Freq: Two times a day (BID) | ORAL | 5 refills | Status: DC

## 2019-08-06 NOTE — Telephone Encounter (Signed)
Pt stated [facility] had asked [facility] since she was not out at that time. So now that she needs a refill, she was told the rx was cancelled.  [facility], [facility] to [facility] - stated there's no refills on Coreg. Informed it was refilled 4/19 x 5 RF. Stated they did not received the refill. Dr Frances Furbish can you send a new rx? Thanks

## 2019-08-06 NOTE — Telephone Encounter (Signed)
Refill Request-Pt states she called her pharmacy and was told the below prescription was cancelled and she would like to know why.  Please call the patient back.   carvedilol (COREG) 25 MG tablet  [id number] [place], [place] - [address]

## 2019-08-06 NOTE — Telephone Encounter (Signed)
No problem, refill re sent

## 2019-12-16 ENCOUNTER — Telehealth: Payer: Self-pay

## 2019-12-17 NOTE — Telephone Encounter (Signed)
Patient sent message via my chart to get in contact with clinic to schedule an appointment.

## 2019-12-24 ENCOUNTER — Other Ambulatory Visit: Payer: Self-pay

## 2019-12-24 ENCOUNTER — Ambulatory Visit (INDEPENDENT_AMBULATORY_CARE_PROVIDER_SITE_OTHER): Payer: BC Managed Care – PPO | Admitting: Internal Medicine

## 2019-12-24 ENCOUNTER — Telehealth: Payer: Self-pay

## 2019-12-24 DIAGNOSIS — J069 Acute upper respiratory infection, unspecified: Secondary | ICD-10-CM

## 2019-12-24 NOTE — Telephone Encounter (Signed)
Pls contact pt [phone]

## 2019-12-24 NOTE — Telephone Encounter (Signed)
Sounds good. Thank you

## 2019-12-24 NOTE — Telephone Encounter (Signed)
RTC, patient is requesting an appt, but is unsure if she should come into clinic.  States she started with a sore throat on Sunday, fever yesterday (101 highest), muscle aches and fatigue.  She denies any SOB.  States she is fully vaccinated against Covid.  She has been taking Mucinex and benadryl and reports some relief from this combination. Telehealth appt made for today at 3:45 with red team/ Dr. Marchia Bond. SChaplin, RN,BSN

## 2019-12-24 NOTE — Progress Notes (Signed)
I connected with  Valerie Fletcher on 12/25/19 by a video enabled telemedicine application and verified that I am speaking with the correct person using two identifiers.   I discussed the limitations of evaluation and management by telemedicine. The patient expressed understanding and agreed to proceed.     CC: URI  This is a telephone encounter between Valerie Fletcher and Valerie Fletcher on 12/25/2019 for URI. The visit was conducted with the patient located at home and Valerie Fletcher at [facility]. The patient's identity was confirmed using their DOB and current address. The patient has consented to being evaluated through a telephone encounter and understands the associated risks (an examination cannot be done and the patient may need to come in for an appointment) / benefits (allows the patient to remain at home, decreasing exposure to coronavirus). I personally spent 8 minutes on medical discussion.   HPI:  [name] is a 49 y.o. with PMH as below.   Please see A&P for assessment of the patient's acute and chronic medical conditions.   Past Medical History:  Diagnosis Date  . Asthma   . GERD (gastroesophageal reflux disease)   . Hypertension    Review of Systems:  Negative except what is noted on assessment and plan    Assessment & Plan:   See Encounters Tab for problem based charting.  Patient discussed with Dr. Oswaldo Done

## 2019-12-25 ENCOUNTER — Encounter: Payer: Self-pay | Admitting: Internal Medicine

## 2019-12-25 NOTE — Assessment & Plan Note (Signed)
Since with a 1 week history of fatigue, fever (101F), chills, diaphoresis, sinus and congestion, rhinorrhea, myalgias, and decreased appetite.  She denies any shortness of breath, changes in smell or taste, COVID19 contacts.  Patient states she is been taking Benadryl Mucinex with mild relief of her symptoms.  She states that her children are also experiencing similar symptoms.  She states that she has not been tested for coronavirus at this time and is not currently vaccinated against COVID-19.  Although patient likely has an upper respiratory viral infection, is difficult to discern if her symptoms are due to COVID-19 infection.  I counseled her on the importance of getting tested and letting us know the results of her test.  If patient does have a positive test she will likely benefit from a referral to the outpatient infusion center.  Otherwise I counseled her on continuing Benadryl Mucinex and adding Tylenol for fever.  I encouraged her to go to the emergency room if her symptoms get worse or she develops shortness of breath.   Plan: -Get COVID-19 test -Continue conservative management with Tylenol, Mucinex, Benadryl.

## 2019-12-26 NOTE — Progress Notes (Signed)
Internal Medicine Clinic Attending  Case discussed with Dr. Coe  At the time of the visit.  We reviewed the resident's history and pertinent patient test results.  I agree with the assessment, diagnosis, and plan of care documented in the resident's note.  

## 2019-12-30 ENCOUNTER — Telehealth: Payer: Self-pay | Admitting: *Deleted

## 2019-12-30 NOTE — Telephone Encounter (Signed)
Spoke w/ pt again, she states she will go to [facility]

## 2019-12-30 NOTE — Telephone Encounter (Signed)
Pt states she is continuing to not feel well, coughing up thick green mucous. States she has not had COVID testing. Her children were sick and tested NEGATIVE for COVID. She states she can "feel it in her chest". No fevers at this not. Just is very congested and tired. Please advise in person appt, urg care?Had telehealth appt dr Marchia Bond 10/27

## 2019-12-30 NOTE — Telephone Encounter (Signed)
Hello,  Does she feel like she is getting worse or having shortness of breath? If either of those then I would recommend going to an urgent care to get tested for COVID and to be evaluated. If she feels about the same and not having any shortness of breath then I would recommend her getting a COVID test and letting us know of the results. I would advise her to continue taking the tylenol and mucinex as needed, to get plenty of rest, and to stay well hydrated. Thank you,  Hermine Messick

## 2020-01-02 ENCOUNTER — Encounter: Payer: Self-pay | Admitting: Internal Medicine

## 2020-02-25 ENCOUNTER — Emergency Department (HOSPITAL_COMMUNITY)
Admission: EM | Admit: 2020-02-25 | Discharge: 2020-02-26 | Disposition: A | Discharge status: Home / Self Care | Payer: BC Managed Care – PPO | Attending: Emergency Medicine | Admitting: Emergency Medicine

## 2020-02-25 ENCOUNTER — Encounter (HOSPITAL_COMMUNITY): Payer: Self-pay | Admitting: Emergency Medicine

## 2020-02-25 ENCOUNTER — Other Ambulatory Visit: Payer: Self-pay

## 2020-02-25 ENCOUNTER — Emergency Department (HOSPITAL_COMMUNITY): Payer: BC Managed Care – PPO

## 2020-02-25 DIAGNOSIS — Z79899 Other long term (current) drug therapy: Secondary | ICD-10-CM | POA: Diagnosis not present

## 2020-02-25 DIAGNOSIS — R059 Cough, unspecified: Secondary | ICD-10-CM | POA: Diagnosis not present

## 2020-02-25 DIAGNOSIS — I10 Essential (primary) hypertension: Secondary | ICD-10-CM | POA: Insufficient documentation

## 2020-02-25 DIAGNOSIS — J45909 Unspecified asthma, uncomplicated: Secondary | ICD-10-CM | POA: Diagnosis not present

## 2020-02-25 DIAGNOSIS — U071 COVID-19: Secondary | ICD-10-CM | POA: Insufficient documentation

## 2020-02-25 DIAGNOSIS — R509 Fever, unspecified: Secondary | ICD-10-CM | POA: Diagnosis not present

## 2020-02-25 NOTE — ED Triage Notes (Signed)
Patient reports fever with chills , productive cough and nasal congestion onset yesterday .

## 2020-02-26 DIAGNOSIS — U071 COVID-19: Secondary | ICD-10-CM | POA: Diagnosis not present

## 2020-02-26 LAB — RESP PANEL BY RT-PCR (FLU A&B, COVID) ARPGX2
Influenza A by PCR: NEGATIVE
Influenza B by PCR: NEGATIVE
SARS Coronavirus 2 by RT PCR: POSITIVE — AB

## 2020-02-26 NOTE — Discharge Instructions (Signed)
You have tested positive for COVID-19 virus.  Please continue to quarantine at home and monitor your symptoms closely. You chest x-ray was clear. Antibiotics are not helpful in treating viral infection, the virus should run its course in about 5-7 days. Please make sure you are drinking plenty of fluids. You can treat your symptoms supportively with tylenol for fevers and pains, and over the counter cough syrups and throat lozenges to help with cough. If your symptoms are not improving please follow up with you Primary doctor.   I recommend that you purchase a home pulse ox to help better monitor your oxygen at home, if you start to have increased work of breathing or shortness of breath or your oxygen drops below 90% please immediately return to the hospital for reevaluation.  If you develop persistent fevers, shortness of breath or difficulty breathing, chest pain, severe headache and neck pain, persistent nausea and vomiting or other new or concerning symptoms return to the Emergency department.  I have referred you to the Covid monoclonal antibody infusion center, you can call this phone number for more information [phone]

## 2020-02-26 NOTE — ED Notes (Signed)
Patient moved to appropriate lobby waiting area

## 2020-02-26 NOTE — ED Provider Notes (Signed)
[facility] EMERGENCY DEPARTMENT Provider Note   CSN: [id number] Arrival date & time: 02/25/20  06/[birth year]     History Chief Complaint  Patient presents with  . Fever/Cough/Nasal Congestion     Valerie Fletcher is a 49 y.o. female.  Valerie Fletcher is a 49 y.o. female with a history of hypertension, GERD, asthma, iron deficiency anemia, who presents to the emergency department for evaluation of 3 days of fever, chills, fatigue, productive cough and congestion.  Symptoms have been persistent over the last 3 days.  Symptoms initially started primarily with chills and fatigue.  She does not have any known sick contacts.  Has received 2 doses of the Pfizer Covid vaccine but is not yet eligible for a booster.  Denies chest pain or shortness of breath.  No abdominal pain, nausea, vomiting or diarrhea.  No loss of taste or smell.  No headaches.  She has had some myalgias.  No meds prior to arrival.  No other aggravating or alleviating factors.  The history is provided by the patient.       Past Medical History:  Diagnosis Date  . Asthma   . GERD (gastroesophageal reflux disease)   . Hypertension     Patient Active Problem List   Diagnosis Date Noted  . Vertigo 11/20/2018  . Viral upper respiratory tract infection 05/01/2018  . Prediabetes 04/08/2018  . Iron deficiency anemia 04/08/2018  . Vaginal candidiasis 10/05/2017  . Hyperlipidemia 06/30/2017  . Rapid palpitations 06/29/2017  . Hypertension 02/14/2017  . GERD (gastroesophageal reflux disease) 02/14/2017  . Asthma 02/14/2017  . Fatigue 02/14/2017    Past Surgical History:  Procedure Laterality Date  . HERNIA REPAIR     Umbilical hernia x2, Hiatial hernia  . TUBAL LIGATION    . URETHRAL DIVERTICULECTOMY  03/2016     OB History    Gravida  5   Para  4   Term  1   Preterm  3   AB  1   Living  0     SAB  1   IAB  0   Ectopic  0   Multiple  0   Live Births  0           Family History   Problem Relation Age of Onset  . Hypertension Mother   . Hypertension Father   . Hypertension Brother   . Bladder Cancer Maternal Grandmother 81       Passed away from disease 26-Jun-2005  . Diabetes Maternal Grandmother   . Pancreatic cancer Paternal Grandmother   . Diabetes Paternal Grandmother     Social History   Tobacco Use  . Smoking status: Never Smoker  . Smokeless tobacco: Never Used  Vaping Use  . Vaping Use: Never used  Substance Use Topics  . Alcohol use: Yes    Alcohol/week: 1.0 standard drink    Types: 1 Glasses of wine per week  . Drug use: No    Home Medications Prior to Admission medications   Medication Sig Start Date End Date Taking? Authorizing Provider  albuterol (PROVENTIL HFA;VENTOLIN HFA) 108 (90 Base) MCG/ACT inhaler Inhale 1 puff into the lungs every 4 (four) hours as needed for wheezing or shortness of breath.    [provider]  amLODipine (NORVASC) 10 MG tablet TAKE 1 TABLET BY MOUTH EVERYDAY AT BEDTIME 06/16/19   Angelita Ingles, MD  benzonatate (TESSALON) 100 MG capsule Take 1 capsule (100 mg total) by mouth 3 (three) times daily  as needed for cough. 04/30/18   Geralyn CorwinHoffman, Jessica Ratliff, DO  carvedilol (COREG) 25 MG tablet Take 1 tablet (25 mg total) by mouth 2 (two) times daily with a meal. 08/06/19   Angelita InglesWinfrey, William B, MD  Dextromethorphan-guaiFENesin 10-100 MG/5ML liquid Take 5 mLs by mouth every 12 (twelve) hours. 04/30/18   Camelia PhenesHoffman, Jessica Ratliff, DO  meclizine (ANTIVERT) 12.5 MG tablet Take 1-2 tablets (12.5-25 mg total) by mouth 3 (three) times daily as needed for dizziness. 11/20/18   Angelita InglesWinfrey, William B, MD  olmesartan (BENICAR) 40 MG tablet Take 1 tablet (40 mg total) by mouth at bedtime. 09/30/18   Angelita InglesWinfrey, William B, MD  pantoprazole (PROTONIX) 40 MG tablet Take 1 tablet (40 mg total) by mouth 2 (two) times daily. One before breakfast and one before dinner 12/13/18   Angelita InglesWinfrey, William B, MD  rosuvastatin (CRESTOR) 20 MG tablet TAKE 1 TABLET  BY MOUTH EVERY DAY 02/14/19   Angelita InglesWinfrey, William B, MD  sodium chloride (OCEAN) 0.65 % SOLN nasal spray Place 1 spray into both nostrils as needed for congestion. 04/30/18   Geralyn CorwinHoffman, Jessica Ratliff, DO  sucralfate (CARAFATE) 1 GM/10ML suspension Take 10 mLs (1 g total) by mouth 4 (four) times daily. 11/28/18 11/28/19  Jenell MillinerLanier, Claire, MD    Allergies    Prednisone, Compazine [prochlorperazine edisylate], Dilaudid [hydromorphone], and Percocet [oxycodone-acetaminophen]  Review of Systems   Review of Systems  Constitutional: Positive for chills and fever.  HENT: Positive for congestion and rhinorrhea. Negative for ear pain and sore throat.   Eyes: Negative for pain.  Respiratory: Positive for cough. Negative for shortness of breath.   Cardiovascular: Negative for chest pain and palpitations.  Gastrointestinal: Negative for abdominal pain, nausea and vomiting.  Genitourinary: Negative for dysuria, frequency and hematuria.  Musculoskeletal: Positive for myalgias. Negative for arthralgias and back pain.  Skin: Negative for color change and rash.  Neurological: Negative for seizures, syncope and headaches.  All other systems reviewed and are negative.   Physical Exam Updated Vital Signs BP (!) 140/99 (BP Location: Left Arm)   Pulse 88   Temp 99.4 F (37.4 C) (Oral)   Resp 17   Ht 5\' 4"  (1.626 m)   Wt 75.8 kg   SpO2 100%   BMI 28.67 kg/m   Physical Exam Vitals and nursing note reviewed.  Constitutional:      General: She is not in acute distress.    Appearance: Normal appearance. She is well-developed and well-nourished. She is not ill-appearing or diaphoretic.     Comments: Well-appearing and in no distress   HENT:     Head: Normocephalic and atraumatic.     Nose: Congestion and rhinorrhea present.     Mouth/Throat:     Mouth: Mucous membranes are moist.     Pharynx: Oropharynx is clear. Posterior oropharyngeal erythema present. No oropharyngeal exudate.     Comments: Posterior  oropharynx clear and mucous membranes moist, there is mild erythema but no edema or tonsillar exudates, uvula midline, normal phonation, no trismus, tolerating secretions without difficulty.  Eyes:     General:        Right eye: No discharge.        Left eye: No discharge.  Cardiovascular:     Rate and Rhythm: Normal rate and regular rhythm.     Heart sounds: Normal heart sounds.  Pulmonary:     Effort: Pulmonary effort is normal. No respiratory distress.     Breath sounds: Normal breath sounds.  Comments: Respirations equal and unlabored, patient able to speak in full sentences, lungs clear to auscultation bilaterally Abdominal:     General: Bowel sounds are normal. There is no distension.     Palpations: Abdomen is soft. There is no mass.     Tenderness: There is no abdominal tenderness. There is no guarding.  Musculoskeletal:        General: No deformity.     Cervical back: Neck supple. No rigidity.  Skin:    General: Skin is warm and dry.  Neurological:     Mental Status: She is alert and oriented to person, place, and time.     Coordination: Coordination normal.  Psychiatric:        Mood and Affect: Mood and affect and mood normal.        Behavior: Behavior normal.     ED Results / Procedures / Treatments   Labs (all labs ordered are listed, but only abnormal results are displayed) Labs Reviewed  RESP PANEL BY RT-PCR (FLU A&B, COVID) ARPGX2 - Abnormal; Notable for the following components:      Result Value   SARS Coronavirus 2 by RT PCR POSITIVE (*)    All other components within normal limits    EKG None  Radiology DG Chest Portable 1 View  Result Date: 02/25/2020 CLINICAL DATA:  Fever, chills, cough, and nasal congestion. EXAM: PORTABLE CHEST 1 VIEW COMPARISON:  02/02/2017 FINDINGS: The heart size and mediastinal contours are within normal limits. Both lungs are clear. The visualized skeletal structures are unremarkable. IMPRESSION: No active disease.  Electronically Signed   By: Burman Nieves M.D.   On: 02/25/2020 23:24    Procedures Procedures (including critical care time)  Medications Ordered in ED Medications - No data to display  ED Course  I have reviewed the triage vital signs and the nursing notes.  Pertinent labs & imaging results that were available during my care of the patient were reviewed by me and considered in my medical decision making (see chart for details).    MDM Rules/Calculators/A&P                           49 y.o. F presents with 3 days of fever chills cough nasal congestion.  No known sick contacts.  Has had 2 doses of Pfizer vaccine, not eligible for booster yet.  Overall symptoms have been mild and she denies any chest pain or shortness of breath, she is well-appearing, was initially febrile but this has resolved and she has no hypoxia at rest or with ambulation.  Chest x-ray without signs of Covid pneumonia.  Patient has tested positive for Covid today, fortunately symptoms are mild, she does not meet criteria for admission.  She has been referred for monoclonal antibody infusion.  Discussed symptomatic treatment at home and strict return precautions.  Patient expresses understanding and agreement.  Discharged home in good condition.  Valerie Fletcher was evaluated in Emergency Department on 02/26/2020 for the symptoms described in the history of present illness. She was evaluated in the context of the global COVID-19 pandemic, which necessitated consideration that the patient might be at risk for infection with the SARS-CoV-2 virus that causes COVID-19. Institutional protocols and algorithms that pertain to the evaluation of patients at risk for COVID-19 are in a state of rapid change based on information released by regulatory bodies including the CDC and federal and state organizations. These policies and algorithms were followed during the  patient's care in the ED.  Final Clinical Impression(s) / ED  Diagnoses Final diagnoses:  COVID-19 virus infection    Rx / DC Orders ED Discharge Orders    None       Legrand Rams 02/26/20 1606    Gwyneth Sprout, MD 02/26/20 2102

## 2020-03-09 ENCOUNTER — Telehealth: Payer: Self-pay

## 2020-03-09 NOTE — Telephone Encounter (Signed)
Pt called requested a hospital F/U  appt from 02/25/2020 appt ( tested for covid  PT has been quarantined since then and has no symptoms as of today ) was given 03/17/20 @ 2:15

## 2020-03-17 ENCOUNTER — Encounter: Payer: Self-pay | Admitting: Internal Medicine

## 2020-03-17 ENCOUNTER — Ambulatory Visit (INDEPENDENT_AMBULATORY_CARE_PROVIDER_SITE_OTHER): Payer: BC Managed Care – PPO | Admitting: Internal Medicine

## 2020-03-17 ENCOUNTER — Other Ambulatory Visit: Payer: Self-pay

## 2020-03-17 VITALS — BP 138/89 | HR 78 | Temp 98.3°F | Ht 64.0 in | Wt 180.1 lb

## 2020-03-17 DIAGNOSIS — I10 Essential (primary) hypertension: Secondary | ICD-10-CM

## 2020-03-17 DIAGNOSIS — J069 Acute upper respiratory infection, unspecified: Secondary | ICD-10-CM

## 2020-03-17 DIAGNOSIS — E785 Hyperlipidemia, unspecified: Secondary | ICD-10-CM

## 2020-03-17 DIAGNOSIS — K219 Gastro-esophageal reflux disease without esophagitis: Secondary | ICD-10-CM | POA: Diagnosis not present

## 2020-03-17 MED ORDER — CARVEDILOL 25 MG PO TABS: 25.0000 mg | ORAL_TABLET | Freq: Two times a day (BID) | ORAL | 5 refills | Status: DC

## 2020-03-17 MED ORDER — ROSUVASTATIN CALCIUM 20 MG PO TABS: 20.0000 mg | ORAL_TABLET | Freq: Every day | ORAL | 2 refills | Status: DC

## 2020-03-17 MED ORDER — PANTOPRAZOLE SODIUM 40 MG PO TBEC: 40.0000 mg | DELAYED_RELEASE_TABLET | Freq: Every day | ORAL | 1 refills | Status: DC

## 2020-03-17 MED ORDER — OLMESARTAN MEDOXOMIL 40 MG PO TABS: 40.0000 mg | ORAL_TABLET | Freq: Every day | ORAL | 3 refills | Status: DC

## 2020-03-17 MED ORDER — AMLODIPINE BESYLATE 10 MG PO TABS
ORAL_TABLET | ORAL | 5 refills | Status: DC
Start: 2020-03-17 — End: 2020-11-25

## 2020-03-17 NOTE — Assessment & Plan Note (Addendum)
Patient presents for reevaluation of her blood pressure.  Her blood pressure today is 138/89 on amlodipine 10 mg, carvedilol 25 mg twice daily, olmesartan 40 mg daily.  She states that she needs refill for all of her medications but denies running out recently.  She states that she takes her blood pressure at home but is not have a good idea of what its been running.  She thinks it may be running as high as 150/89 at home but is unsure.  I counseled her on refilling her medications today and keeping a blood pressure log over the next week to see if we need to make adjustments to her antihypertensive medications.  Patient can follow-up with a telehealth appointment.  Counseled her regarding exercise goals, DASH diet, and limiting salt intake.    Plan: -Continue current antihypertensive medications  -Keep blood pressure log over the next week and follow-up in 1 week with a telehealth visit to review the log.   -Lifestyle modifications including 150 minutes of exercise weekly, DASH diet, and less than 2.3 g sodium daily.    -We will check a BMP today to assess kidney function electrolytes.

## 2020-03-17 NOTE — Patient Instructions (Addendum)
Thank you, [name] for allowing [place] to provide your care today. Today we discussed blood pressure, cholesterol, reflux.    I have ordered the following labs for you:   Lab Orders     BMP8+Anion Gap   Tests ordered today:  none  Referrals ordered today:    Referral Orders     Ambulatory referral to Gastroenterology   I have ordered the following medication/changed the following medications:   Stop the following medications: Medications Discontinued During This Encounter  Medication Reason  . olmesartan (BENICAR) 40 MG tablet Reorder  . pantoprazole (PROTONIX) 40 MG tablet   . rosuvastatin (CRESTOR) 20 MG tablet Reorder  . amLODipine (NORVASC) 10 MG tablet Reorder  . carvedilol (COREG) 25 MG tablet Reorder     Start the following medications: Meds ordered this encounter  Medications  . pantoprazole (PROTONIX) 40 MG tablet    Sig: Take 1 tablet (40 mg total) by mouth daily. One before breakfast and one before dinner    Dispense:  180 tablet    Refill:  1  . rosuvastatin (CRESTOR) 20 MG tablet    Sig: Take 1 tablet (20 mg total) by mouth daily.    Dispense:  90 tablet    Refill:  2  . amLODipine (NORVASC) 10 MG tablet    Sig: TAKE 1 TABLET BY MOUTH EVERYDAY AT BEDTIME    Dispense:  30 tablet    Refill:  5  . olmesartan (BENICAR) 40 MG tablet    Sig: Take 1 tablet (40 mg total) by mouth at bedtime.    Dispense:  90 tablet    Refill:  3  . carvedilol (COREG) 25 MG tablet    Sig: Take 1 tablet (25 mg total) by mouth 2 (two) times daily with a meal.    Dispense:  60 tablet    Refill:  5     Follow up: 1 week via telehealth visit then 6 months in person    Remember: keep a BP log and share it with [place] a follow up  Should you have any questions or concerns please call the internal medicine clinic at [phone].     Dellia Cloud, D.O. [facility]

## 2020-03-17 NOTE — Assessment & Plan Note (Addendum)
Patient presents after recently having COVID-19 upper respiratory infection.  Patient states that she does have some lingering symptoms including nasal congestion, cough.  I counseled her to continue conservative management of her symptoms.

## 2020-03-17 NOTE — Assessment & Plan Note (Addendum)
Patient has a history of significantly elevated cholesterol and LDL.  She was previously on rosuvastatin 20 mg daily and tolerated it well.  She has no history of heart attack or stroke.  She does not have history of diabetes or smoking but does have hypertension and is on medication management.  I discussed her risk of cardiovascular disease in the next 10 years was between 5 and 7.5% and gave her the option of restarting her rosuvastatin 20 mg daily.  Patient elected to restart this medication today.

## 2020-03-17 NOTE — Progress Notes (Signed)
CC: HTN  HPI:  [name] is a 50 y.o. female with a past medical history stated below and presents today for HTN. Please see problem based assessment and plan for additional details.  Past Medical History:  Diagnosis Date  . Asthma   . GERD (gastroesophageal reflux disease)   . Hypertension     Current Outpatient Medications on File Prior to Visit  Medication Sig Dispense Refill  . albuterol (PROVENTIL HFA;VENTOLIN HFA) 108 (90 Base) MCG/ACT inhaler Inhale 1 puff into the lungs every 4 (four) hours as needed for wheezing or shortness of breath.    . benzonatate (TESSALON) 100 MG capsule Take 1 capsule (100 mg total) by mouth 3 (three) times daily as needed for cough. 20 capsule 0  . Dextromethorphan-guaiFENesin 10-100 MG/5ML liquid Take 5 mLs by mouth every 12 (twelve) hours. 118 mL 0  . meclizine (ANTIVERT) 12.5 MG tablet Take 1-2 tablets (12.5-25 mg total) by mouth 3 (three) times daily as needed for dizziness. 90 tablet 0  . sodium chloride (OCEAN) 0.65 % SOLN nasal spray Place 1 spray into both nostrils as needed for congestion. 15 mL 0  . sucralfate (CARAFATE) 1 GM/10ML suspension Take 10 mLs (1 g total) by mouth 4 (four) times daily. 420 mL 1   No current facility-administered medications on file prior to visit.    Family History  Problem Relation Age of Onset  . Hypertension Mother   . Hypertension Father   . Hypertension Brother   . Bladder Cancer Maternal Grandmother 68       Passed away from disease 06-25-05  . Diabetes Maternal Grandmother   . Pancreatic cancer Paternal Grandmother   . Diabetes Paternal Grandmother     Social History   Socioeconomic History  . Marital status: Single    Spouse name: Not on file  . Number of children: 4  . Years of education: Not on file  . Highest education level: Associate degree: academic program  Occupational History  . Not on file  Tobacco Use  . Smoking status: Never Smoker  . Smokeless tobacco: Never Used   Vaping Use  . Vaping Use: Never used  Substance and Sexual Activity  . Alcohol use: Yes    Alcohol/week: 1.0 standard drink    Types: 1 Glasses of wine per week  . Drug use: No  . Sexual activity: Yes    Partners: Male    Birth control/protection: Condom    Comment: s/p tubal ligation  Other Topics Concern  . Not on file  Social History Narrative  . Not on file   Social Determinants of Health   Financial Resource Strain: Not on file  Food Insecurity: Not on file  Transportation Needs: Not on file  Physical Activity: Not on file  Stress: Not on file  Social Connections: Not on file  Intimate Partner Violence: Not on file    Review of Systems: ROS negative except for what is noted on the assessment and plan.  Vitals:   03/17/20 1418  BP: 138/89  Pulse: 78  Temp: 98.3 F (36.8 C)  TempSrc: Oral  SpO2: 100%  Weight: 180 lb 1.6 oz (81.7 kg)  Height: 5\' 4"  (1.626 m)     Physical Exam: Physical Exam Constitutional:      Appearance: Normal appearance.  HENT:     Head: Normocephalic and atraumatic.  Eyes:     Extraocular Movements: Extraocular movements intact.  Cardiovascular:     Rate and Rhythm: Normal rate.  Pulses: Normal pulses.     Heart sounds: Normal heart sounds.  Pulmonary:     Effort: Pulmonary effort is normal.     Breath sounds: Normal breath sounds.  Abdominal:     General: Bowel sounds are normal.     Palpations: Abdomen is soft.     Tenderness: There is no abdominal tenderness.  Musculoskeletal:        General: Normal range of motion.     Cervical back: Normal range of motion.     Right lower leg: No edema.     Left lower leg: No edema.  Skin:    General: Skin is warm and dry.  Neurological:     Mental Status: She is alert and oriented to person, place, and time. Mental status is at baseline.  Psychiatric:        Mood and Affect: Mood normal.      Assessment & Plan:   See Encounters Tab for problem based charting.  Patient  discussed with Dr. Janeece Agee, D.O. [facility], PGY-2 Pager: [phone], Phone: [phone] Date 03/19/2020 Time 10:32 AM

## 2020-03-18 LAB — BMP8+ANION GAP
Anion Gap: 12 mmol/L (ref 10.0–18.0)
BUN/Creatinine Ratio: 11 (ref 9–23)
BUN: 9 mg/dL (ref 6–24)
CO2: 24 mmol/L (ref 20–29)
Calcium: 9.7 mg/dL (ref 8.7–10.2)
Chloride: 101 mmol/L (ref 96–106)
Creatinine, Ser: 0.81 mg/dL (ref 0.57–1.00)
GFR calc Af Amer: 98 mL/min/{1.73_m2} (ref >59)
GFR calc non Af Amer: 85 mL/min/{1.73_m2} (ref >59)
Glucose: 93 mg/dL (ref 65–99)
Potassium: 4.1 mmol/L (ref 3.5–5.2)
Sodium: 137 mmol/L (ref 134–144)

## 2020-03-19 ENCOUNTER — Encounter: Payer: Self-pay | Admitting: Internal Medicine

## 2020-03-19 NOTE — Assessment & Plan Note (Signed)
Patient admits to longstanding history of gastroesophageal reflux secondary to a hiatal hernia that has since been repaired.  Patient continues to have reflux symptoms and has run out of her PPI.  Patient states that she been on PPI medications for the better part of 10 to 20 years.  I discussed the importance of following up with the gastroenterologist for an upper endoscopy for her resistant GERD particularly in the setting of her previous hiatal hernia surgery.  Plan: -Refill pantoprazole 40 mg daily, discussed other options including Pepcid -Referral for gastroenterology ordered today.

## 2020-03-23 NOTE — Progress Notes (Signed)
Internal Medicine Clinic Attending  Case discussed with Dr. Coe  At the time of the visit.  We reviewed the resident's history and exam and pertinent patient test results.  I agree with the assessment, diagnosis, and plan of care documented in the resident's note.  

## 2020-04-08 ENCOUNTER — Encounter: Payer: Self-pay | Admitting: Gastroenterology

## 2020-05-06 ENCOUNTER — Other Ambulatory Visit: Payer: Self-pay

## 2020-05-06 ENCOUNTER — Ambulatory Visit (INDEPENDENT_AMBULATORY_CARE_PROVIDER_SITE_OTHER): Payer: BC Managed Care – PPO | Admitting: Gastroenterology

## 2020-05-06 ENCOUNTER — Encounter: Payer: Self-pay | Admitting: Gastroenterology

## 2020-05-06 VITALS — BP 138/86 | HR 88 | Ht 64.0 in | Wt 172.2 lb

## 2020-05-06 DIAGNOSIS — K5909 Other constipation: Secondary | ICD-10-CM

## 2020-05-06 DIAGNOSIS — K219 Gastro-esophageal reflux disease without esophagitis: Secondary | ICD-10-CM | POA: Diagnosis not present

## 2020-05-06 MED ORDER — SUCRALFATE 1 G PO TABS: 1.0000 g | ORAL_TABLET | Freq: Four times a day (QID) | ORAL | 1 refills | Status: DC | PRN

## 2020-05-06 NOTE — Patient Instructions (Addendum)
If you are age 51 or older, your body mass index should be between 23-30. Your Body mass index is 29.56 kg/m. If this is out of the aforementioned range listed, please consider follow up with your Primary Care Provider.  If you are age 76 or younger, your body mass index should be between 19-25. Your Body mass index is 29.56 kg/m. If this is out of the aformentioned range listed, please consider follow up with your Primary Care Provider.   You have been scheduled for an endoscopy/bravo. Please follow written instructions given to you at your visit today. If you use inhalers (even only as needed), please bring them with you on the day of your procedure.  STOP your Pantoprazole 5 days prior to your procedure. Dr. Myrtie Neither will send Carafate into your pharmacy to use during this time period. Be sure not to take the Carafate the day of the procedure.  Follow up pending at this time, or as needed.  It was a pleasure to see you today!  Dr. Myrtie Neither

## 2020-05-06 NOTE — Progress Notes (Signed)
[facility] Consult Note:  History: Valerie Fletcher 05/06/2020  Referring provider: Roylene Reason, MD  Reason for consult/chief complaint: Gastroesophageal Reflux (Reflux x years; worsening- has regurgitation at times as well as nausea and vomiting occasionally. ) and Constipation (Can go for "weeks at a time" without bowel movement; no blood noted in stool; no abdominal pain; no OTC medications help.)   Subjective  HPI:  Valerie Fletcher is a 50 year old woman establishing care for chronic digestive symptoms.  She brought some records from a previous practice. Records from GI specialists in [place] indicating chronic reflux symptoms, chronic constipation with multiple medicines tried. Report of toupee fundoplication and pyloroplasty in 06/22/97 Bravo pH study in October 2012 done off medicines, DeMeester score 12.6 EGD December 2017 normal (gastric biopsies that procedure negative for H. pylori) They also describe chronic constipation for which she was treated with MiraLAX, Amitiza and Linzess (without much result according to her).  She has many years of chronic constipation that can go for "weeks at a time without a bowel movement".  Most often she goes 1 or 2 times a week but feels incompletely evacuated.  Sometimes she will use an enema.  She has not noted blood in the stool or abdominal pain, but has bloating related to the constipation.  Her more pressing complaint is chronic severe heartburn.  She says has been present many years, does not get much better with acid suppression, and curiously is okay some days but worse others for unclear reasons.  She denies dysphagia odynophagia nausea or vomiting. Lunden tells me she had a consultation with a GI provider at [facility], and they wanted to perform an EGD, but she was unable to do it because she did not have a care partner.  Lastly, regarding colon cancer screening, Valerie Fletcher said that she had not had a  routine colonoscopy performed because when a colonoscopy was attempted many years ago as work-up for abdominal pain and constipation, they were unable to complete the exam because "her bowels were twisted".  ROS:  Review of Systems  Constitutional: Negative for appetite change and unexpected weight change.  HENT: Negative for mouth sores and voice change.   Eyes: Negative for pain and redness.  Respiratory: Negative for cough and shortness of breath.   Cardiovascular: Negative for chest pain and palpitations.  Genitourinary: Negative for dysuria and hematuria.  Musculoskeletal: Negative for arthralgias and myalgias.  Skin: Negative for pallor and rash.  Allergic/Immunologic: Positive for environmental allergies.  Neurological: Negative for weakness and headaches.  Hematological: Negative for adenopathy.     Past Medical History: Past Medical History:  Diagnosis Date  . Asthma   . GERD (gastroesophageal reflux disease)   . Hypertension      Past Surgical History: Past Surgical History:  Procedure Laterality Date  . DILATION AND CURETTAGE OF UTERUS    . HERNIA REPAIR     Umbilical hernia x2, Hiatial hernia  . TUBAL LIGATION       Family History: Family History  Problem Relation Age of Onset  . Hypertension Mother   . Hypertension Father   . Lung cancer Father   . Hypertension Brother   . Bladder Cancer Maternal Grandmother 83       Passed away from disease 06-22-2005  . Diabetes Maternal Grandmother   . Esophageal cancer Maternal Grandmother   . Pancreatic cancer Paternal Grandmother   . Diabetes Paternal Grandmother   . Prostate cancer Maternal Grandfather   . Lung cancer Paternal  Grandfather   . Kidney cancer Paternal Aunt   . Inflammatory bowel disease Nephew        crohns colitis    Social History: Social History   Socioeconomic History  . Marital status: Single    Spouse name: Not on file  . Number of children: 4  . Years of education: Not on file  .  Highest education level: Associate degree: academic program  Occupational History  . Not on file  Tobacco Use  . Smoking status: Never Smoker  . Smokeless tobacco: Never Used  Vaping Use  . Vaping Use: Never used  Substance and Sexual Activity  . Alcohol use: Yes    Alcohol/week: 4.0 standard drinks    Types: 4 Glasses of wine per week  . Drug use: No  . Sexual activity: Yes    Partners: Male    Birth control/protection: Condom    Comment: s/p tubal ligation  Other Topics Concern  . Not on file  Social History Narrative  . Not on file   Social Determinants of Health   Financial Resource Strain: Not on file  Food Insecurity: Not on file  Transportation Needs: Not on file  Physical Activity: Not on file  Stress: Not on file  Social Connections: Not on file    Allergies: Allergies  Allergen Reactions  . Prednisone Anaphylaxis  . Compazine [Prochlorperazine Edisylate] Other (See Comments)    "locks muscles"  . Dilaudid [Hydromorphone] Itching  . Percocet [Oxycodone-Acetaminophen] Hives and Itching    Outpatient Meds: Current Outpatient Medications  Medication Sig Dispense Refill  . albuterol (PROVENTIL HFA;VENTOLIN HFA) 108 (90 Base) MCG/ACT inhaler Inhale 1 puff into the lungs every 4 (four) hours as needed for wheezing or shortness of breath.    [name] amLODipine (NORVASC) 10 MG tablet TAKE 1 TABLET BY MOUTH EVERYDAY AT BEDTIME 30 tablet 5  . carvedilol (COREG) 25 MG tablet Take 1 tablet (25 mg total) by mouth 2 (two) times daily with a meal. 60 tablet 5  . pantoprazole (PROTONIX) 40 MG tablet Take 1 tablet (40 mg total) by mouth daily. One before breakfast and one before dinner 180 tablet 1   No current facility-administered medications for this visit.      ___________________________________________________________________ Objective   Exam:  BP 138/86   Pulse 88   Ht 5\' 4"  (1.626 m)   Wt 172 lb 3.2 oz (78.1 kg)   BMI 29.56 kg/m  Wt Readings from Last 3  Encounters:  05/06/20 172 lb 3.2 oz (78.1 kg)  03/17/20 180 lb 1.6 oz (81.7 kg)  02/25/20 167 lb (75.8 kg)     General: Well-appearing, normal vocal quality, pleasant and conversational  Eyes: sclera anicteric, no redness  ENT: oral mucosa moist without lesions, no cervical or supraclavicular lymphadenopathy  CV: RRR without murmur, S1/S2, no JVD, no peripheral edema  Resp: clear to auscultation bilaterally, normal RR and effort noted  GI: soft, mild lower tenderness, with active bowel sounds. No guarding or palpable organomegaly noted.  Skin; warm and dry, no rash or jaundice noted  Neuro: awake, alert and oriented x 3. Normal gross motor function and fluent speech  Labs:  Records as noted above  Assessment: Encounter Diagnoses  Name Primary?  . Gastroesophageal reflux disease, unspecified whether esophagitis present Yes  . Chronic constipation     Decades of reflux with fundoplication many years ago and ongoing heartburn despite acid suppression therapy.  Records above indicate this patient had no significant reflux detected on a  wireless pH study done while off acid suppression therapy nearly a decade ago.  It suggest that at least at that time she had a probable component of functional heartburn, which may still be the case.  Status of her fundoplication is currently unknown.  Severe chronic constipation, has not responded well to multiple OTC and some prescription meds.  I had hoped to give her samples of Motegrity, but did not currently have any.  We can check, but insurance usually requires documented trials of multiple medicines before Motegrity might be approved.  I asked if she would like me to attempt a screening colonoscopy, but she did not feel ready for that and would like to attend to her upper digestive symptoms first.  Plan:  Upper endoscopy with wireless pH device placement, thus with patient off her pantoprazole 5 days prior to procedure.  I prescribed  Carafate 1 g tablets, which she can dissolve and 2 tablespoons of warm water for a slurry, and use it 3-4 times a day as needed during the time off acid suppression medicine, do not take any the day of procedure.  Thank you for the courtesy of this consult.  Please call me with any questions or concerns.  Charlie Pitter III  CC: Referring provider noted above

## 2020-07-06 ENCOUNTER — Encounter: Payer: Self-pay | Admitting: Gastroenterology

## 2020-07-06 ENCOUNTER — Ambulatory Visit (AMBULATORY_SURGERY_CENTER): Payer: BC Managed Care – PPO | Admitting: Gastroenterology

## 2020-07-06 ENCOUNTER — Other Ambulatory Visit: Payer: Self-pay

## 2020-07-06 VITALS — BP 112/79 | HR 82 | Temp 97.7°F | Resp 16 | Ht 64.0 in | Wt 172.0 lb

## 2020-07-06 DIAGNOSIS — K259 Gastric ulcer, unspecified as acute or chronic, without hemorrhage or perforation: Secondary | ICD-10-CM

## 2020-07-06 DIAGNOSIS — K219 Gastro-esophageal reflux disease without esophagitis: Secondary | ICD-10-CM | POA: Diagnosis not present

## 2020-07-06 DIAGNOSIS — R12 Heartburn: Secondary | ICD-10-CM

## 2020-07-06 MED ORDER — SODIUM CHLORIDE 0.9 % IV SOLN
500.0000 mL | Freq: Once | INTRAVENOUS | Status: DC
Start: 2020-07-06 — End: 2020-12-03

## 2020-07-06 NOTE — Progress Notes (Signed)
Capsule expiration date-07/26/21  Capsule ID number [id number]  LES measurement: 36 (capsule placed 6 cm above LES) 30  Time of implant: 1024

## 2020-07-06 NOTE — Progress Notes (Signed)
Called to room to assist during endoscopic procedure.  Patient ID and intended procedure confirmed with present staff. Received instructions for my participation in the procedure from the performing physician.  

## 2020-07-06 NOTE — Progress Notes (Signed)
VS by CW  I have reviewed the patient's medical history in detail and updated the computerized patient record.  BRAVO device and diary education provided by Huntley Dec Monday, RN.

## 2020-07-06 NOTE — Progress Notes (Signed)
To PACU, VSS. Report to [email or web address] 

## 2020-07-06 NOTE — Op Note (Signed)
[facility] Patient Name: Valerie Fletcher Procedure Date: 07/06/2020 10:01 AM MRN: [id number] Endoscopist: Sherilyn Cooter L. Myrtie Neither , MD Age: 50 Referring MD:  Date of Birth: 14-Nov-1970 Gender: Female Account #: [id number] Procedure:                Upper GI endoscopy Indications:              Heartburn (recent office note with clinical                            details) - prior fundoplication and pyloroplasty                           H pylori negative on 2017 EGD at outside clinic                           Patient has been off PPI 5 days before this                            procedure. Medicines:                Monitored Anesthesia Care Procedure:                Pre-Anesthesia Assessment:                           - Prior to the procedure, a History and Physical                            was performed, and patient medications and                            allergies were reviewed. The patient's tolerance of                            previous anesthesia was also reviewed. The risks                            and benefits of the procedure and the sedation                            options and risks were discussed with the patient.                            All questions were answered, and informed consent                            was obtained. Prior Anticoagulants: The patient has                            taken no previous anticoagulant or antiplatelet                            agents. ASA Grade Assessment: II - A patient with  mild systemic disease. After reviewing the risks                            and benefits, the patient was deemed in                            satisfactory condition to undergo the procedure.                           After obtaining informed consent, the endoscope was                            passed under direct vision. Throughout the                            procedure, the patient's blood pressure, pulse, and                             oxygen saturations were monitored continuously. The                            Endoscope was introduced through the mouth, and                            advanced to the second part of duodenum. The upper                            GI endoscopy was accomplished without difficulty.                            The patient tolerated the procedure well. Scope In: Scope Out: Findings:                 The examined esophagus was normal. The BRAVO                            capsule with delivery system was introduced through                            the mouth and advanced into the esophagus, such                            that the BRAVO pH capsule was positioned 30 cm from                            the incisors, which was 6 cm proximal to the GE                            junction. The BRAVO pH capsule was then deployed                            and attached to the esophageal mucosa. The delivery  system was then withdrawn. Endoscopy was utilized                            for probe placement and diagnostic evaluation.                            Scope reinserted to check placement.                           Evidence of a partial fundoplication was found in                            the cardia. The wrap appeared loose and with a                            small para-esophageal hernia.                           One non-bleeding superficial gastric ulcer with no                            stigmata of bleeding was found in the cardia. The                            lesion was 5 mm in largest dimension. (this was                            adjacent to the funoplication changes)                           Evidence of a pyloroplasty was found in the pylorus                            (patulous). This was characterized by healthy                            appearing mucosa.                           The exam of the stomach was otherwise normal. It                             had a somewhat tubular shape but without evidence                            of gastric sleeve surgery.                           The examined duodenum was normal. Complications:            No immediate complications. Estimated Blood Loss:     Estimated blood loss: none. Impression:               - Normal esophagus.                           -  A partial fundoplication was found. The wrap                            appears loose.                           - Non-bleeding gastric ulcer with no stigmata of                            bleeding. Most likely related to adjacent surgical                            change and mild herniation of upper stomach.                           - A pyloroplasty was found, characterized by                            healthy appearing mucosa.                           - Normal examined duodenum.                           - The BRAVO pH capsule was deployed.                           - No specimens collected. Recommendation:           - Patient has a contact number available for                            emergencies. The signs and symptoms of potential                            delayed complications were discussed with the                            patient. Return to normal activities tomorrow.                            Written discharge instructions were provided to the                            patient.                           - Resume previous diet.                           - Resume pantoprazole at previous dose in 48 hours. Dawud Mays L. Myrtie Neitheranis, MD 07/06/2020 10:40:12 AM This report has been signed electronically.

## 2020-07-06 NOTE — Patient Instructions (Signed)
Follow Bravo capsule instructions.  Resume Pantoprazole at regular dose in 48 hours.   YOU HAD AN ENDOSCOPIC PROCEDURE TODAY AT THE [facility]:   Refer to the procedure report that was given to you for any specific questions about what was found during the examination.  If the procedure report does not answer your questions, please call your gastroenterologist to clarify.  If you requested that your care partner not be given the details of your procedure findings, then the procedure report has been included in a sealed envelope for you to review at your convenience later.  YOU SHOULD EXPECT: Some feelings of bloating in the abdomen. Passage of more gas than usual.  Walking can help get rid of the air that was put into your GI tract during the procedure and reduce the bloating. If you had a lower endoscopy (such as a colonoscopy or flexible sigmoidoscopy) you may notice spotting of blood in your stool or on the toilet paper. If you underwent a bowel prep for your procedure, you may not have a normal bowel movement for a few days.  Please Note:  You might notice some irritation and congestion in your nose or some drainage.  This is from the oxygen used during your procedure.  There is no need for concern and it should clear up in a day or so.  SYMPTOMS TO REPORT IMMEDIATELY:   Following upper endoscopy (EGD)  Vomiting of blood or coffee ground material  New chest pain or pain under the shoulder blades  Painful or persistently difficult swallowing  New shortness of breath  Fever of 100F or higher  Black, tarry-looking stools  For urgent or emergent issues, a gastroenterologist can be reached at any hour by calling [phone]. Do not use MyChart messaging for urgent concerns.    DIET:  We do recommend a small meal at first, but then you may proceed to your regular diet.  Drink plenty of fluids but you should avoid alcoholic beverages for 24 hours.  ACTIVITY:  You should plan  to take it easy for the rest of today and you should NOT DRIVE or use heavy machinery until tomorrow (because of the sedation medicines used during the test).    FOLLOW UP: Our staff will call the number listed on your records 48-72 hours following your procedure to check on you and address any questions or concerns that you may have regarding the information given to you following your procedure. If we do not reach you, we will leave a message.  We will attempt to reach you two times.  During this call, we will ask if you have developed any symptoms of COVID 19. If you develop any symptoms (ie: fever, flu-like symptoms, shortness of breath, cough etc.) before then, please call [phone].  If you test positive for Covid 19 in the 2 weeks post procedure, please call and report this information to [place].    If any biopsies were taken you will be contacted by phone or by letter within the next 1-3 weeks.  Please call us at [phone] if you have not heard about the biopsies in 3 weeks.    SIGNATURES/CONFIDENTIALITY: You and/or your care partner have signed paperwork which will be entered into your electronic medical record.  These signatures attest to the fact that that the information above on your After Visit Summary has been reviewed and is understood.  Full responsibility of the confidentiality of this discharge information lies with you and/or your  care-partner.   Post-op Bravo pH instructions Once you get home:  Eat normally and go about your daily routine/activities Limit drinking fluids or eating between meals Do not chew gum or eat hard candy DO NOT take any antacid or anti-reflux medications during the 48-hour monitoring time, unless instructed by your physician  Recording events: Events to be recorded are:  Record using event buttons on recorder and write on paper diary form 1. Every time you eat or drink something (other than water) 2.   Periods of lying down/reclining 3.   Symptoms:  may include heartburn, regurgitation, chest pain, cough or specify if other.  A paper diary is also provided to record the times of your reflux symptoms and times for meals and when you lie down.  The recorder needs to remain within 3 feet (arms length) of you during the testing period (48 hours). If you should forget and move outside of a 3-foot radius of the receiver you may hear beeping and you will see a "C1" error in the display window on the top of the receiver.  Please pick up the receiver and hold close to you to re-establish the connection and the error message disappears.  You may take a bath/shower during the testing period, but the recorder must not get wet and must remain within 3 feet of you. Please leave the receiver outside of the shower or tub while bathing. The monitoring period will be for 48 hours after placement of the capsule.  At the end of the 48 hours, you will return the recorder, and your diary, to our [facility] front desk.  A nurse will meet you to collect the device and answer any questions you may have.  The device should turn off once the 48 hours is complete.   What to expect after placement of the capsule:  Some patients experience a vague sensation that something is in their esophagus or that they 'feel' the capsule when they swallow food.  Should you experience this, chewing food carefully or drinking liquids may minimize this sensation.   After the test is complete, the disposable capsule will fall off the wall of your esophagus within 5-10 days and pass naturally with your bowel movement through the digestive tract.  Once the recorder is returned, your provider will review and interpret your recordings and contact you to discuss your results.  This may take up to two weeks.   DO NOT have an MRI for 30 days after your procedure to ensure the capsule is no longer inside your body  It is imperative that you return the recorder on  _________________________ by 3:00pm.  Your information must be downloaded at this time to obtain your results.

## 2020-07-08 ENCOUNTER — Telehealth: Payer: Self-pay | Admitting: *Deleted

## 2020-07-08 NOTE — Telephone Encounter (Signed)
  Follow up Call-  Call back number 07/06/2020  Post procedure Call Back phone  # [phone]  Permission to leave phone message Yes  Some recent data might be hidden     Patient questions:  Do you have a fever, pain , or abdominal swelling? No. Pain Score  0 *  Have you tolerated food without any problems? Yes.    Have you been able to return to your normal activities? Yes.    Do you have any questions about your discharge instructions: Diet   No. Medications  No. Follow up visit  No.  Do you have questions or concerns about your Care? No.  Actions: * If pain score is 4 or above: No action needed, pain <4.  1. Have you developed a fever since your procedure? no  2.   Have you had an respiratory symptoms (SOB or cough) since your procedure? no  3.   Have you tested positive for COVID 19 since your procedure no  4.   Have you had any family members/close contacts diagnosed with the COVID 19 since your procedure?  no   If yes to any of these questions please route to Laverna Peace, RN and Karlton Lemon, RN

## 2020-07-08 NOTE — Telephone Encounter (Signed)
  Follow up Call-  Call back number 07/06/2020  Post procedure Call Back phone  # [phone]  Permission to leave phone message Yes  Some recent data might be hidden    [facility]

## 2020-08-01 ENCOUNTER — Encounter: Payer: Self-pay | Admitting: *Deleted

## 2020-08-31 ENCOUNTER — Encounter: Payer: Self-pay | Admitting: *Deleted

## 2020-10-05 ENCOUNTER — Ambulatory Visit (INDEPENDENT_AMBULATORY_CARE_PROVIDER_SITE_OTHER): Payer: BC Managed Care – PPO | Admitting: Gastroenterology

## 2020-10-05 ENCOUNTER — Encounter: Payer: Self-pay | Admitting: Gastroenterology

## 2020-10-05 VITALS — BP 120/70 | HR 86 | Ht 64.0 in | Wt 172.6 lb

## 2020-10-05 DIAGNOSIS — K219 Gastro-esophageal reflux disease without esophagitis: Secondary | ICD-10-CM

## 2020-10-05 DIAGNOSIS — K5909 Other constipation: Secondary | ICD-10-CM | POA: Diagnosis not present

## 2020-10-05 MED ORDER — MOTEGRITY 2 MG PO TABS: 2.0000 mg | ORAL_TABLET | Freq: Every day | ORAL | 1 refills | Status: DC

## 2020-10-05 NOTE — Progress Notes (Signed)
[facility] Progress Note  Chief Complaint: Chronic constipation  Subjective  History: Valerie Fletcher was seen for initial office consult 05/06/2020, extensive GI history outlined in that note.  Patient requested further evaluation of longstanding reflux symptoms with persistent heartburn despite prior fundoplication.  She also had longstanding severe chronic constipation without much improvement on multiple prior meds indicated in that note.  Patient was offered screening colonoscopy, but wanted to attend to that after her upper digestive issues. EGD 07/06/2020 with evidence of prior fundoplication that appeared somewhat loose (was reportedly a toupee procedure), and pyloroplasty.  Stomach was also somewhat tubular in shape, though without history or endoscopic evidence of gastric sleeve procedure. pH study showed mild reflux that occurred primarily supine, but did not appear to completely explain patient's symptoms.  I sent her a lengthy note through MyChart on 07/16/2020.  Valerie Fletcher tells me her heartburn is generally under good control.  She takes pantoprazole typically once daily and the Carafate every few days as needed.  Her symptoms bother her most laying down, so I advised her to elevate head while sleeping using a bed wedge or mechanical bed if possible.  She wanted to revisit her chronic constipation, which continues to be a significant problem for decades.  Multiple medicines have not worked in the past as outlined in previous note.  On average she has a BM about every 7 or 8 days and even then does not feel well evacuated. She tells me that many years ago she had good effect from Zelnorm, and was distressed when it was removed from the market.  Prior GI physician then prescribed it for her when it was back on the market a few years ago, but she was unable to take it due to its cost.  She does not recall having any side effects or other troubles with the Zelnorm  ROS: Cardiovascular:  no  chest pain Respiratory: no dyspnea  The patient's Past Medical, Family and Social History were reviewed and are on file in the EMR.  Objective:  Med list reviewed  Current Outpatient Medications:    albuterol (PROVENTIL HFA;VENTOLIN HFA) 108 (90 Base) MCG/ACT inhaler, Inhale 1 puff into the lungs every 4 (four) hours as needed for wheezing or shortness of breath., Disp: , Rfl:    amLODipine (NORVASC) 10 MG tablet, TAKE 1 TABLET BY MOUTH EVERYDAY AT BEDTIME, Disp: 30 tablet, Rfl: 5   carvedilol (COREG) 25 MG tablet, Take 1 tablet (25 mg total) by mouth 2 (two) times daily with a meal., Disp: 60 tablet, Rfl: 5   pantoprazole (PROTONIX) 40 MG tablet, Take 1 tablet (40 mg total) by mouth daily. One before breakfast and one before dinner, Disp: 180 tablet, Rfl: 1   Prucalopride Succinate (MOTEGRITY) 2 MG TABS, Take 1 tablet (2 mg total) by mouth daily., Disp: 30 tablet, Rfl: 1   sucralfate (CARAFATE) 1 g tablet, Take 1 tablet (1 g total) by mouth every 6 (six) hours as needed. Dissolve tablet  in 30cc warm water, Disp: 60 tablet, Rfl: 1  Current Facility-Administered Medications:    0.9 %  sodium chloride infusion, 500 mL, Intravenous, Once, Danis, Starr Lake III, MD   Vital signs in last 24 hrs: Vitals:   10/05/20 1514  BP: 120/70  Pulse: 86  SpO2: 99%   Wt Readings from Last 3 Encounters:  10/05/20 172 lb 9.6 oz (78.3 kg)  07/06/20 172 lb (78 kg)  05/06/20 172 lb 3.2 oz (78.1 kg)  Physical Exam  Well-appearing HEENT: sclera anicteric, oral mucosa moist without lesions Neck: supple, no thyromegaly, JVD or lymphadenopathy Cardiac: RRR without murmurs, S1S2 heard, no peripheral edema Pulm: clear to auscultation bilaterally, normal RR and effort noted Abdomen: soft, no tenderness, with active bowel sounds. No guarding or palpable hepatosplenomegaly. Skin; warm and dry, no jaundice or rash  Labs:   ___________________________________________ Radiologic  studies:   ____________________________________________ Other:   _____________________________________________ Assessment & Plan  Assessment: Encounter Diagnoses  Name Primary?   Gastroesophageal reflux disease without esophagitis Yes   Chronic constipation    Longstanding GERD without esophagitis, persistent heartburn and dysphagia after prior fundoplication.  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  She is doing well on current regimen.  Decades of chronic idiopathic constipation.  Zelnorm or Motegrity would be right for her if insurance will approve. Would like to try medicine like this to relieve her constipation if possible before attempting colonoscopy preparation.  She reports having had a poor preparation in the past and therefore incomplete exam.  Plan: Motegrity 2 mg once daily.  Prior authorization may be needed. With hopeful relief of constipation from that, we can then plan for screening colonoscopy.  I suspect she will need more extensive bowel preparation based on her history.  Charlie Pitter III

## 2020-10-05 NOTE — Patient Instructions (Signed)
If you are age 51 or older, your body mass index should be between 23-30. Your Body mass index is 29.63 kg/m. If this is out of the aforementioned range listed, please consider follow up with your Primary Care Provider.  If you are age 45 or younger, your body mass index should be between 19-25. Your Body mass index is 29.63 kg/m. If this is out of the aformentioned range listed, please consider follow up with your Primary Care Provider.   __________________________________________________________  The [facility] providers would like to encourage you to use [facility] to communicate with providers for non-urgent requests or questions.  Due to long hold times on the telephone, sending your provider a message by [facility] may be a faster and more efficient way to get a response.  Please allow 48 business hours for a response.  Please remember that this is for non-urgent requests.    It was a pleasure to see you today!  Thank you for trusting me with your gastrointestinal care!

## 2020-11-24 ENCOUNTER — Other Ambulatory Visit: Payer: Self-pay

## 2020-11-24 ENCOUNTER — Encounter: Payer: Self-pay | Admitting: Internal Medicine

## 2020-11-24 ENCOUNTER — Ambulatory Visit (INDEPENDENT_AMBULATORY_CARE_PROVIDER_SITE_OTHER): Payer: BC Managed Care – PPO | Admitting: Internal Medicine

## 2020-11-24 VITALS — BP 131/85 | HR 80 | Temp 98.1°F | Ht 64.0 in | Wt 176.0 lb

## 2020-11-24 DIAGNOSIS — E785 Hyperlipidemia, unspecified: Secondary | ICD-10-CM | POA: Diagnosis not present

## 2020-11-24 DIAGNOSIS — M62838 Other muscle spasm: Secondary | ICD-10-CM | POA: Diagnosis not present

## 2020-11-24 DIAGNOSIS — K219 Gastro-esophageal reflux disease without esophagitis: Secondary | ICD-10-CM

## 2020-11-24 DIAGNOSIS — J4520 Mild intermittent asthma, uncomplicated: Secondary | ICD-10-CM

## 2020-11-24 DIAGNOSIS — I10 Essential (primary) hypertension: Secondary | ICD-10-CM

## 2020-11-24 DIAGNOSIS — R002 Palpitations: Secondary | ICD-10-CM

## 2020-11-24 DIAGNOSIS — Z299 Encounter for prophylactic measures, unspecified: Secondary | ICD-10-CM | POA: Diagnosis not present

## 2020-11-24 NOTE — Patient Instructions (Addendum)
[name], it was a pleasure seeing you today!  Today we discussed: You stated you were doing great! Please continue your current medications. Continue to follow with GI for GERD and colonoscopy. Schedule your mammogram and call us to schedule your pap smear when you are ready as you did not want it today. We will check some labs listed below and call you with the results. After that we can see if we need to restart a statin or not. Please go to your pharmacy for COVID, and Shingles shot.   I have ordered the following labs today:  Lab Orders         BMP8+Anion Gap         Lipid Profile         Magnesium      Referrals ordered today:   Mammogram referral placed.    I have ordered the following medication/changed the following medications:   Stop the following medications: There are no discontinued medications.   Start the following medications: No orders of the defined types were placed in this encounter.    Follow-up: Schedule pap smear with next visit  Please make sure to arrive 15 minutes prior to your next appointment. If you arrive late, you may be asked to reschedule.   We look forward to seeing you next time. Please call our clinic at [phone] if you have any questions or concerns. The best time to call is Monday-Friday from 9am-4pm, but there is someone available 24/7. If after hours or the weekend, call the main hospital number and ask for the Internal Medicine Resident On-Call. If you need medication refills, please notify your pharmacy one week in advance and they will send [place] a request.  Thank you for letting us take part in your care. Wishing you the best!  Thank you, Gwenevere Abbot, MD

## 2020-11-25 ENCOUNTER — Encounter: Payer: Self-pay | Admitting: Internal Medicine

## 2020-11-25 DIAGNOSIS — Z299 Encounter for prophylactic measures, unspecified: Secondary | ICD-10-CM | POA: Insufficient documentation

## 2020-11-25 DIAGNOSIS — M62838 Other muscle spasm: Secondary | ICD-10-CM | POA: Insufficient documentation

## 2020-11-25 LAB — BMP8+ANION GAP
Anion Gap: 17 mmol/L (ref 10.0–18.0)
BUN/Creatinine Ratio: 23 (ref 9–23)
BUN: 21 mg/dL (ref 6–24)
CO2: 21 mmol/L (ref 20–29)
Calcium: 10 mg/dL (ref 8.7–10.2)
Chloride: 99 mmol/L (ref 96–106)
Creatinine, Ser: 0.9 mg/dL (ref 0.57–1.00)
Glucose: 106 mg/dL — ABNORMAL HIGH (ref 70–99)
Potassium: 4.2 mmol/L (ref 3.5–5.2)
Sodium: 137 mmol/L (ref 134–144)
eGFR: 78 mL/min/{1.73_m2} (ref >59)

## 2020-11-25 LAB — LIPID PANEL
Chol/HDL Ratio: 6.1 ratio — ABNORMAL HIGH (ref 0.0–4.4)
Cholesterol, Total: 300 mg/dL — ABNORMAL HIGH (ref 100–199)
HDL: 49 mg/dL (ref >39)
LDL Chol Calc (NIH): 209 mg/dL — ABNORMAL HIGH (ref 0–99)
Triglycerides: 214 mg/dL — ABNORMAL HIGH (ref 0–149)
VLDL Cholesterol Cal: 42 mg/dL — ABNORMAL HIGH (ref 5–40)

## 2020-11-25 LAB — HEPATITIS C ANTIBODY: Hep C Virus Ab: <0.1 s/co ratio (ref 0.0–0.9)

## 2020-11-25 LAB — MAGNESIUM: Magnesium: 2 mg/dL (ref 1.6–2.3)

## 2020-11-25 MED ORDER — AMLODIPINE BESYLATE 10 MG PO TABS: 10.0000 mg | ORAL_TABLET | Freq: Every day | ORAL | 3 refills | Status: DC

## 2020-11-25 MED ORDER — PANTOPRAZOLE SODIUM 40 MG PO TBEC: 40.0000 mg | DELAYED_RELEASE_TABLET | Freq: Every day | ORAL | 3 refills | Status: DC

## 2020-11-25 MED ORDER — CARVEDILOL 25 MG PO TABS: 25.0000 mg | ORAL_TABLET | Freq: Two times a day (BID) | ORAL | 3 refills | Status: DC

## 2020-11-25 NOTE — Assessment & Plan Note (Signed)
Assessment: Patient endorsed having two back muscle spasm in the last two weeks that resolve within couple of days. They usually occur with rapid movements. She is currently asymptomatic. She reported history of magnesium deficiency and is on PPI which can lead to hypomagnesemia causing muscle spasms.   Plan: -Check Mag level -Replete if necessary -Follow up if muscle spasm worsen

## 2020-11-25 NOTE — Progress Notes (Signed)
   CC: follow up  HPI:  [name] is a 50 y.o. with medical history as below presenting to [facility] for follow up.   Please see problem-based list for further details, assessments, and plans.  Past Medical History:  Diagnosis Date   Asthma    Constipation    Fatigue 02/14/2017   GERD (gastroesophageal reflux disease)    Hypertension    Vaginal candidiasis 10/05/2017   Viral upper respiratory tract infection 05/01/2018   Review of Systems:  Review of system negative unless stated in the problem list or HPI.    Physical Exam:  Vitals:   11/24/20 1514  BP: 131/85  Pulse: 80  Temp: 98.1 F (36.7 C)  TempSrc: Oral  SpO2: 100%  Weight: 176 lb (79.8 kg)  Height: 5\' 4"  (1.626 m)    Physical Exam Constitutional:      General: She is not in acute distress.    Appearance: Normal appearance. She is not ill-appearing.  HENT:     Head: Normocephalic and atraumatic.     Right Ear: External ear normal.     Left Ear: External ear normal.     Nose: Nose normal.     Mouth/Throat:     Mouth: Mucous membranes are moist.     Pharynx: Oropharynx is clear.  Eyes:     Extraocular Movements: Extraocular movements intact.     Conjunctiva/sclera: Conjunctivae normal.     Pupils: Pupils are equal, round, and reactive to light.  Cardiovascular:     Rate and Rhythm: Normal rate and regular rhythm.     Pulses: Normal pulses.     Heart sounds: Normal heart sounds. No murmur heard.   No friction rub. No gallop.  Pulmonary:     Effort: Pulmonary effort is normal. No respiratory distress.     Breath sounds: Normal breath sounds. No stridor. No wheezing or rhonchi.  Abdominal:     General: Bowel sounds are normal. There is no distension.     Palpations: Abdomen is soft. There is no mass.  Musculoskeletal:        General: No swelling or tenderness. Normal range of motion.     Cervical back: Normal range of motion and neck supple.  Skin:    Capillary Refill: Capillary refill takes less than  2 seconds.     Coloration: Skin is not jaundiced.     Findings: No erythema, lesion or rash.  Neurological:     General: No focal deficit present.     Mental Status: She is alert and oriented to person, place, and time. Mental status is at baseline.  Psychiatric:        Mood and Affect: Mood normal.        Behavior: Behavior normal.    Assessment & Plan:   See Encounters Tab for problem based charting.  Patient seen with Dr. Dr. , MD

## 2020-11-25 NOTE — Assessment & Plan Note (Signed)
Assessment: Patient palpitations are well controlled with Coreg 25 mg BID. She reported one episode in the past month. Her previous work up showed she had sinus tachycardia.  Plan: -Continue Coreg 25 mg BID

## 2020-11-25 NOTE — Assessment & Plan Note (Signed)
Assessment: Patient has GERD that is well controlled on Protonix 40 mg daily. She followed with GI due to her history of hiatal hernia and they prescribed her Carafate which she states is helping her. They prescribed M[employer] but patient's insurance has not covered it.   Plan: -Continue Protonix 40 mg daily -Carafate 1g daily -Continue to follow GI

## 2020-11-25 NOTE — Progress Notes (Signed)
Internal Medicine Clinic Attending  I saw and evaluated the patient.  I personally confirmed the key portions of the history and exam documented by Dr. Welton Flakes and I reviewed pertinent patient test results.  The assessment, diagnosis, and plan were formulated together and I agree with the documentation in the resident's note.   Dickie La, MD

## 2020-11-25 NOTE — Assessment & Plan Note (Signed)
Assessment: Patient has history of hyperlipidemia but is not taking Crestor 20 mg. Her last lipid panel 2 years ago was within normal limits. We will repeat lipid panel and see if she needs to continue a statin.  Plan: -Repeat lipid panel -Restart statin if indicated by repeat labs

## 2020-11-25 NOTE — Assessment & Plan Note (Signed)
Assessment:  Patient has htn that was uncontrolled on multiple medications. She is currently taking Coreg 25 mg BID, and Norvasc 10 mg qd. She was controlled today and endorsed better control at home since March. She stated she was running less than 130/90 at home after changing her diet.   Plan: -Continue Coreg 25 mg BID and Norvasc 10 mg qd -Provided information on DASH diet and gave BP log -Repeat BMP

## 2020-11-25 NOTE — Assessment & Plan Note (Signed)
-  Hep C testing today -Mammogram referral today -Following GI for colonoscopy -Agreed to getting Shingrix and COVID booster -Denied pap smear at this time

## 2020-11-25 NOTE — Assessment & Plan Note (Signed)
Assessment: Well controlled, has used inhaler once this year.   Plan: -Continue to keep albuterol as needed for asthma

## 2020-12-03 ENCOUNTER — Other Ambulatory Visit: Payer: Self-pay | Admitting: Student

## 2020-12-03 DIAGNOSIS — K219 Gastro-esophageal reflux disease without esophagitis: Secondary | ICD-10-CM

## 2020-12-03 DIAGNOSIS — I10 Essential (primary) hypertension: Secondary | ICD-10-CM

## 2020-12-03 MED ORDER — PANTOPRAZOLE SODIUM 40 MG PO TBEC: 40.0000 mg | DELAYED_RELEASE_TABLET | Freq: Every day | ORAL | 3 refills | Status: AC

## 2020-12-03 MED ORDER — AMLODIPINE BESYLATE 10 MG PO TABS: 10.0000 mg | ORAL_TABLET | Freq: Every day | ORAL | 3 refills | Status: AC

## 2020-12-03 MED ORDER — CARVEDILOL 25 MG PO TABS
25.0000 mg | ORAL_TABLET | Freq: Two times a day (BID) | ORAL | 3 refills | Status: AC
Start: 2020-12-03 — End: 2023-01-09

## 2021-01-13 ENCOUNTER — Ambulatory Visit
Admission: RE | Admit: 2021-01-13 | Discharge: 2021-01-13 | Disposition: A | Discharge status: Home / Self Care | Payer: BC Managed Care – PPO | Source: Ambulatory Visit | Attending: Internal Medicine | Admitting: Internal Medicine

## 2021-01-13 ENCOUNTER — Other Ambulatory Visit: Payer: Self-pay

## 2021-01-13 DIAGNOSIS — Z299 Encounter for prophylactic measures, unspecified: Secondary | ICD-10-CM

## 2021-01-13 DIAGNOSIS — Z1231 Encounter for screening mammogram for malignant neoplasm of breast: Secondary | ICD-10-CM | POA: Diagnosis not present

## 2021-01-26 ENCOUNTER — Other Ambulatory Visit: Payer: Self-pay | Admitting: Internal Medicine

## 2021-01-26 DIAGNOSIS — R928 Other abnormal and inconclusive findings on diagnostic imaging of breast: Secondary | ICD-10-CM

## 2021-03-02 ENCOUNTER — Other Ambulatory Visit: Payer: Self-pay | Admitting: Internal Medicine

## 2021-03-02 ENCOUNTER — Ambulatory Visit
Admission: RE | Admit: 2021-03-02 | Discharge: 2021-03-02 | Disposition: A | Discharge status: Home / Self Care | Payer: BC Managed Care – PPO | Source: Ambulatory Visit | Attending: Internal Medicine | Admitting: Internal Medicine

## 2021-03-02 DIAGNOSIS — R928 Other abnormal and inconclusive findings on diagnostic imaging of breast: Secondary | ICD-10-CM

## 2021-03-02 DIAGNOSIS — N6310 Unspecified lump in the right breast, unspecified quadrant: Secondary | ICD-10-CM

## 2021-03-02 DIAGNOSIS — R922 Inconclusive mammogram: Secondary | ICD-10-CM | POA: Diagnosis not present

## 2021-03-31 ENCOUNTER — Encounter: Payer: Self-pay | Admitting: Student

## 2021-03-31 ENCOUNTER — Ambulatory Visit (INDEPENDENT_AMBULATORY_CARE_PROVIDER_SITE_OTHER): Payer: BC Managed Care – PPO | Admitting: Student

## 2021-03-31 DIAGNOSIS — U071 COVID-19: Secondary | ICD-10-CM

## 2021-03-31 MED ORDER — NIRMATRELVIR/RITONAVIR (PAXLOVID)TABLET: 3.0000 | ORAL_TABLET | Freq: Two times a day (BID) | ORAL | 0 refills | Status: DC

## 2021-03-31 MED ORDER — NIRMATRELVIR/RITONAVIR (PAXLOVID)TABLET: 3.0000 | ORAL_TABLET | Freq: Two times a day (BID) | ORAL | 0 refills | Status: AC

## 2021-03-31 NOTE — Progress Notes (Signed)
° °  CC:   This is a telephone encounter between CARLETTE PALMATIER and Steffanie Rainwater on 03/31/2021 for evaluation of her positive COVID test. The visit was conducted with the patient located at home and Steffanie Rainwater at [facility]. The patient's identity was confirmed using their DOB and current address. The patient has consented to being evaluated through a telephone encounter and understands the associated risks (an examination cannot be done and the patient may need to come in for an appointment) / benefits (allows the patient to remain at home, decreasing exposure to coronavirus). I personally spent 15 minutes on medical discussion.   HPI:  Ms.Alexina C Dolle is a 51 y.o. with PMH as below.   Please see A&P for assessment of the patient's acute and chronic medical conditions.   Past Medical History:  Diagnosis Date   Asthma    Constipation    Fatigue 02/14/2017   GERD (gastroesophageal reflux disease)    Hypertension    Iron deficiency anemia 04/08/2018   Vaginal candidiasis 10/05/2017   Vertigo 11/20/2018   Viral upper respiratory tract infection 05/01/2018   Review of Systems:    Positive for generalized malaise, fever, dry cough, body aches, nausea, sore throat and fatigue. Negative for chest pain, shortness of breath, headache or chest congestion    Assessment & Plan:   See Encounters Tab for problem based charting.  Patient discussed with Dr. Kelby Aline, MD, MPH

## 2021-03-31 NOTE — Assessment & Plan Note (Signed)
Patient with a history of mild intermittent asthma evaluated via telephone for testing positive for COVID.  Patient reports that she started having some sore throat 2 days ago but yesterday she started feeling worse with generalized malaise and fever.  She has also had associated dry cough, body aches, runny nose, nausea, chills and fatigue but denies any loss of smell/taste, headaches, shortness of breath, chest pain, diarrhea or abdominal pain.  No known exposure at home.  Her home COVID test was positive yesterday.  She lives with her 3 sons and they do not show any symptoms.  She has had 2 Pfizer vaccines plus the booster.  She has not been vaccinated against the flu.  She has been taking NyQuil and DayQuil to help with symptomatic relief.  Patient has mild to moderate COVID symptoms and is currently within the 5-day window where antiviral agent will be effective in decreasing symptom burden.  She does not have any hepatic or renal dysfunction.  Plan: -- Start Paxlovid twice daily for 5 days -- Advised to continue OTC cold and cough medications -- Advised to stay hydrated -- Advised to isolate for 10 days but can end isolation after 5 days if symptoms are improving and she has been afebrile for 24 hr without using any fever reducing medications. -- Advised to call clinic if symptoms worsen or does not improve after isolation

## 2021-03-31 NOTE — Progress Notes (Signed)
Internal Medicine Clinic Attending ° °Case discussed with Dr. Amponsah  At the time of the visit.  We reviewed the resident’s history and pertinent patient test results.  I agree with the assessment, diagnosis, and plan of care documented in the resident’s note.  °

## 2021-03-31 NOTE — Patient Instructions (Signed)
Thank you, [name] for allowing [place] to provide your care today. Today, we discussed your positive COVID tes, recommendations for treatments and isolation.    I have ordered the following medication/changed the following medications:  Paxlovid twice daily for 5 days.  My Chart Access: [email or web address]  Please follow-up as needed if symptoms worsen  Please make sure to arrive 15 minutes prior to your next appointment. If you arrive late, you may be asked to reschedule.    We look forward to seeing you next time. Please call our clinic at [phone] if you have any questions or concerns. The best time to call is Monday-Friday from 9am-4pm, but there is someone available 24/7. If after hours or the weekend, call the main hospital number and ask for the Internal Medicine Resident On-Call. If you need medication refills, please notify your pharmacy one week in advance and they will send [place] a request.   Thank you for letting us take part in your care. Wishing you the best!  Steffanie Rainwater, MD 03/31/2021, 2:13 PM IM Resident, PGY-2 Duwayne Heck 41:10

## 2021-04-18 DIAGNOSIS — G43909 Migraine, unspecified, not intractable, without status migrainosus: Secondary | ICD-10-CM | POA: Insufficient documentation

## 2021-08-27 HISTORY — PX: BREAST BIOPSY: SHX20

## 2021-09-02 ENCOUNTER — Ambulatory Visit
Admission: RE | Admit: 2021-09-02 | Discharge: 2021-09-02 | Disposition: A | Discharge status: Home / Self Care | Payer: BC Managed Care – PPO | Source: Ambulatory Visit | Attending: Internal Medicine | Admitting: Internal Medicine

## 2021-09-02 ENCOUNTER — Other Ambulatory Visit: Payer: Self-pay | Admitting: Internal Medicine

## 2021-09-02 DIAGNOSIS — N6310 Unspecified lump in the right breast, unspecified quadrant: Secondary | ICD-10-CM

## 2021-09-02 DIAGNOSIS — R928 Other abnormal and inconclusive findings on diagnostic imaging of breast: Secondary | ICD-10-CM | POA: Diagnosis not present

## 2021-09-02 DIAGNOSIS — N6313 Unspecified lump in the right breast, lower outer quadrant: Secondary | ICD-10-CM | POA: Diagnosis not present

## 2021-09-02 DIAGNOSIS — N6011 Diffuse cystic mastopathy of right breast: Secondary | ICD-10-CM | POA: Diagnosis not present

## 2021-09-05 ENCOUNTER — Encounter: Payer: Self-pay | Admitting: Internal Medicine

## 2021-09-05 DIAGNOSIS — N63 Unspecified lump in unspecified breast: Secondary | ICD-10-CM | POA: Insufficient documentation

## 2021-09-21 DIAGNOSIS — N6019 Diffuse cystic mastopathy of unspecified breast: Secondary | ICD-10-CM | POA: Insufficient documentation

## 2021-09-21 DIAGNOSIS — I10 Essential (primary) hypertension: Secondary | ICD-10-CM | POA: Diagnosis not present

## 2021-10-28 ENCOUNTER — Encounter: Payer: Self-pay | Admitting: *Deleted

## 2021-10-28 NOTE — Progress Notes (Signed)
[facility] Quality Team Note  Name: Valerie Fletcher Date of Birth: [birth year] MRN: [id number] Date: 10/28/2021  [facility] Quality Team has reviewed this patient's chart, please see recommendations below:  AWV;  Colorectal Screening;  Pt has open gaps for measures listed above.  No upcoming appointments on file.

## 2021-12-01 ENCOUNTER — Other Ambulatory Visit: Payer: Self-pay

## 2021-12-01 DIAGNOSIS — Z1231 Encounter for screening mammogram for malignant neoplasm of breast: Secondary | ICD-10-CM

## 2021-12-08 DIAGNOSIS — I10 Essential (primary) hypertension: Secondary | ICD-10-CM | POA: Diagnosis not present

## 2021-12-29 ENCOUNTER — Ambulatory Visit: Payer: BC Managed Care – PPO

## 2022-02-23 ENCOUNTER — Other Ambulatory Visit: Payer: Self-pay | Admitting: Family Medicine

## 2022-02-23 ENCOUNTER — Other Ambulatory Visit: Payer: Self-pay | Admitting: Physician Assistant

## 2022-02-23 ENCOUNTER — Ambulatory Visit
Admission: RE | Admit: 2022-02-23 | Discharge: 2022-02-23 | Disposition: A | Discharge status: Home / Self Care | Payer: BC Managed Care – PPO | Source: Ambulatory Visit

## 2022-02-23 DIAGNOSIS — Z1231 Encounter for screening mammogram for malignant neoplasm of breast: Secondary | ICD-10-CM

## 2022-02-24 ENCOUNTER — Other Ambulatory Visit: Payer: Self-pay | Admitting: Physician Assistant

## 2022-02-24 DIAGNOSIS — N644 Mastodynia: Secondary | ICD-10-CM

## 2022-03-06 ENCOUNTER — Other Ambulatory Visit: Payer: BC Managed Care – PPO

## 2022-03-10 ENCOUNTER — Ambulatory Visit
Admission: RE | Admit: 2022-03-10 | Discharge: 2022-03-10 | Disposition: A | Discharge status: Home / Self Care | Payer: BC Managed Care – PPO | Source: Ambulatory Visit | Attending: Physician Assistant | Admitting: Physician Assistant

## 2022-03-10 ENCOUNTER — Ambulatory Visit: Payer: BC Managed Care – PPO

## 2022-03-10 DIAGNOSIS — N644 Mastodynia: Secondary | ICD-10-CM

## 2022-05-03 IMAGING — US US BREAST*R* LIMITED INC AXILLA
1 series · 9 of 9 positions shown · non-contrast
Comparison: Previous exam(s).

CLINICAL DATA: Screening recall for a possible right breast mass.

EXAM:
DIGITAL DIAGNOSTIC UNILATERAL RIGHT MAMMOGRAM WITH TOMOSYNTHESIS AND
CAD; ULTRASOUND RIGHT BREAST LIMITED
TECHNIQUE: Right digital diagnostic mammography and breast tomosynthesis was
performed. The images were evaluated with computer-aided detection.;
Targeted ultrasound examination of the right breast was performed

[Series 1: us breast*right* limited inc axilla · 0.06mm/px · 9 of 9 slices shown]
[im 1/9]
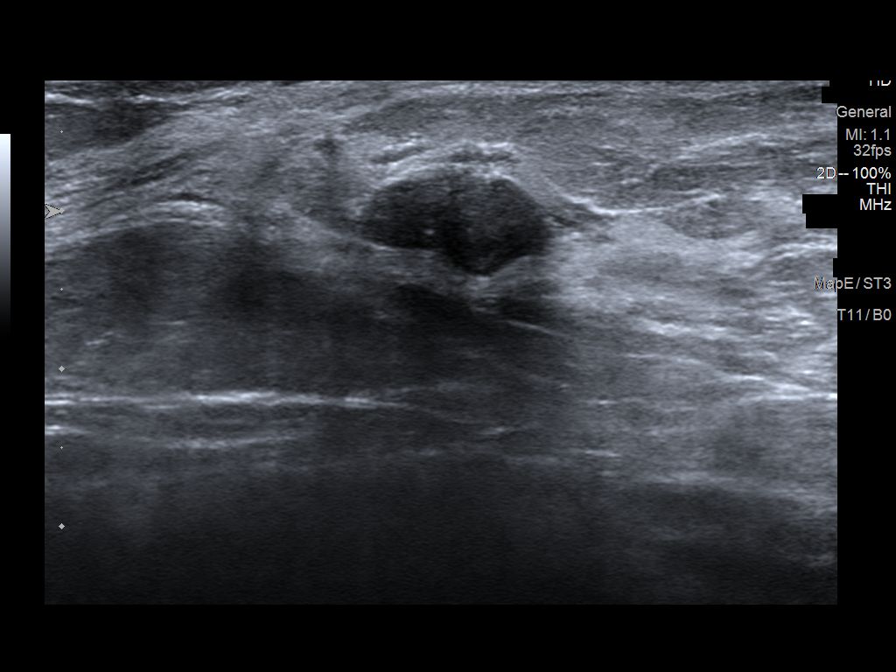
[im 2/9]
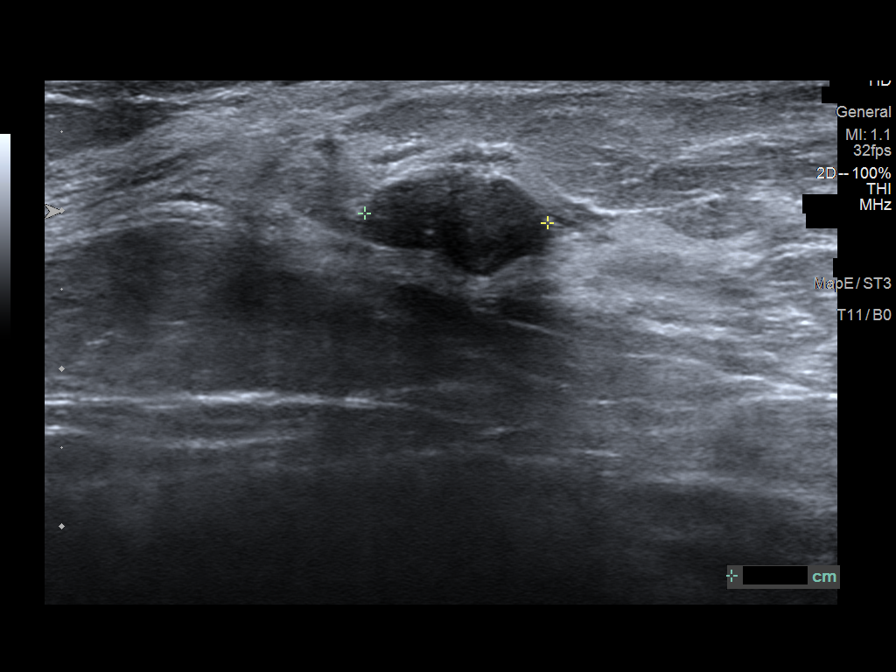
[im 3/9]
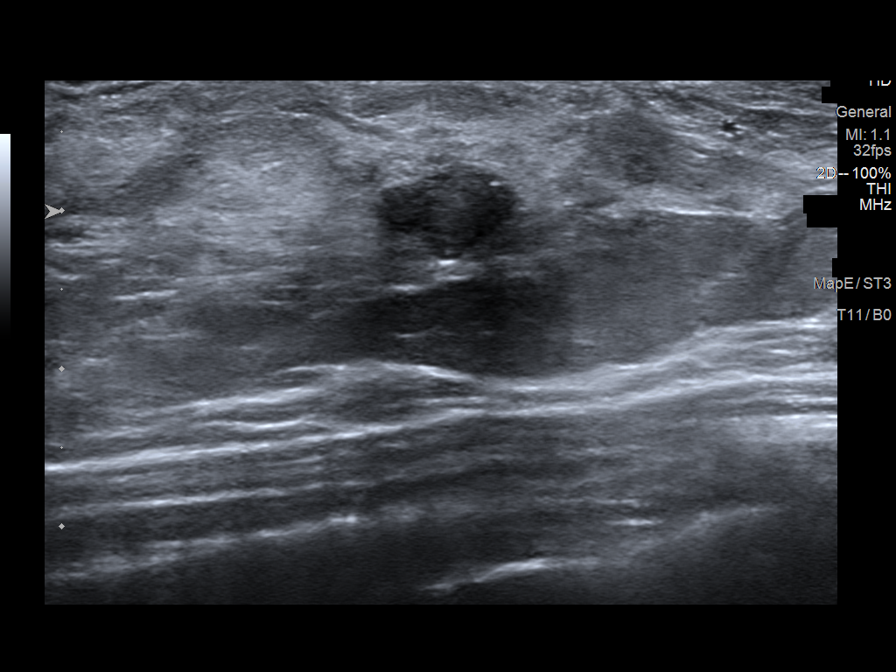
[im 4/9]
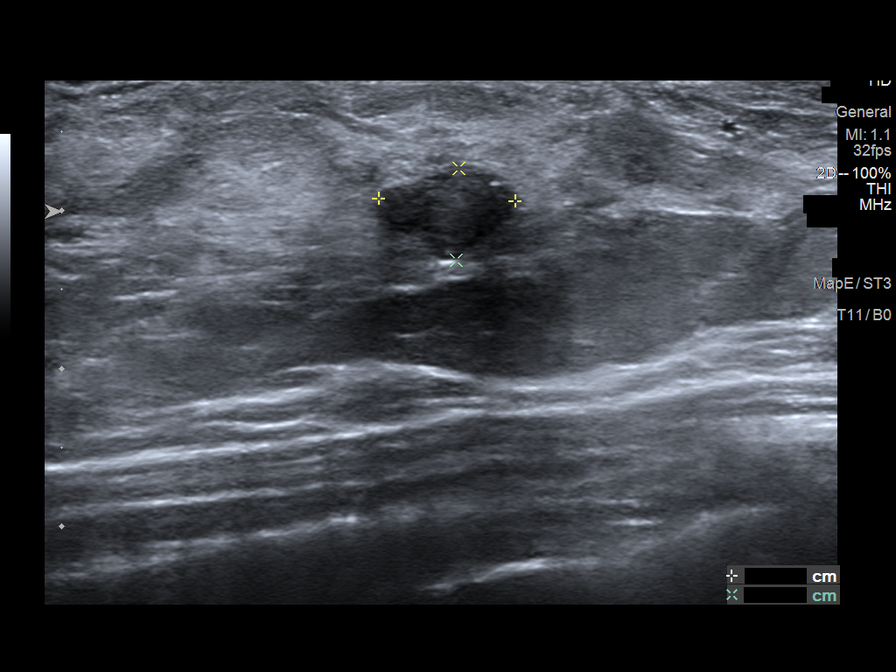
[im 5/9]
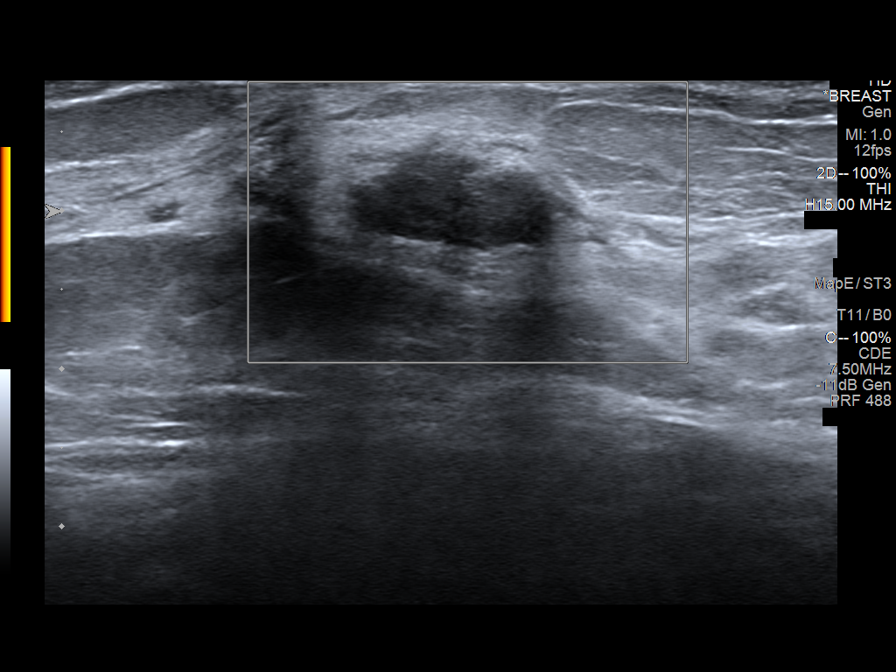
[im 6/9]
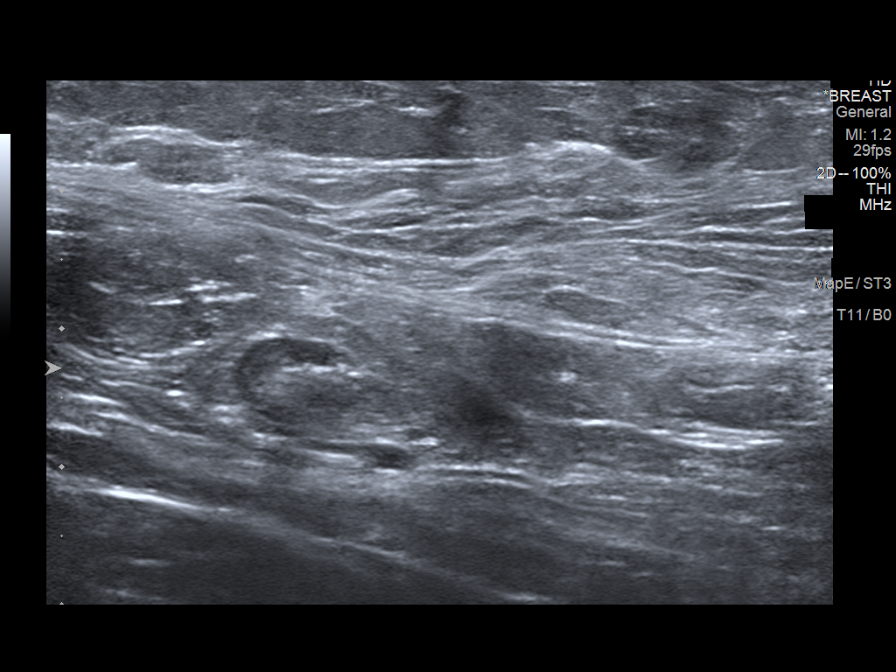
[im 7/9]
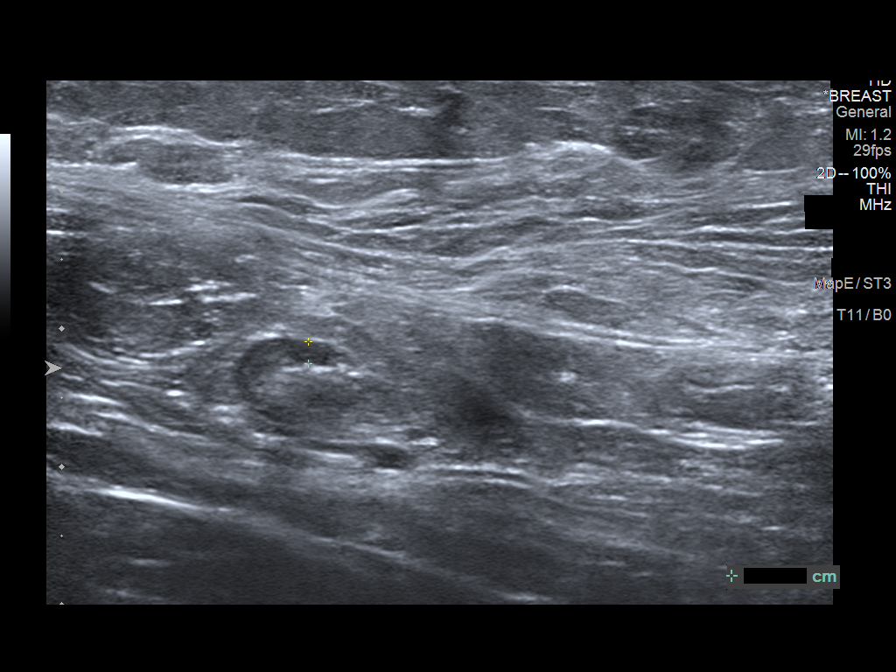
[im 8/9]
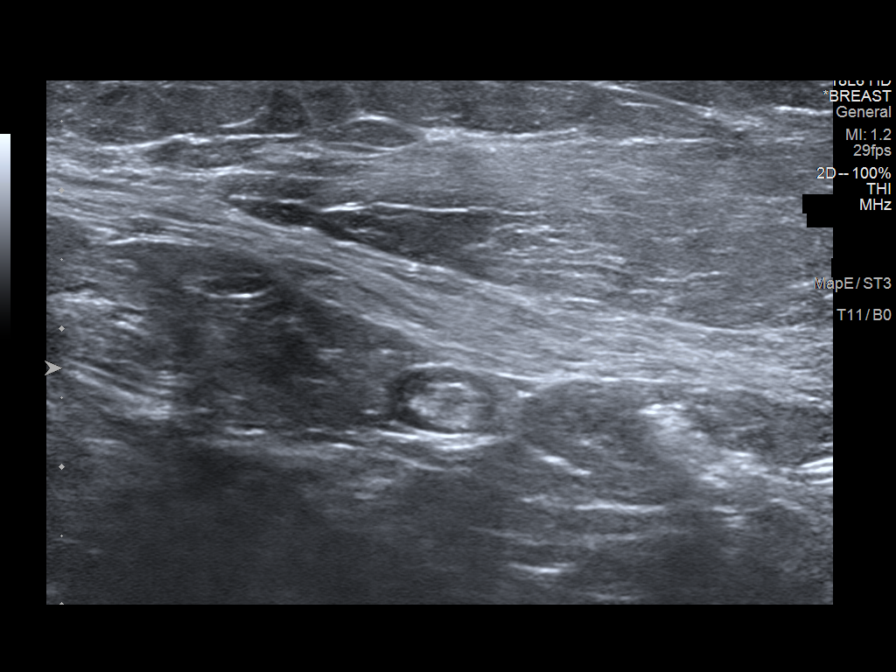
[im 9/9]
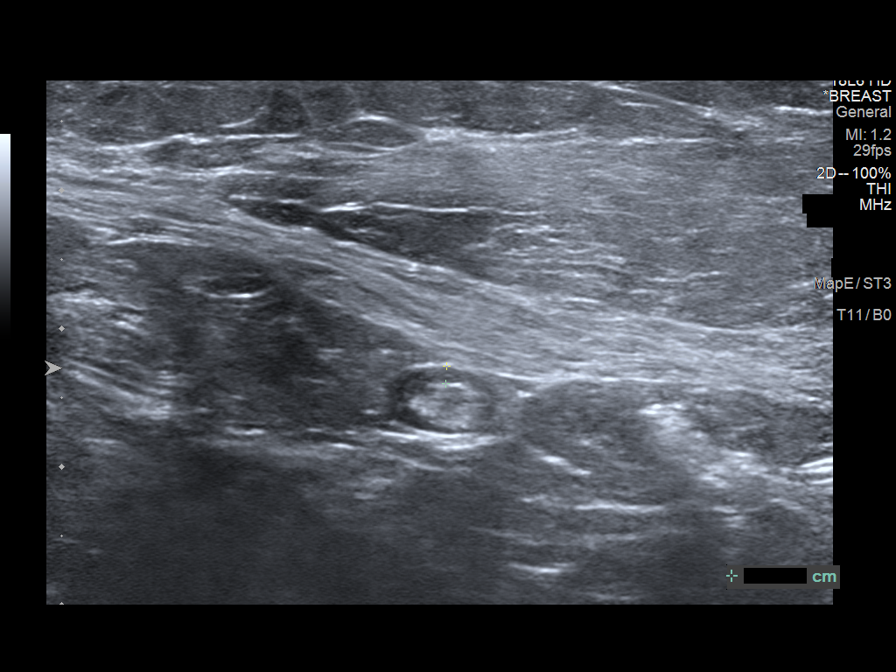

[9 of 9 positions shown; findings below may reference images not displayed]

ACR Breast Density Category b: There are scattered areas of
fibroglandular density.
FINDINGS: Spot compression tomosynthesis images through the retroareolar right
breast demonstrates a 1.2 cm mass with indistinct margins.

Ultrasound targeted to the right breast at 7 o'clock, 1 cm from the
nipple demonstrates a circumscribed oval hypoechoic mass measuring
1.2 x 0.6 x 0.9 cm. No abnormal lymph nodes are found in the right
axilla.
IMPRESSION: The mass in the right breast likely represents a benign
fibroadenoma.

RECOMMENDATION:
Six-month follow-up right breast ultrasound.

I have discussed the findings and recommendations with the patient.
If applicable, a reminder letter will be sent to the patient
regarding the next appointment.

BI-RADS CATEGORY  3: Probably benign.

## 2022-05-03 IMAGING — MG MM DIGITAL DIAGNOSTIC UNILAT*R* W/ TOMO W/ CAD
6 series · 6 of 18 positions shown · non-contrast
Comparison: Previous exam(s).

CLINICAL DATA: Screening recall for a possible right breast mass.

EXAM:
DIGITAL DIAGNOSTIC UNILATERAL RIGHT MAMMOGRAM WITH TOMOSYNTHESIS AND
CAD; ULTRASOUND RIGHT BREAST LIMITED
TECHNIQUE: Right digital diagnostic mammography and breast tomosynthesis was
performed. The images were evaluated with computer-aided detection.;
Targeted ultrasound examination of the right breast was performed

[R CC synth-2D (1 of 2)]
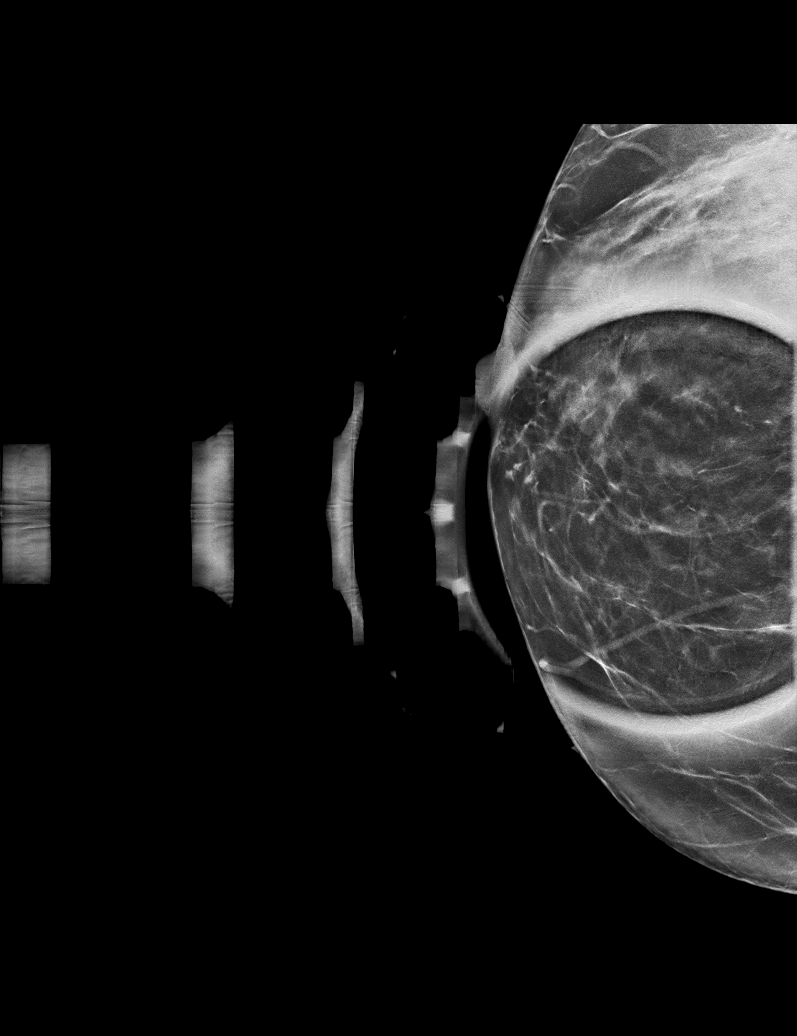

[R CC synth-2D (2 of 2)]
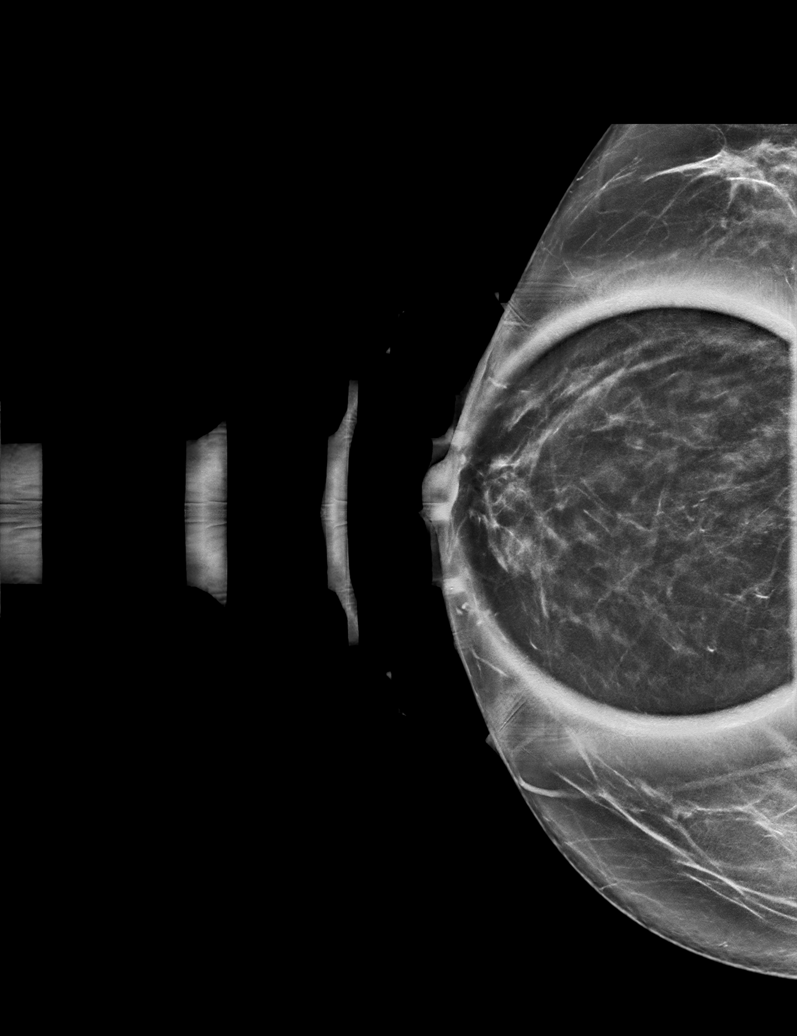

[R MLO synth-2D]
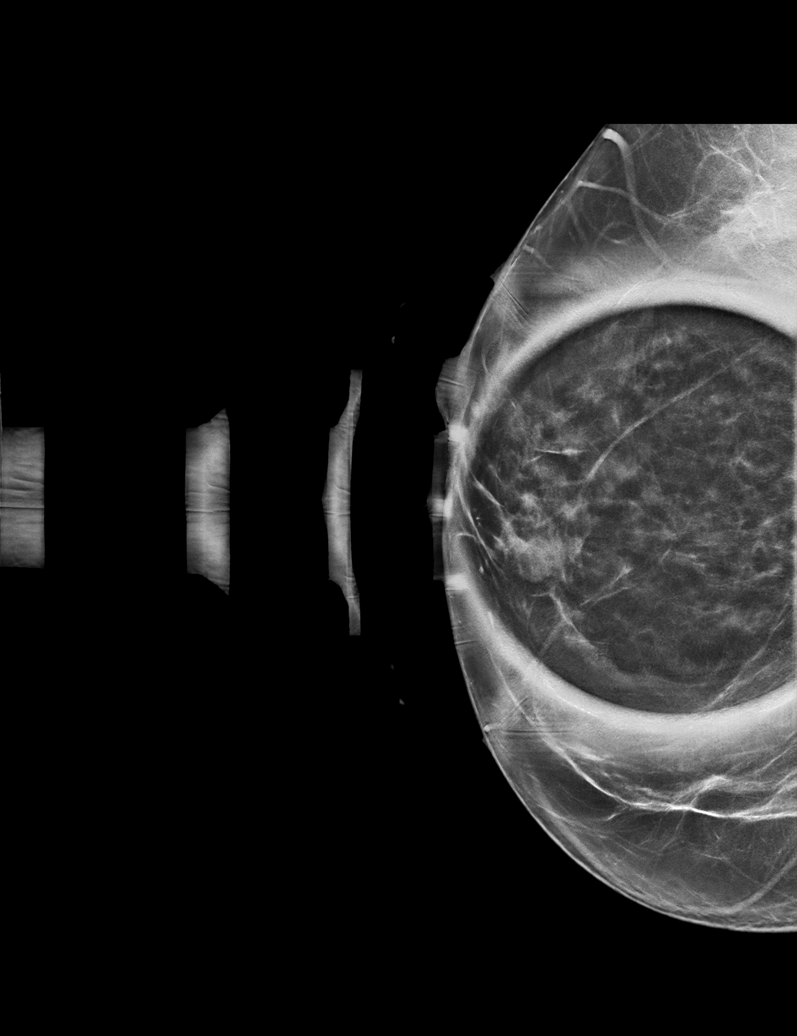

[R CC tomo (1 of 2) · tomo slice 29/57.0]
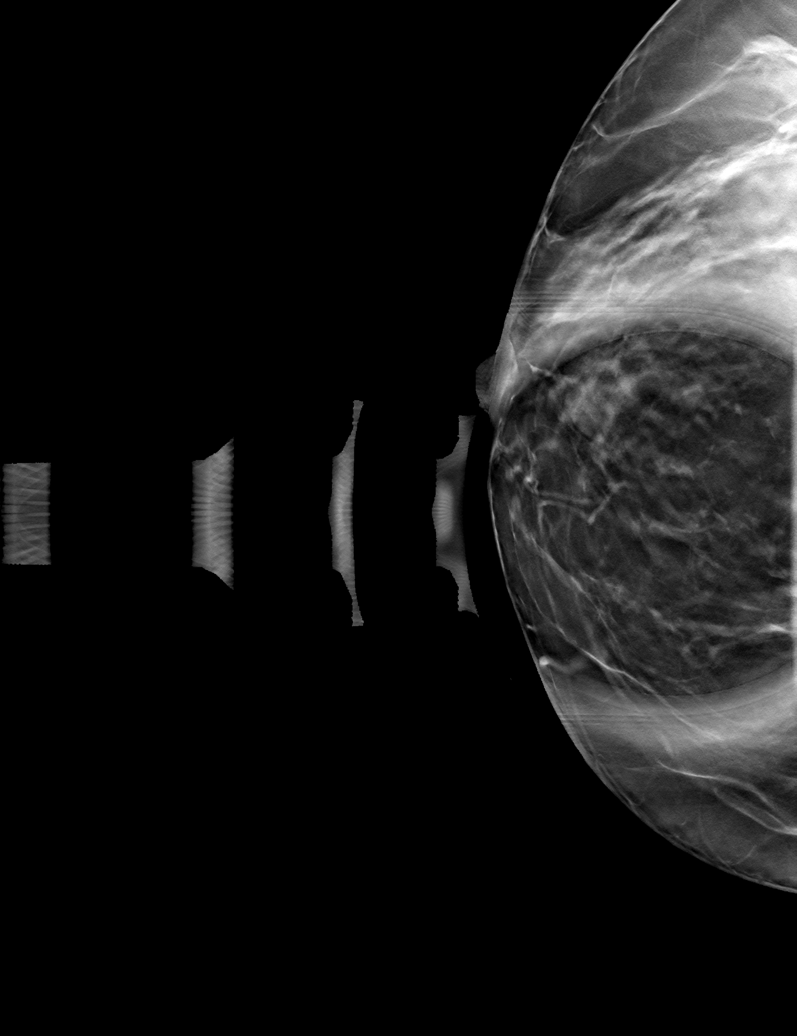

[R MLO tomo · tomo slice 32/63.0]
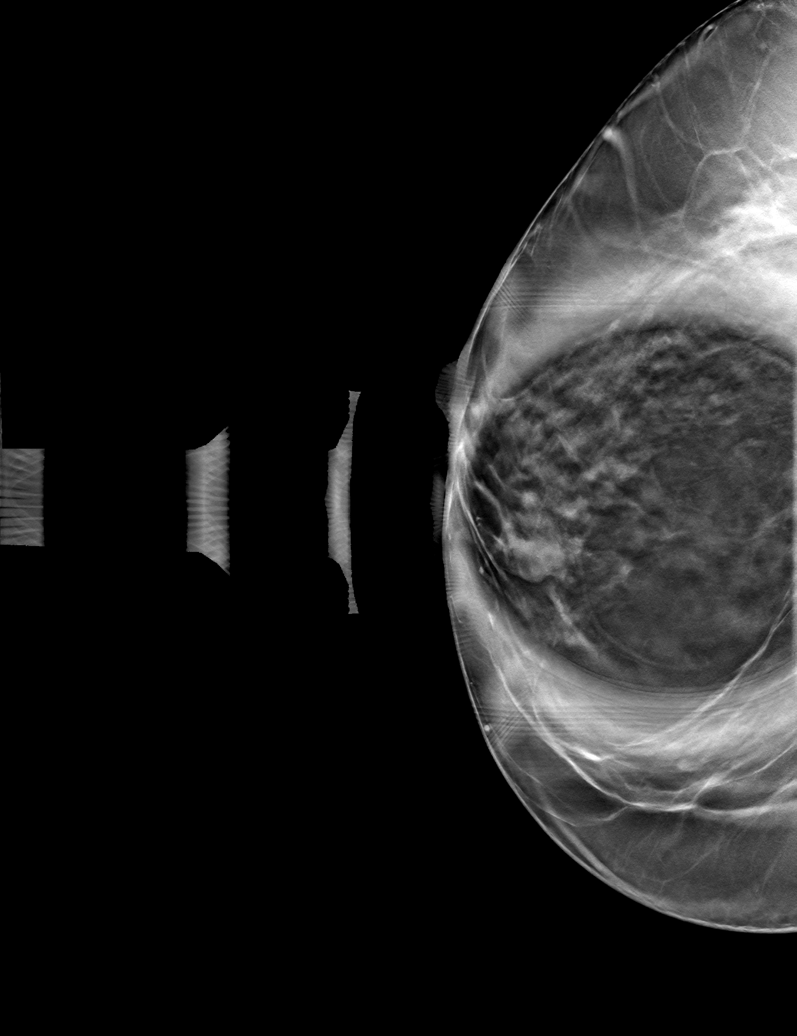

[R CC tomo (2 of 2) · tomo slice 31/62.0]
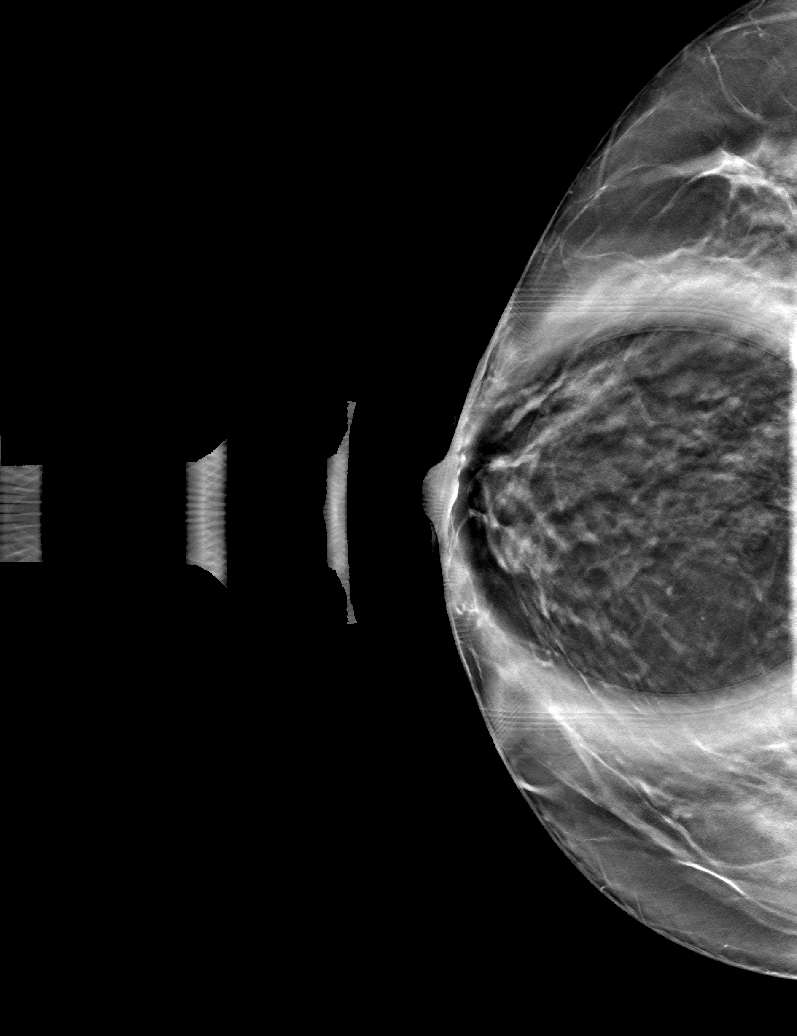

[6 of 18 positions shown; findings below may reference images not displayed]

ACR Breast Density Category b: There are scattered areas of
fibroglandular density.
FINDINGS: Spot compression tomosynthesis images through the retroareolar right
breast demonstrates a 1.2 cm mass with indistinct margins.

Ultrasound targeted to the right breast at 7 o'clock, 1 cm from the
nipple demonstrates a circumscribed oval hypoechoic mass measuring
1.2 x 0.6 x 0.9 cm. No abnormal lymph nodes are found in the right
axilla.
IMPRESSION: The mass in the right breast likely represents a benign
fibroadenoma.

RECOMMENDATION:
Six-month follow-up right breast ultrasound.

I have discussed the findings and recommendations with the patient.
If applicable, a reminder letter will be sent to the patient
regarding the next appointment.

BI-RADS CATEGORY  3: Probably benign.

## 2022-06-06 DIAGNOSIS — Z0001 Encounter for general adult medical examination with abnormal findings: Secondary | ICD-10-CM | POA: Diagnosis not present

## 2022-06-06 DIAGNOSIS — R748 Abnormal levels of other serum enzymes: Secondary | ICD-10-CM | POA: Diagnosis not present

## 2022-06-09 ENCOUNTER — Other Ambulatory Visit: Payer: Self-pay | Admitting: Internal Medicine

## 2022-06-09 ENCOUNTER — Ambulatory Visit
Admission: RE | Admit: 2022-06-09 | Discharge: 2022-06-09 | Disposition: A | Discharge status: Home / Self Care | Payer: BC Managed Care – PPO | Source: Ambulatory Visit | Attending: Internal Medicine | Admitting: Internal Medicine

## 2022-06-09 DIAGNOSIS — M545 Low back pain, unspecified: Secondary | ICD-10-CM | POA: Diagnosis not present

## 2022-06-09 DIAGNOSIS — M25512 Pain in left shoulder: Secondary | ICD-10-CM | POA: Diagnosis not present

## 2022-06-09 DIAGNOSIS — M25312 Other instability, left shoulder: Secondary | ICD-10-CM

## 2022-06-09 DIAGNOSIS — M4126 Other idiopathic scoliosis, lumbar region: Secondary | ICD-10-CM

## 2022-06-25 LAB — COLOGUARD: COLOGUARD: POSITIVE — AB

## 2022-06-25 LAB — EXTERNAL GENERIC LAB PROCEDURE: COLOGUARD: POSITIVE — AB

## 2022-06-26 ENCOUNTER — Ambulatory Visit: Payer: BC Managed Care – PPO | Attending: Internal Medicine | Admitting: Physical Therapy

## 2022-06-26 ENCOUNTER — Other Ambulatory Visit: Payer: Self-pay

## 2022-06-26 ENCOUNTER — Encounter: Payer: Self-pay | Admitting: Physical Therapy

## 2022-06-26 DIAGNOSIS — M25512 Pain in left shoulder: Secondary | ICD-10-CM | POA: Insufficient documentation

## 2022-06-26 DIAGNOSIS — M6281 Muscle weakness (generalized): Secondary | ICD-10-CM | POA: Diagnosis not present

## 2022-06-26 DIAGNOSIS — M5459 Other low back pain: Secondary | ICD-10-CM | POA: Diagnosis not present

## 2022-06-26 DIAGNOSIS — G8929 Other chronic pain: Secondary | ICD-10-CM
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
## 2022-07-11 ENCOUNTER — Ambulatory Visit: Payer: BC Managed Care – PPO | Attending: Internal Medicine | Admitting: Physical Therapy

## 2022-07-11 ENCOUNTER — Encounter: Payer: Self-pay | Admitting: Physical Therapy

## 2022-07-11 DIAGNOSIS — M25512 Pain in left shoulder: Secondary | ICD-10-CM | POA: Insufficient documentation

## 2022-07-11 DIAGNOSIS — M6281 Muscle weakness (generalized): Secondary | ICD-10-CM | POA: Diagnosis not present

## 2022-07-11 DIAGNOSIS — M5459 Other low back pain: Secondary | ICD-10-CM | POA: Diagnosis not present

## 2022-07-11 DIAGNOSIS — G8929 Other chronic pain: Secondary | ICD-10-CM | POA: Insufficient documentation

## 2022-07-11 NOTE — Therapy (Signed)
OUTPATIENT PHYSICAL THERAPY TREATMENT   Patient Name: Valerie Fletcher MRN: [id number] DOB:12-16-1970, 52 y.o., female Today's Date: 07/11/2022  END OF SESSION:  PT End of Session - 07/11/22 1419     Visit Number 2    Number of Visits 9    Date for PT Re-Evaluation 08/21/22    Authorization Type [employer] + [employer]    PT Start Time 1418    PT Stop Time 1501    PT Time Calculation (min) 43 min              Past Medical History:  Diagnosis Date   Asthma    Constipation    Fatigue 02/14/2017   GERD (gastroesophageal reflux disease)    Hypertension    Iron deficiency anemia 04/08/2018   Vaginal candidiasis 10/05/2017   Vertigo 11/20/2018   Viral upper respiratory tract infection 05/01/2018   Past Surgical History:  Procedure Laterality Date   DILATION AND CURETTAGE OF UTERUS     HERNIA REPAIR     Umbilical hernia x2, Hiatial hernia   TUBAL LIGATION     UPPER GASTROINTESTINAL ENDOSCOPY     Patient Active Problem List   Diagnosis Date Noted   Fibrocystic disease of breast 09/21/2021   Breast mass 09/05/2021   Migraines 04/18/2021   Positive COVID-19 test 03/31/2021   Muscle spasm 11/25/2020   Preventive measure 11/25/2020   Prediabetes 04/08/2018   Hyperlipidemia 06/30/2017   Rapid palpitations 06/29/2017   Hypertension 02/14/2017   GERD (gastroesophageal reflux disease) 02/14/2017   Asthma 02/14/2017    PCP: ones, Wayland Salinas, PA-C   REFERRING PROVIDER:  Nadyne Coombes, MD   REFERRING DIAG: Other instability, left shoulder [M25.312]  Lumbar spine pain  THERAPY DIAG:  Other low back pain  Muscle weakness (generalized)  Chronic left shoulder pain  Rationale for Evaluation and Treatment: Rehabilitation  ONSET DATE: back the last few months  SUBJECTIVE:                                                                                                                                                                                      SUBJECTIVE  STATEMENT: "I think I may have overexerted myself, my legs were so sore. I think I was doing it wrong."  PERTINENT HISTORY: Hx of pre-diabetes,   PAIN:  shoulder Are you having pain? Yes: NPRS scale: 0/10 currently, at worst 7/10 Pain location: unsure Pain description: uncomfortable.  Aggravating factors: reach acrossing, Laying on the L shoulder Relieving factors: stop using the shoulder,      back Are you having pain? Yes: NPRS scale: 2/10 currently, at worst 10/10 Pain location: Middle low  back  Pain description: Dull, stiff Aggravating factors: Prolonged sitting, Lifting Relieving factors: take some ibuprofen, heating pack   PRECAUTIONS: None  WEIGHT BEARING RESTRICTIONS: No  FALLS:  Has patient fallen in last 6 months? No  LIVING ENVIRONMENT: Lives with: lives with their family Lives in: House/apartment Stairs: No Has following equipment at home: None  OCCUPATION: Release of information specialistist.   PLOF: Independent  PATIENT GOALS: How to handle this at home and decease the pain    OBJECTIVE:   DIAGNOSTIC FINDINGS:  X-ray L shoulder 4/12 FINDINGS: There is no evidence of fracture or dislocation. There is no evidence of arthropathy or other focal bone abnormality. Soft tissues are unremarkable.   IMPRESSION: Negative.   PATIENT SURVEYS :  FOTO Back 53% and predicted 63%  Shoulder 59% and predicted 64% COGNITION: Overall cognitive status: Within functional limits for tasks assessed     SENSATION: Light touch: WFL and Except for LUE at C3 ( diminished sensation)  POSTURE: Forward head posture, anteriorly rotated shoulders.   UPPER EXTREMITY ROM:   Active ROM Right eval Left eval  Shoulder flexion [facility]  Shoulder extension    Shoulder abduction WFL 72 P!  Shoulder adduction    Shoulder internal rotation WFL in neutral) WFL (in netural)  Shoulder external rotation    Elbow flexion    Elbow extension    Wrist flexion    Wrist  extension    Wrist ulnar deviation    Wrist radial deviation    Wrist pronation    Wrist supination    (Blank rows = not tested)  UPPER EXTREMITY MMT:  MMT Right eval Left eval  Shoulder flexion 4 4-  Shoulder extension 4 4-  Shoulder abduction 4 3  Shoulder adduction    Shoulder internal rotation 4 3+  Shoulder external rotation 4 3+  Middle trapezius    Lower trapezius    Elbow flexion    Elbow extension    Wrist flexion    Wrist extension    Wrist ulnar deviation    Wrist radial deviation    Wrist pronation    Wrist supination    Grip strength (lbs)    (Blank rows = not tested)   LUMBAR ROM:   Active  A/PROM  06/26/2022  Flexion 62  Extension 18  Right lateral flexion 12  Left lateral flexion 12  Right rotation   Left rotation    (Blank rows = not tested)  LE ROM:  Active  Right 06/26/2022 Left 06/26/2022  Hip flexion    Hip extension    Hip abduction    Hip adduction    Hip internal rotation    Hip external rotation    Knee flexion    Knee extension    Ankle dorsiflexion    Ankle plantarflexion    Ankle inversion    Ankle eversion     (Blank rows = not tested)  LE MMT:  MMT Right 06/26/2022 Left 06/26/2022  Hip flexion 4 4-  Hip extension 4 4  Hip abduction 4+ 4+  Hip adduction 4+ 4+  Hip internal rotation    Hip external rotation    Knee flexion    Knee extension    Ankle dorsiflexion    Ankle plantarflexion    Ankle inversion    Ankle eversion     (Blank rows = not tested)   GAIT: Distance walked: 171ft (to tx area) Assistive device utilized: None Level of assistance: Complete Independence Comments: unremarkable.  SHOULDER SPECIAL TESTS: Withheld testing at this time due     PALPATION:  TTP along the R lumbar paraspinals at L1-L5, Tenderness noted at the L1-L2 segment  TTP for L shoulder along the infraspinatus and teres minor, and upper trap with mulitple trigger points noted   TODAY'S TREATMENT:                                                                                                                                          [facility] Adult PT Treatment:                                                DATE: 07/11/2022 Therapeutic Exercise: Seated hamstring stretch 2 x 30 sec  SLR 2 x 10 with 3 count up/ down Supine marching alternating L/R while maintaining posterior pelvic tilt 2 x 20 (tactile cues for proper form) LTR 1 x 15 Seated shoulder ER 2 x 10 (modified band to YTB) Seated shoulder IR 2 x 10 with RTB Manual Therapy: RLE LAD grade V    [facility] Adult PT Treatment:                                                DATE: 06/26/2022 Therapeutic Exercise: Shoulder IR/ER 1 x 10 with RTB Hamstring stretch PNF contract/ relax with 10 sec contraction x 30 seconds stretch RLE SLR 1 x 10  PATIENT EDUCATION: Education details: evaluation findings, POC, goals, HEP with proper form Person educated: Patient Education method: Explanation, Verbal cues, and Handouts Education comprehension: verbalized understanding  HOME EXERCISE PROGRAM: Access Code: [email or web address] URL: [email or web address] Date: 06/26/2022 Prepared by: Lulu Riding  Exercises - Standing Shoulder Row with Anchored Resistance  - 1 x daily - 7 x weekly - 2 sets - 10 reps - Shoulder Internal Rotation  - 1 x daily - 7 x weekly - 2 sets - 10 reps - Shoulder External Rotation  - 1 x daily - 7 x weekly - 2 sets - 10 reps - Lower Trunk Rotation  - 1 x daily - 7 x weekly - 2 sets - 10 reps - Seated Flexion Stretch  - 3 x daily - 7 x weekly - 1-2 sets - 2-3 reps - 30-60 hold - Seated Flexion Stretch with Swiss Ball  - 3 x daily - 7 x weekly - 1-2 sets - 2-3 reps - 30-60 sec hold - Seated Hamstring Stretch  - 1 x daily - 7 x weekly - 2 sets - 2 reps - 30 hold - SLR  - 1 x daily - 7 x weekly - 2 sets - 10-15 reps -  1 hold  ASSESSMENT:  CLINICAL IMPRESSION: Pt arrives to session reporting reduced pain in the back but soreness in the  legs as a result HEP. Time was taken to review HEP and mod cues needed for proper form. She fatigues quickly with RLE and LUE strengthening, modified LUE IR/ER resistance to and discussed importance of controlled activation to maximize her stability. End of session she reported decreased pain.    Evaluation: Patient is a 52 y.o. F who was seen today for physical therapy evaluation and treatment for dx of L shoulder instability and low back pain. She has functional shoulder ROM with the exception of abduction secondary to pain, LUE presents with gross weakness compared bil. Low back she demonstrates TTP along the L1-L2 spinous process and R SIJ. Performed a test / treat/ re-test and she responded favorably with hamstring stretching and R hip flexor MET via SLR. She noed improvement of pain and ease of trunk mobility after suggesting potentiall SIJ involvement. She would benefit from physical therapy to promote L shoulder strengthening / stability, improve pain free ROM, increase trunk mobility, reduce pain and improve postural awareness while maximizing her function by addressing the deficits listed.    OBJECTIVE IMPAIRMENTS: decreased activity tolerance, decreased endurance, decreased ROM, decreased strength, increased muscle spasms, improper body mechanics, postural dysfunction, and pain.   ACTIVITY LIMITATIONS: carrying, lifting, bending, sitting, standing, squatting, and reach over head  PARTICIPATION LIMITATIONS: cleaning, laundry, shopping, community activity, and occupation  PERSONAL FACTORS: Age, Past/current experiences, Time since onset of injury/illness/exacerbation, and 1-2 comorbidities: hx of dislocations and pre-diabetes  are also affecting patient's functional outcome.   REHAB POTENTIAL: Good  CLINICAL DECISION MAKING: Evolving/moderate complexity  EVALUATION COMPLEXITY: Moderate  GOALS: Goals reviewed with patient? Yes  SHORT TERM GOALS: Target date: 07/24/2022   PT to be  IND with initial HEP for therapeutic progression Baseline: No previous HEP Goal status: INITIAL  2.  Pt to verbalize/ demo efficient posture and lifting mechanics to prevent and reduce L shoulder and low back pain Baseline: no knowledge of posture Goal status: INITIAL  3.  Pt to report low back pain reducing to </= 5/10 at worst to demo improving function Baseline: worst 10/10 pain Goal status: INITIAL  4.  Increase L shoulder abduction by >/= 10 degrees to demo improving shoulder stability with </= 5/10 pain  Baseline: see flow sheet Goal status: INITIAL   LONG TERM GOALS: Target date: 08/21/2022   Increase L shoulder abduction and all other movements to [facility] compared bil with </= 2/10 pain and no report of feeling unstable Baseline: see goals Goal status: INITIAL  2.  Pt to be able to to sit, stand for >/= 60 min with </=2/10  report of back pain to assist with endurance required for work  Baseline: reports pain with work  Goal status: INITIAL  3.  Increase LUE gross strength to >/= 4/5 to promote stability and assist with functional strength required for ADLs Baseline:  Goal status: INITIAL  4.  Increase FOTO score for both back and shoulder to >/= 63% to demo improvement in function Baseline:  Goal status: INITIAL  5.  Pt to report no back spasm for >/= 3 weeks to demonstrate improvement in posture and QOL.  Baseline:  Goal status: INITIAL  6.  Pt to be IND with all HEP and will be able to maintain and progress their current LOF IND. Baseline:  Goal status: INITIAL  PLAN: PT FREQUENCY: 1-2x/week  PT DURATION: 8 weeks  PLANNED INTERVENTIONS: Therapeutic exercises, Therapeutic activity, Neuromuscular re-education, Balance training, Gait training, Patient/Family education, Self Care, Joint mobilization, Dry Needling, Cryotherapy, Moist heat, Taping, Manual therapy, and Re-evaluation  PLAN FOR NEXT SESSION: Review/ update HEP PRN. Posture education with focus on  sitting for long periods of time. STW along lumbar paraspainsl, potential for SIJ involvement on the R. L scapular strengthening.   Jerlene Rockers PT, DPT, LAT, ATC  07/11/22  3:05 PM

## 2022-07-12 ENCOUNTER — Ambulatory Visit (AMBULATORY_SURGERY_CENTER): Payer: BC Managed Care – PPO

## 2022-07-12 VITALS — Ht 64.0 in | Wt 173.0 lb

## 2022-07-12 DIAGNOSIS — R195 Other fecal abnormalities: Secondary | ICD-10-CM

## 2022-07-12 MED ORDER — ONDANSETRON HCL 4 MG PO TABS: 4.0000 mg | ORAL_TABLET | Freq: Three times a day (TID) | ORAL | 0 refills | Status: DC | PRN

## 2022-07-12 MED ORDER — NA SULFATE-K SULFATE-MG SULF 17.5-3.13-1.6 GM/177ML PO SOLN: 1.0000 | Freq: Once | ORAL | 0 refills | Status: AC

## 2022-07-12 NOTE — Progress Notes (Signed)
Pre visit completed via phone call; Patient verified name, DOB, and address; Patient reports weight =173 lbs No egg or soy allergy known to patient;  No issues known to pt with past sedation with any surgeries or procedures; Patient denies ever being told they had issues or difficulty with intubation;  No FH of Malignant Hyperthermia; Pt is not on diet pills; Pt is not on home 02;  Pt is not on blood thinners;  Pt reports issues with constipation =patient reports she will take a stool softener, increase oral fluids, or take Mag citrate or fleets enema;  No A fib or A flutter; Have any cardiac testing pending--NO Pt instructed to use [email or web address] or GoodRx for a price reduction on prep   Insurance verified during PV appt=BCBS   Patient's chart reviewed by Valerie Fletcher CNRA prior to previsit and patient appropriate for the [facility].  Previsit completed and red dot placed by patient's name on their procedure day (on provider's schedule);   Instructions sent via MyChart per patient request; Zofran RX sent due to patient's hx with prep (not going so well); Good Rx information sent in RX;

## 2022-07-18 ENCOUNTER — Ambulatory Visit: Payer: BC Managed Care – PPO | Admitting: Physical Therapy

## 2022-07-18 DIAGNOSIS — M5459 Other low back pain: Secondary | ICD-10-CM

## 2022-07-18 DIAGNOSIS — M25512 Pain in left shoulder: Secondary | ICD-10-CM | POA: Diagnosis not present

## 2022-07-18 DIAGNOSIS — G8929 Other chronic pain: Secondary | ICD-10-CM | POA: Diagnosis not present

## 2022-07-18 DIAGNOSIS — M6281 Muscle weakness (generalized): Secondary | ICD-10-CM

## 2022-07-18 NOTE — Therapy (Signed)
OUTPATIENT PHYSICAL THERAPY TREATMENT   Patient Name: Valerie Fletcher MRN: [id number] DOB:1970-09-22, 52 y.o., female Today's Date: 07/18/2022  END OF SESSION:  PT End of Session - 07/18/22 1508     Visit Number 3    Number of Visits 9    Date for PT Re-Evaluation 08/21/22    Authorization Type [employer] + [employer]    PT Start Time 0245    PT Stop Time 0325    PT Time Calculation (min) 40 min               Past Medical History:  Diagnosis Date   Asthma    uses inhaler   Constipation    Fatigue 02/14/2017   GERD (gastroesophageal reflux disease)    takes OTC meds as well as daily;   Hyperlipidemia    on meds   Hypertension    on meds   Iron deficiency anemia 04/08/2018   hx of   Seasonal allergies    Vaginal candidiasis 10/05/2017   Vertigo 11/20/2018   Viral upper respiratory tract infection 05/01/2018   Past Surgical History:  Procedure Laterality Date   DILATION AND CURETTAGE OF UTERUS     HERNIA REPAIR     Umbilical hernia x2, Hiatial hernia   TUBAL LIGATION     UPPER GASTROINTESTINAL ENDOSCOPY     Patient Active Problem List   Diagnosis Date Noted   Fibrocystic disease of breast 09/21/2021   Breast mass 09/05/2021   Migraines 04/18/2021   Positive COVID-19 test 03/31/2021   Muscle spasm 11/25/2020   Preventive measure 11/25/2020   Prediabetes 04/08/2018   Hyperlipidemia 06/30/2017   Rapid palpitations 06/29/2017   Hypertension 02/14/2017   GERD (gastroesophageal reflux disease) 02/14/2017   Asthma 02/14/2017    PCP: ones, Wayland Salinas, PA-C   REFERRING PROVIDER:  Nadyne Coombes, MD   REFERRING DIAG: Other instability, left shoulder [M25.312]  Lumbar spine pain  THERAPY DIAG:  Other low back pain  Chronic left shoulder pain  Muscle weakness (generalized)  Rationale for Evaluation and Treatment: Rehabilitation  ONSET DATE: back the last few months  SUBJECTIVE:                                                                                                                                                                                       SUBJECTIVE STATEMENT: "The arm hurts like crap but the back is getting better. I am not taking the flexeril that often. "   PERTINENT HISTORY: Hx of pre-diabetes,   PAIN:  shoulder Are you having pain? Yes: NPRS scale: 0/10 currently, at worst 7/10 Pain location: unsure Pain description: uncomfortable.  Aggravating factors: reach acrossing, Laying on the L shoulder Relieving factors: stop using the shoulder,      back Are you having pain? Yes: NPRS scale: 2/10 currently Pain location: Middle low back  Pain description: Dull, stiff Aggravating factors: Prolonged sitting, Lifting Relieving factors: take some ibuprofen, heating pack   PRECAUTIONS: None  WEIGHT BEARING RESTRICTIONS: No  FALLS:  Has patient fallen in last 6 months? No  LIVING ENVIRONMENT: Lives with: lives with their family Lives in: House/apartment Stairs: No Has following equipment at home: None  OCCUPATION: Release of information specialistist.   PLOF: Independent  PATIENT GOALS: How to handle this at home and decease the pain    OBJECTIVE:   DIAGNOSTIC FINDINGS:  X-ray L shoulder 4/12 FINDINGS: There is no evidence of fracture or dislocation. There is no evidence of arthropathy or other focal bone abnormality. Soft tissues are unremarkable.   IMPRESSION: Negative.   PATIENT SURVEYS :  FOTO Back 53% and predicted 63%  Shoulder 59% and predicted 64% COGNITION: Overall cognitive status: Within functional limits for tasks assessed     SENSATION: Light touch: WFL and Except for LUE at C3 ( diminished sensation)  POSTURE: Forward head posture, anteriorly rotated shoulders.   UPPER EXTREMITY ROM:   Active ROM Right eval Left eval  Shoulder flexion [facility]  Shoulder extension    Shoulder abduction WFL 72 P!  Shoulder adduction    Shoulder internal rotation WFL in  neutral) WFL (in netural)  Shoulder external rotation    Elbow flexion    Elbow extension    Wrist flexion    Wrist extension    Wrist ulnar deviation    Wrist radial deviation    Wrist pronation    Wrist supination    (Blank rows = not tested)  UPPER EXTREMITY MMT:  MMT Right eval Left eval  Shoulder flexion 4 4-  Shoulder extension 4 4-  Shoulder abduction 4 3  Shoulder adduction    Shoulder internal rotation 4 3+  Shoulder external rotation 4 3+  Middle trapezius    Lower trapezius    Elbow flexion    Elbow extension    Wrist flexion    Wrist extension    Wrist ulnar deviation    Wrist radial deviation    Wrist pronation    Wrist supination    Grip strength (lbs)    (Blank rows = not tested)   LUMBAR ROM:   Active  A/PROM  06/26/2022  Flexion 62  Extension 18  Right lateral flexion 12  Left lateral flexion 12  Right rotation   Left rotation    (Blank rows = not tested)  LE ROM:  Active  Right 06/26/2022 Left 06/26/2022  Hip flexion    Hip extension    Hip abduction    Hip adduction    Hip internal rotation    Hip external rotation    Knee flexion    Knee extension    Ankle dorsiflexion    Ankle plantarflexion    Ankle inversion    Ankle eversion     (Blank rows = not tested)  LE MMT:  MMT Right 06/26/2022 Left 06/26/2022  Hip flexion 4 4-  Hip extension 4 4  Hip abduction 4+ 4+  Hip adduction 4+ 4+  Hip internal rotation    Hip external rotation    Knee flexion    Knee extension    Ankle dorsiflexion    Ankle plantarflexion    Ankle inversion  Ankle eversion     (Blank rows = not tested)   GAIT: Distance walked: 153ft (to tx area) Assistive device utilized: None Level of assistance: Complete Independence Comments: unremarkable.  SHOULDER SPECIAL TESTS: Withheld testing at this time due     PALPATION:  TTP along the R lumbar paraspinals at L1-L5, Tenderness noted at the L1-L2 segment  TTP for L shoulder along the  infraspinatus and teres minor, and upper trap with mulitple trigger points noted   TODAY'S TREATMENT:                                                                                                                                         [facility] Adult PT Treatment:                                                DATE: 07/18/22 Therapeutic Exercise: Side lying shoulder flexion to 90 x 10 Side lying shoulder horizontal abduction (from 90 flexion) x 10 Side lying shoulder abduction to 90 x 10 Side lying ER AROM x 10, 1# x 12, 2# x 10 Prone Row x 10  Prone ext with retraction x 10 to neutral  Prone horizontal abduction x 8 thumb up, x 8 thumb out  Seated lumbar flexion stretch with ball 30 sec x 2  Supine red band shoulder horiz ER 2 x 10  SLR with ab brace x 15 each  Supine Marching with cues for ab bracing  PPT to Bridge x 15 LTR     [facility] Adult PT Treatment:                                                DATE: 07/11/2022 Therapeutic Exercise: Seated hamstring stretch 2 x 30 sec  SLR 2 x 10 with 3 count up/ down Supine marching alternating L/R while maintaining posterior pelvic tilt 2 x 20 (tactile cues for proper form) LTR 1 x 15 Seated shoulder ER 2 x 10 (modified band to YTB) Seated shoulder IR 2 x 10 with RTB Manual Therapy: RLE LAD grade V    [facility] Adult PT Treatment:                                                DATE: 06/26/2022 Therapeutic Exercise: Shoulder IR/ER 1 x 10 with RTB Hamstring stretch PNF contract/ relax with 10 sec contraction x 30 seconds stretch RLE SLR 1 x 10  PATIENT EDUCATION: Education details: evaluation findings, POC, goals, HEP with proper form  Person educated: Patient Education method: Explanation, Verbal cues, and Handouts Education comprehension: verbalized understanding  HOME EXERCISE PROGRAM: Access Code: [email or web address] URL: [email or web address] Date: 06/26/2022 Prepared by: Lulu Riding  Exercises - Standing Shoulder Row  with Anchored Resistance  - 1 x daily - 7 x weekly - 2 sets - 10 reps - Shoulder Internal Rotation  - 1 x daily - 7 x weekly - 2 sets - 10 reps - Shoulder External Rotation  - 1 x daily - 7 x weekly - 2 sets - 10 reps - Lower Trunk Rotation  - 1 x daily - 7 x weekly - 2 sets - 10 reps - Seated Flexion Stretch  - 3 x daily - 7 x weekly - 1-2 sets - 2-3 reps - 30-60 hold - Seated Flexion Stretch with Swiss Ball  - 3 x daily - 7 x weekly - 1-2 sets - 2-3 reps - 30-60 sec hold - Seated Hamstring Stretch  - 1 x daily - 7 x weekly - 2 sets - 2 reps - 30 hold - SLR  - 1 x daily - 7 x weekly - 2 sets - 10-15 reps - 1 hold 07/18/22 - Supine Shoulder Horizontal Abduction with Resistance  - 1 x daily - 7 x weekly - 1 sets - 10 reps - Sidelying Shoulder Abduction Palm Forward  - 1 x daily - 7 x weekly - 1-2 sets - 10 reps  ASSESSMENT:  CLINICAL IMPRESSION: Pt arrives to session reporting that her back pain is getting better however left shoulder is still painful and limited in abduction. Continued with scapular mobility in multiple planes. Able to complete sidelying abduction to without discomfort. Progress lumbar stability to bridge without increased pain.  She reported that shoulder felt good at end of session however unable to lift past 70 degrees abduction in gravity resisted plane. Her HEP was updated.    Evaluation: Patient is a 52 y.o. F who was seen today for physical therapy evaluation and treatment for dx of L shoulder instability and low back pain. She has functional shoulder ROM with the exception of abduction secondary to pain, LUE presents with gross weakness compared bil. Low back she demonstrates TTP along the L1-L2 spinous process and R SIJ. Performed a test / treat/ re-test and she responded favorably with hamstring stretching and R hip flexor MET via SLR. She noed improvement of pain and ease of trunk mobility after suggesting potentiall SIJ involvement. She would benefit from physical  therapy to promote L shoulder strengthening / stability, improve pain free ROM, increase trunk mobility, reduce pain and improve postural awareness while maximizing her function by addressing the deficits listed.    OBJECTIVE IMPAIRMENTS: decreased activity tolerance, decreased endurance, decreased ROM, decreased strength, increased muscle spasms, improper body mechanics, postural dysfunction, and pain.   ACTIVITY LIMITATIONS: carrying, lifting, bending, sitting, standing, squatting, and reach over head  PARTICIPATION LIMITATIONS: cleaning, laundry, shopping, community activity, and occupation  PERSONAL FACTORS: Age, Past/current experiences, Time since onset of injury/illness/exacerbation, and 1-2 comorbidities: hx of dislocations and pre-diabetes  are also affecting patient's functional outcome.   REHAB POTENTIAL: Good  CLINICAL DECISION MAKING: Evolving/moderate complexity  EVALUATION COMPLEXITY: Moderate  GOALS: Goals reviewed with patient? Yes  SHORT TERM GOALS: Target date: 07/24/2022   PT to be IND with initial HEP for therapeutic progression Baseline: No previous HEP Goal status: INITIAL  2.  Pt to verbalize/ demo efficient posture and lifting mechanics to prevent and reduce L shoulder and low back pain  Baseline: no knowledge of posture Goal status: INITIAL  3.  Pt to report low back pain reducing to </= 5/10 at worst to demo improving function Baseline: worst 10/10 pain Goal status: INITIAL  4.  Increase L shoulder abduction by >/= 10 degrees to demo improving shoulder stability with </= 5/10 pain  Baseline: see flow sheet Goal status: INITIAL   LONG TERM GOALS: Target date: 08/21/2022   Increase L shoulder abduction and all other movements to [facility] compared bil with </= 2/10 pain and no report of feeling unstable Baseline: see goals Goal status: INITIAL  2.  Pt to be able to to sit, stand for >/= 60 min with </=2/10  report of back pain to assist with endurance  required for work  Baseline: reports pain with work  Goal status: INITIAL  3.  Increase LUE gross strength to >/= 4/5 to promote stability and assist with functional strength required for ADLs Baseline:  Goal status: INITIAL  4.  Increase FOTO score for both back and shoulder to >/= 63% to demo improvement in function Baseline:  Goal status: INITIAL  5.  Pt to report no back spasm for >/= 3 weeks to demonstrate improvement in posture and QOL.  Baseline:  Goal status: INITIAL  6.  Pt to be IND with all HEP and will be able to maintain and progress their current LOF IND. Baseline:  Goal status: INITIAL  PLAN: PT FREQUENCY: 1-2x/week  PT DURATION: 8 weeks  PLANNED INTERVENTIONS: Therapeutic exercises, Therapeutic activity, Neuromuscular re-education, Balance training, Gait training, Patient/Family education, Self Care, Joint mobilization, Dry Needling, Cryotherapy, Moist heat, Taping, Manual therapy, and Re-evaluation  PLAN FOR NEXT SESSION: Review/ update HEP PRN. Posture education with focus on sitting for long periods of time. STW along lumbar paraspainsl, potential for SIJ involvement on the R. L scapular strengthening.   Jannette Spanner, PTA 07/18/22 3:17 PM Phone: [phone] Fax: [phone]

## 2022-07-25 ENCOUNTER — Ambulatory Visit: Payer: BC Managed Care – PPO | Admitting: Physical Therapy

## 2022-07-25 ENCOUNTER — Encounter: Payer: Self-pay | Admitting: Physical Therapy

## 2022-07-25 DIAGNOSIS — G8929 Other chronic pain: Secondary | ICD-10-CM

## 2022-07-25 DIAGNOSIS — M25512 Pain in left shoulder: Secondary | ICD-10-CM | POA: Diagnosis not present

## 2022-07-25 DIAGNOSIS — M5459 Other low back pain: Secondary | ICD-10-CM | POA: Diagnosis not present

## 2022-07-25 DIAGNOSIS — M6281 Muscle weakness (generalized): Secondary | ICD-10-CM | POA: Diagnosis not present

## 2022-07-25 NOTE — Therapy (Signed)
OUTPATIENT PHYSICAL THERAPY TREATMENT   Patient Name: Valerie Fletcher MRN: [id number] DOB:1971-02-12, 52 y.o., female Today's Date: 07/25/2022  END OF SESSION:  PT End of Session - 07/25/22 1429     Visit Number 4    Number of Visits 9    Date for PT Re-Evaluation 08/21/22    Authorization Type [employer] + [employer]    Authorization Time Period 07/10/2022 - 10/07/2022    Authorization - Visit Number 3    Authorization - Number of Visits 12    PT Start Time 1429   pt arrived late   PT Stop Time 1513    PT Time Calculation (min) 44 min    Activity Tolerance Patient tolerated treatment well    Behavior During Therapy [facility] for tasks assessed/performed               Past Medical History:  Diagnosis Date   Asthma    uses inhaler   Constipation    Fatigue 02/14/2017   GERD (gastroesophageal reflux disease)    takes OTC meds as well as daily;   Hyperlipidemia    on meds   Hypertension    on meds   Iron deficiency anemia 04/08/2018   hx of   Seasonal allergies    Vaginal candidiasis 10/05/2017   Vertigo 11/20/2018   Viral upper respiratory tract infection 05/01/2018   Past Surgical History:  Procedure Laterality Date   DILATION AND CURETTAGE OF UTERUS     HERNIA REPAIR     Umbilical hernia x2, Hiatial hernia   TUBAL LIGATION     UPPER GASTROINTESTINAL ENDOSCOPY     Patient Active Problem List   Diagnosis Date Noted   Fibrocystic disease of breast 09/21/2021   Breast mass 09/05/2021   Migraines 04/18/2021   Positive COVID-19 test 03/31/2021   Muscle spasm 11/25/2020   Preventive measure 11/25/2020   Prediabetes 04/08/2018   Hyperlipidemia 06/30/2017   Rapid palpitations 06/29/2017   Hypertension 02/14/2017   GERD (gastroesophageal reflux disease) 02/14/2017   Asthma 02/14/2017    PCP: ones, Wayland Salinas, PA-C   REFERRING PROVIDER:  Nadyne Coombes, MD   REFERRING DIAG: Other instability, left shoulder [M25.312]  Lumbar spine pain  THERAPY DIAG:   Other low back pain  Chronic left shoulder pain  Rationale for Evaluation and Treatment: Rehabilitation  ONSET DATE: back the last few months  SUBJECTIVE:                                                                                                                                                                                      SUBJECTIVE STATEMENT: "I did a lot since the  last session including wash the dog which might have bothered my shoulder."  PERTINENT HISTORY: Hx of pre-diabetes,   PAIN:  shoulder Are you having pain? Yes: NPRS scale: 8/10 currently, at worst 7/10 Pain location: unsure Pain description: uncomfortable.  Aggravating factors: reach acrossing, Laying on the L shoulder Relieving factors: stop using the shoulder,      back Are you having pain? Yes: NPRS scale: 6/10 currently Pain location: Middle low back  Pain description: Dull, stiff Aggravating factors: Prolonged sitting, Lifting Relieving factors: take some ibuprofen, heating pack   PRECAUTIONS: None  WEIGHT BEARING RESTRICTIONS: No  FALLS:  Has patient fallen in last 6 months? No  LIVING ENVIRONMENT: Lives with: lives with their family Lives in: House/apartment Stairs: No Has following equipment at home: None  OCCUPATION: Release of information specialistist.   PLOF: Independent  PATIENT GOALS: How to handle this at home and decease the pain    OBJECTIVE:   DIAGNOSTIC FINDINGS:  X-ray L shoulder 4/12 FINDINGS: There is no evidence of fracture or dislocation. There is no evidence of arthropathy or other focal bone abnormality. Soft tissues are unremarkable.   IMPRESSION: Negative.   PATIENT SURVEYS :  FOTO Back 53% and predicted 63%  Shoulder 59% and predicted 64% COGNITION: Overall cognitive status: Within functional limits for tasks assessed     SENSATION: Light touch: WFL and Except for LUE at C3 ( diminished sensation)  POSTURE: Forward head posture, anteriorly  rotated shoulders.   UPPER EXTREMITY ROM:   Active ROM Right eval Left eval Left 07/25/2022  Shoulder flexion [facility]   Shoulder extension     Shoulder abduction WFL 72 P! 92!  Shoulder adduction     Shoulder internal rotation WFL in neutral) WFL (in netural)   Shoulder external rotation     Elbow flexion     Elbow extension     Wrist flexion     Wrist extension     Wrist ulnar deviation     Wrist radial deviation     Wrist pronation     Wrist supination     (Blank rows = not tested)  UPPER EXTREMITY MMT:  MMT Right eval Left eval  Shoulder flexion 4 4-  Shoulder extension 4 4-  Shoulder abduction 4 3  Shoulder adduction    Shoulder internal rotation 4 3+  Shoulder external rotation 4 3+  Middle trapezius    Lower trapezius    Elbow flexion    Elbow extension    Wrist flexion    Wrist extension    Wrist ulnar deviation    Wrist radial deviation    Wrist pronation    Wrist supination    Grip strength (lbs)    (Blank rows = not tested)   LUMBAR ROM:   Active  A/PROM  06/26/2022  Flexion 62  Extension 18  Right lateral flexion 12  Left lateral flexion 12  Right rotation   Left rotation    (Blank rows = not tested)  LE ROM:  Active  Right 06/26/2022 Left 06/26/2022  Hip flexion    Hip extension    Hip abduction    Hip adduction    Hip internal rotation    Hip external rotation    Knee flexion    Knee extension    Ankle dorsiflexion    Ankle plantarflexion    Ankle inversion    Ankle eversion     (Blank rows = not tested)  LE MMT:  MMT Right 06/26/2022  Left 06/26/2022  Hip flexion 4 4-  Hip extension 4 4  Hip abduction 4+ 4+  Hip adduction 4+ 4+  Hip internal rotation    Hip external rotation    Knee flexion    Knee extension    Ankle dorsiflexion    Ankle plantarflexion    Ankle inversion    Ankle eversion     (Blank rows = not tested)   GAIT: Distance walked: 156ft (to tx area) Assistive device utilized: None Level of  assistance: Complete Independence Comments: unremarkable.  SHOULDER SPECIAL TESTS: Withheld testing at this time due     PALPATION:  TTP along the R lumbar paraspinals at L1-L5, Tenderness noted at the L1-L2 segment  TTP for L shoulder along the infraspinatus and teres minor, and upper trap with mulitple trigger points noted   TODAY'S TREATMENT:                                                                                                                                         [facility] Adult PT Treatment:                                                DATE: 07/25/2022 Therapeutic Exercise: UBE L4 x 5 min (FWD/BWD x 5 min )  EMOM x 3 sets L ER in  R sidelying x 20 L abduction in R sidelying x 20 L Rows with RTB x 20 L Seated scaption x 20 L shoulder IR x 20 RTB  Upper trap stretch 2 x 30 sec on L Manual Therapy: MTPR L infraspinatus/ supraspinatus/ upper trap and middle deltoid    [facility] Adult PT Treatment:                                                DATE: 07/18/22 Therapeutic Exercise: Side lying shoulder flexion to 90 x 10 Side lying shoulder horizontal abduction (from 90 flexion) x 10 Side lying shoulder abduction to 90 x 10 Side lying ER AROM x 10, 1# x 12, 2# x 10 Prone Row x 10  Prone ext with retraction x 10 to neutral  Prone horizontal abduction x 8 thumb up, x 8 thumb out  Seated lumbar flexion stretch with ball 30 sec x 2  Supine red band shoulder horiz ER 2 x 10  SLR with ab brace x 15 each  Supine Marching with cues for ab bracing  PPT to Bridge x 15 LTR     [facility] Adult PT Treatment:  DATE: 07/11/2022 Therapeutic Exercise: Seated hamstring stretch 2 x 30 sec  SLR 2 x 10 with 3 count up/ down Supine marching alternating L/R while maintaining posterior pelvic tilt 2 x 20 (tactile cues for proper form) LTR 1 x 15 Seated shoulder ER 2 x 10 (modified band to YTB) Seated shoulder IR 2 x 10 with RTB Manual  Therapy: RLE LAD grade V    [facility] Adult PT Treatment:                                                DATE: 06/26/2022 Therapeutic Exercise: Shoulder IR/ER 1 x 10 with RTB Hamstring stretch PNF contract/ relax with 10 sec contraction x 30 seconds stretch RLE SLR 1 x 10  PATIENT EDUCATION: Education details: evaluation findings, POC, goals, HEP with proper form Person educated: Patient Education method: Explanation, Verbal cues, and Handouts Education comprehension: verbalized understanding  HOME EXERCISE PROGRAM: Access Code: [email or web address] URL: [email or web address] Date: 06/26/2022 Prepared by: Lulu Riding  Exercises - Standing Shoulder Row with Anchored Resistance  - 1 x daily - 7 x weekly - 2 sets - 10 reps - Shoulder Internal Rotation  - 1 x daily - 7 x weekly - 2 sets - 10 reps - Shoulder External Rotation  - 1 x daily - 7 x weekly - 2 sets - 10 reps - Lower Trunk Rotation  - 1 x daily - 7 x weekly - 2 sets - 10 reps - Seated Flexion Stretch  - 3 x daily - 7 x weekly - 1-2 sets - 2-3 reps - 30-60 hold - Seated Flexion Stretch with Swiss Ball  - 3 x daily - 7 x weekly - 1-2 sets - 2-3 reps - 30-60 sec hold - Seated Hamstring Stretch  - 1 x daily - 7 x weekly - 2 sets - 2 reps - 30 hold - SLR  - 1 x daily - 7 x weekly - 2 sets - 10-15 reps - 1 hold 07/18/22 - Supine Shoulder Horizontal Abduction with Resistance  - 1 x daily - 7 x weekly - 1 sets - 10 reps - Sidelying Shoulder Abduction Palm Forward  - 1 x daily - 7 x weekly - 1-2 sets - 10 reps  ASSESSMENT:  CLINICAL IMPRESSION: Pt arrives to PT today reporting increased l shoulder and low back pain as a result of doing more since her last session. She responded well to [facility] for the infraspinatus and middle deltoid. Continued working strengthening of the shoulder utilizing the rehab dose of EMOM. She did well with exercises today and would be able to progress resistance next session as she did very well. She is  progressing and met all her STG except for STG #3 today.    Evaluation: Patient is a 52 y.o. F who was seen today for physical therapy evaluation and treatment for dx of L shoulder instability and low back pain. She has functional shoulder ROM with the exception of abduction secondary to pain, LUE presents with gross weakness compared bil. Low back she demonstrates TTP along the L1-L2 spinous process and R SIJ. Performed a test / treat/ re-test and she responded favorably with hamstring stretching and R hip flexor MET via SLR. She noed improvement of pain and ease of trunk mobility after suggesting potentiall SIJ involvement. She would benefit from physical therapy to promote L shoulder  strengthening / stability, improve pain free ROM, increase trunk mobility, reduce pain and improve postural awareness while maximizing her function by addressing the deficits listed.    OBJECTIVE IMPAIRMENTS: decreased activity tolerance, decreased endurance, decreased ROM, decreased strength, increased muscle spasms, improper body mechanics, postural dysfunction, and pain.   ACTIVITY LIMITATIONS: carrying, lifting, bending, sitting, standing, squatting, and reach over head  PARTICIPATION LIMITATIONS: cleaning, laundry, shopping, community activity, and occupation  PERSONAL FACTORS: Age, Past/current experiences, Time since onset of injury/illness/exacerbation, and 1-2 comorbidities: hx of dislocations and pre-diabetes  are also affecting patient's functional outcome.   REHAB POTENTIAL: Good  CLINICAL DECISION MAKING: Evolving/moderate complexity  EVALUATION COMPLEXITY: Moderate  GOALS: Goals reviewed with patient? Yes  SHORT TERM GOALS: Target date: 07/24/2022   PT to be IND with initial HEP for therapeutic progression Baseline: No previous HEP Goal status: MET 07/25/2022  2.  Pt to verbalize/ demo efficient posture and lifting mechanics to prevent and reduce L shoulder and low back pain Baseline: no  knowledge of posture Goal status: MET 07/25/2022  3.  Pt to report low back pain reducing to </= 5/10 at worst to demo improving function Baseline: worst 10/10 pain Status: 6/10 or the back  Goal status: ongoing 07/25/2022  4.  Increase L shoulder abduction by >/= 10 degrees to demo improving shoulder stability with </= 5/10 pain  Baseline: see flow sheet Goal status: MET 07/25/2022   LONG TERM GOALS: Target date: 08/21/2022   Increase L shoulder abduction and all other movements to [facility] compared bil with </= 2/10 pain and no report of feeling unstable Baseline: see goals Goal status: INITIAL  2.  Pt to be able to to sit, stand for >/= 60 min with </=2/10  report of back pain to assist with endurance required for work  Baseline: reports pain with work  Goal status: INITIAL  3.  Increase LUE gross strength to >/= 4/5 to promote stability and assist with functional strength required for ADLs Baseline:  Goal status: INITIAL  4.  Increase FOTO score for both back and shoulder to >/= 63% to demo improvement in function Baseline:  Goal status: INITIAL  5.  Pt to report no back spasm for >/= 3 weeks to demonstrate improvement in posture and QOL.  Baseline:  Goal status: INITIAL  6.  Pt to be IND with all HEP and will be able to maintain and progress their current LOF IND. Baseline:  Goal status: INITIAL  PLAN: PT FREQUENCY: 1-2x/week  PT DURATION: 8 weeks  PLANNED INTERVENTIONS: Therapeutic exercises, Therapeutic activity, Neuromuscular re-education, Balance training, Gait training, Patient/Family education, Self Care, Joint mobilization, Dry Needling, Cryotherapy, Moist heat, Taping, Manual therapy, and Re-evaluation  PLAN FOR NEXT SESSION: Review/ update HEP PRN. Posture education with focus on sitting for long periods of time. STW along lumbar paraspainsl, potential for SIJ involvement on the R. L scapular strengthening.   Tylyn Stankovich PT, DPT, LAT, ATC  07/25/22  3:23  PM

## 2022-08-01 ENCOUNTER — Ambulatory Visit: Payer: BC Managed Care – PPO | Attending: Internal Medicine | Admitting: Physical Therapy

## 2022-08-01 DIAGNOSIS — G8929 Other chronic pain: Secondary | ICD-10-CM | POA: Insufficient documentation

## 2022-08-01 DIAGNOSIS — M5459 Other low back pain: Secondary | ICD-10-CM | POA: Insufficient documentation

## 2022-08-01 DIAGNOSIS — M25512 Pain in left shoulder: Secondary | ICD-10-CM | POA: Diagnosis not present

## 2022-08-01 DIAGNOSIS — M6281 Muscle weakness (generalized): Secondary | ICD-10-CM | POA: Diagnosis not present

## 2022-08-01 NOTE — Therapy (Signed)
OUTPATIENT PHYSICAL THERAPY TREATMENT   Patient Name: Valerie Fletcher MRN: [id number] DOB:12-20-1970, 52 y.o., female Today's Date: 08/01/2022  END OF SESSION:  PT End of Session - 08/01/22 1420     Visit Number 5    Number of Visits 9    Date for PT Re-Evaluation 08/21/22    Authorization Type [employer] + [employer]    Authorization Time Period 07/10/2022 - 10/07/2022    Authorization - Visit Number 4    Authorization - Number of Visits 12    PT Start Time 1419    Activity Tolerance Patient tolerated treatment well    Behavior During Therapy [facility] for tasks assessed/performed                Past Medical History:  Diagnosis Date   Asthma    uses inhaler   Constipation    Fatigue 02/14/2017   GERD (gastroesophageal reflux disease)    takes OTC meds as well as daily;   Hyperlipidemia    on meds   Hypertension    on meds   Iron deficiency anemia 04/08/2018   hx of   Seasonal allergies    Vaginal candidiasis 10/05/2017   Vertigo 11/20/2018   Viral upper respiratory tract infection 05/01/2018   Past Surgical History:  Procedure Laterality Date   DILATION AND CURETTAGE OF UTERUS     HERNIA REPAIR     Umbilical hernia x2, Hiatial hernia   TUBAL LIGATION     UPPER GASTROINTESTINAL ENDOSCOPY     Patient Active Problem List   Diagnosis Date Noted   Fibrocystic disease of breast 09/21/2021   Breast mass 09/05/2021   Migraines 04/18/2021   Positive COVID-19 test 03/31/2021   Muscle spasm 11/25/2020   Preventive measure 11/25/2020   Prediabetes 04/08/2018   Hyperlipidemia 06/30/2017   Rapid palpitations 06/29/2017   Hypertension 02/14/2017   GERD (gastroesophageal reflux disease) 02/14/2017   Asthma 02/14/2017    PCP: ones, Wayland Salinas, PA-C   REFERRING PROVIDER:  Nadyne Coombes, MD   REFERRING DIAG: Other instability, left shoulder [M25.312]  Lumbar spine pain  THERAPY DIAG:  Other low back pain  Chronic left shoulder pain  Muscle weakness  (generalized)  Rationale for Evaluation and Treatment: Rehabilitation  ONSET DATE: back the last few months  SUBJECTIVE:                                                                                                                                                                                      SUBJECTIVE STATEMENT: "Overall I am doing pretty good, I did those pressure points and it does helps."  PERTINENT HISTORY: Hx of pre-diabetes,  PAIN:  shoulder Are you having pain? Yes: NPRS scale: 0/10 currently, at worst 7/10 Pain location: unsure Pain description: uncomfortable.  Aggravating factors: reach acrossing, Laying on the L shoulder Relieving factors: stop using the shoulder,      back Are you having pain? Yes: NPRS scale: 0/10 currently Pain location: Middle low back  Pain description: Dull, stiff Aggravating factors: Prolonged sitting, Lifting Relieving factors: take some ibuprofen, heating pack   PRECAUTIONS: None  WEIGHT BEARING RESTRICTIONS: No  FALLS:  Has patient fallen in last 6 months? No  LIVING ENVIRONMENT: Lives with: lives with their family Lives in: House/apartment Stairs: No Has following equipment at home: None  OCCUPATION: Release of information specialistist.   PLOF: Independent  PATIENT GOALS: How to handle this at home and decease the pain    OBJECTIVE:   DIAGNOSTIC FINDINGS:  X-ray L shoulder 4/12 FINDINGS: There is no evidence of fracture or dislocation. There is no evidence of arthropathy or other focal bone abnormality. Soft tissues are unremarkable.   IMPRESSION: Negative.   PATIENT SURVEYS :  FOTO Back 53% and predicted 63%  Shoulder 59% and predicted 64% COGNITION: Overall cognitive status: Within functional limits for tasks assessed     SENSATION: Light touch: WFL and Except for LUE at C3 ( diminished sensation)  POSTURE: Forward head posture, anteriorly rotated shoulders.   UPPER EXTREMITY ROM:   Active  ROM Right eval Left eval Left 07/25/2022  Shoulder flexion [facility]   Shoulder extension     Shoulder abduction WFL 72 P! 92!  Shoulder adduction     Shoulder internal rotation WFL in neutral) WFL (in netural)   Shoulder external rotation     Elbow flexion     Elbow extension     Wrist flexion     Wrist extension     Wrist ulnar deviation     Wrist radial deviation     Wrist pronation     Wrist supination     (Blank rows = not tested)  UPPER EXTREMITY MMT:  MMT Right eval Left eval  Shoulder flexion 4 4-  Shoulder extension 4 4-  Shoulder abduction 4 3  Shoulder adduction    Shoulder internal rotation 4 3+  Shoulder external rotation 4 3+  Middle trapezius    Lower trapezius    Elbow flexion    Elbow extension    Wrist flexion    Wrist extension    Wrist ulnar deviation    Wrist radial deviation    Wrist pronation    Wrist supination    Grip strength (lbs)    (Blank rows = not tested)   LUMBAR ROM:   Active  A/PROM  06/26/2022  Flexion 62  Extension 18  Right lateral flexion 12  Left lateral flexion 12  Right rotation   Left rotation    (Blank rows = not tested)  LE ROM:  Active  Right 06/26/2022 Left 06/26/2022  Hip flexion    Hip extension    Hip abduction    Hip adduction    Hip internal rotation    Hip external rotation    Knee flexion    Knee extension    Ankle dorsiflexion    Ankle plantarflexion    Ankle inversion    Ankle eversion     (Blank rows = not tested)  LE MMT:  MMT Right 06/26/2022 Left 06/26/2022  Hip flexion 4 4-  Hip extension 4 4  Hip abduction 4+ 4+  Hip adduction  4+ 4+  Hip internal rotation    Hip external rotation    Knee flexion    Knee extension    Ankle dorsiflexion    Ankle plantarflexion    Ankle inversion    Ankle eversion     (Blank rows = not tested)   GAIT: Distance walked: 175ft (to tx area) Assistive device utilized: None Level of assistance: Complete Independence Comments:  unremarkable.  SHOULDER SPECIAL TESTS: Withheld testing at this time due     PALPATION:  TTP along the R lumbar paraspinals at L1-L5, Tenderness noted at the L1-L2 segment  TTP for L shoulder along the infraspinatus and teres minor, and upper trap with mulitple trigger points noted   TODAY'S TREATMENT:                                                                                                                                         [facility] Adult PT Treatment:                                                DATE: 08/01/2022 Therapeutic Exercise: UBE L5 x 4 min (FWD/BWD) Standing rows 2 x 15 with GTB Standing shoulder extension 2 x 15 with GTB Standing shoulder scaption 2 x 20 with 1# Ellipitical L1 x ramp L1 x 4 min  R sidelying L shoulder ER into flexion into horixzontal abduction 1 x 2 min no weight, then 1 x 2 min with 1# Seated shoulder overhead press 2 x 10 - verbal cues to keep hands in peripheral vision.   Manual Therapy: MTPR L lateral infraspinatus   [facility] Adult PT Treatment:                                                DATE: 07/25/2022 Therapeutic Exercise: UBE L4 x 5 min (FWD/BWD x 5 min )  EMOM x 3 sets L ER in  R sidelying x 20 L abduction in R sidelying x 20 L Rows with RTB x 20 L Seated scaption x 20 L shoulder IR x 20 RTB  Upper trap stretch 2 x 30 sec on L Manual Therapy: MTPR L infraspinatus/ supraspinatus/ upper trap and middle deltoid    [facility] Adult PT Treatment:                                                DATE: 07/18/22 Therapeutic Exercise: Side lying shoulder flexion to 90 x 10 Side lying shoulder horizontal abduction (from 90 flexion)  x 10 Side lying shoulder abduction to 90 x 10 Side lying ER AROM x 10, 1# x 12, 2# x 10 Prone Row x 10  Prone ext with retraction x 10 to neutral  Prone horizontal abduction x 8 thumb up, x 8 thumb out  Seated lumbar flexion stretch with ball 30 sec x 2  Supine red band shoulder horiz ER 2 x 10  SLR with ab  brace x 15 each  Supine Marching with cues for ab bracing  PPT to Bridge x 15 LTR   PATIENT EDUCATION: Education details: evaluation findings, POC, goals, HEP with proper form Person educated: Patient Education method: Explanation, Verbal cues, and Handouts Education comprehension: verbalized understanding  HOME EXERCISE PROGRAM: Access Code: [email or web address] URL: [email or web address] Date: 06/26/2022 Prepared by: Lulu Riding  Exercises - Standing Shoulder Row with Anchored Resistance  - 1 x daily - 7 x weekly - 2 sets - 10 reps - Shoulder Internal Rotation  - 1 x daily - 7 x weekly - 2 sets - 10 reps - Shoulder External Rotation  - 1 x daily - 7 x weekly - 2 sets - 10 reps - Lower Trunk Rotation  - 1 x daily - 7 x weekly - 2 sets - 10 reps - Seated Flexion Stretch  - 3 x daily - 7 x weekly - 1-2 sets - 2-3 reps - 30-60 hold - Seated Flexion Stretch with Swiss Ball  - 3 x daily - 7 x weekly - 1-2 sets - 2-3 reps - 30-60 sec hold - Seated Hamstring Stretch  - 1 x daily - 7 x weekly - 2 sets - 2 reps - 30 hold - SLR  - 1 x daily - 7 x weekly - 2 sets - 10-15 reps - 1 hold 07/18/22 - Supine Shoulder Horizontal Abduction with Resistance  - 1 x daily - 7 x weekly - 1 sets - 10 reps - Sidelying Shoulder Abduction Palm Forward  - 1 x daily - 7 x weekly - 1-2 sets - 10 reps  ASSESSMENT:  CLINICAL IMPRESSION: Valerie Fletcher is making great progress with physical therapy reporting no pain in both her shoulder and back today. Continued working mostly on the shoulder with strengthening which she continues to do very well with.  Plan to continue working on shoulder with overhead activities and back strengthening.    Evaluation: Patient is a 52 y.o. F who was seen today for physical therapy evaluation and treatment for dx of L shoulder instability and low back pain. She has functional shoulder ROM with the exception of abduction secondary to pain, LUE presents with gross weakness compared  bil. Low back she demonstrates TTP along the L1-L2 spinous process and R SIJ. Performed a test / treat/ re-test and she responded favorably with hamstring stretching and R hip flexor MET via SLR. She noed improvement of pain and ease of trunk mobility after suggesting potentiall SIJ involvement. She would benefit from physical therapy to promote L shoulder strengthening / stability, improve pain free ROM, increase trunk mobility, reduce pain and improve postural awareness while maximizing her function by addressing the deficits listed.    OBJECTIVE IMPAIRMENTS: decreased activity tolerance, decreased endurance, decreased ROM, decreased strength, increased muscle spasms, improper body mechanics, postural dysfunction, and pain.   ACTIVITY LIMITATIONS: carrying, lifting, bending, sitting, standing, squatting, and reach over head  PARTICIPATION LIMITATIONS: cleaning, laundry, shopping, community activity, and occupation  PERSONAL FACTORS: Age, Past/current experiences, Time since onset of injury/illness/exacerbation, and 1-2 comorbidities: hx of  dislocations and pre-diabetes  are also affecting patient's functional outcome.   REHAB POTENTIAL: Good  CLINICAL DECISION MAKING: Evolving/moderate complexity  EVALUATION COMPLEXITY: Moderate  GOALS: Goals reviewed with patient? Yes  SHORT TERM GOALS: Target date: 07/24/2022   PT to be IND with initial HEP for therapeutic progression Baseline: No previous HEP Goal status: MET 07/25/2022  2.  Pt to verbalize/ demo efficient posture and lifting mechanics to prevent and reduce L shoulder and low back pain Baseline: no knowledge of posture Goal status: MET 07/25/2022  3.  Pt to report low back pain reducing to </= 5/10 at worst to demo improving function Baseline: worst 10/10 pain Status: 6/10 or the back  Goal status: ongoing 07/25/2022  4.  Increase L shoulder abduction by >/= 10 degrees to demo improving shoulder stability with </= 5/10 pain   Baseline: see flow sheet Goal status: MET 07/25/2022   LONG TERM GOALS: Target date: 08/21/2022   Increase L shoulder abduction and all other movements to [facility] compared bil with </= 2/10 pain and no report of feeling unstable Baseline: see goals Goal status: INITIAL  2.  Pt to be able to to sit, stand for >/= 60 min with </=2/10  report of back pain to assist with endurance required for work  Baseline: reports pain with work  Goal status: INITIAL  3.  Increase LUE gross strength to >/= 4/5 to promote stability and assist with functional strength required for ADLs Baseline:  Goal status: INITIAL  4.  Increase FOTO score for both back and shoulder to >/= 63% to demo improvement in function Baseline:  Goal status: INITIAL  5.  Pt to report no back spasm for >/= 3 weeks to demonstrate improvement in posture and QOL.  Baseline:  Goal status: INITIAL  6.  Pt to be IND with all HEP and will be able to maintain and progress their current LOF IND. Baseline:  Goal status: INITIAL  PLAN: PT FREQUENCY: 1-2x/week  PT DURATION: 8 weeks  PLANNED INTERVENTIONS: Therapeutic exercises, Therapeutic activity, Neuromuscular re-education, Balance training, Gait training, Patient/Family education, Self Care, Joint mobilization, Dry Needling, Cryotherapy, Moist heat, Taping, Manual therapy, and Re-evaluation  PLAN FOR NEXT SESSION: Review/ update HEP PRN. STW along lumbar paraspainsl, potential for SIJ involvement on the R. L scapular strengthening, overhead strengthening/ activities   Shaunice Levitan PT, DPT, LAT, ATC  08/01/22  3:08 PM

## 2022-08-04 ENCOUNTER — Ambulatory Visit (AMBULATORY_SURGERY_CENTER): Payer: BC Managed Care – PPO | Admitting: Gastroenterology

## 2022-08-04 ENCOUNTER — Encounter: Payer: Self-pay | Admitting: Gastroenterology

## 2022-08-04 VITALS — BP 100/69 | HR 88 | Temp 97.1°F | Resp 14 | Ht 64.0 in | Wt 173.0 lb

## 2022-08-04 DIAGNOSIS — D120 Benign neoplasm of cecum: Secondary | ICD-10-CM | POA: Diagnosis not present

## 2022-08-04 DIAGNOSIS — R195 Other fecal abnormalities: Secondary | ICD-10-CM | POA: Diagnosis not present

## 2022-08-04 DIAGNOSIS — Z1211 Encounter for screening for malignant neoplasm of colon: Secondary | ICD-10-CM

## 2022-08-04 MED ORDER — SODIUM CHLORIDE 0.9 % IV SOLN
500.0000 mL | INTRAVENOUS | Status: DC
Start: 2022-08-04 — End: 2022-08-04

## 2022-08-04 NOTE — Progress Notes (Signed)
Uneventful anesthetic. Report to pacu rn. Vss. Care resumed by rn. 

## 2022-08-04 NOTE — Op Note (Signed)
[facility] Patient Name: Valerie Fletcher Procedure Date: 08/04/2022 1:15 PM MRN: [id number] Endoscopist: Sherilyn Cooter L. Myrtie Neither , MD, [id number] Age: 52 Referring MD:  Date of Birth: 04-13-1970 Gender: Female Account #: [id number] Procedure:                Colonoscopy Indications:              Positive Cologuard test Medicines:                Monitored Anesthesia Care Procedure:                Pre-Anesthesia Assessment:                           - Prior to the procedure, a History and Physical                            was performed, and patient medications and                            allergies were reviewed. The patient's tolerance of                            previous anesthesia was also reviewed. The risks                            and benefits of the procedure and the sedation                            options and risks were discussed with the patient.                            All questions were answered, and informed consent                            was obtained. Prior Anticoagulants: The patient has                            taken no anticoagulant or antiplatelet agents. ASA                            Grade Assessment: II - A patient with mild systemic                            disease. After reviewing the risks and benefits,                            the patient was deemed in satisfactory condition to                            undergo the procedure.                           After obtaining informed consent, the colonoscope  was passed under direct vision. Throughout the                            procedure, the patient's blood pressure, pulse, and                            oxygen saturations were monitored continuously. The                            Olympus SN [id number] was introduced through the anus                            and advanced to the the cecum, identified by                            appendiceal orifice and ileocecal  valve. The                            colonoscopy was extremely difficult due to a                            redundant colon and significant looping. Successful                            completion of the procedure was aided by using                            manual pressure and straightening and shortening                            the scope to obtain bowel loop reduction. The                            patient tolerated the procedure well. The quality                            of the bowel preparation was good after lavage. The                            ileocecal valve, appendiceal orifice, and rectum                            were photographed. The bowel preparation used was                            SUPREP via split dose instruction. Scope In: 1:28:38 PM Scope Out: 1:59:14 PM Scope Withdrawal Time: 0 hours 16 minutes 29 seconds  Total Procedure Duration: 0 hours 30 minutes 36 seconds  Findings:                 The perianal and digital rectal examinations were                            normal.  The colon (entire examined portion) was                            significantly redundant.                           A diminutive polyp was found in the cecum. The                            polyp was semi-sessile. The polyp was removed with                            a cold snare. Resection and retrieval were complete.                           Internal hemorrhoids were found. The hemorrhoids                            were small.                           The exam was otherwise without abnormality on                            direct and retroflexion views. Complications:            No immediate complications. Estimated Blood Loss:     Estimated blood loss was minimal. Impression:               - Redundant colon.                           - One diminutive polyp in the cecum, removed with a                            cold snare. Resected and retrieved.                            - Internal hemorrhoids.                           - The examination was otherwise normal on direct                            and retroflexion views. Recommendation:           - Patient has a contact number available for                            emergencies. The signs and symptoms of potential                            delayed complications were discussed with the                            patient. Return to normal activities tomorrow.  Written discharge instructions were provided to the                            patient.                           - Resume previous diet.                           - Continue present medications.                           - Await pathology results.                           - Repeat colonoscopy is recommended for                            surveillance. The colonoscopy date will be                            determined after pathology results from today's                            exam become available for review. GOLYTELY PREP FOR                            ALL FUTURE COLONOSCOPIES (see prep details above). Infinity Jeffords L. Myrtie Neither, MD 08/04/2022 2:04:47 PM This report has been signed electronically.

## 2022-08-04 NOTE — Patient Instructions (Signed)
Handouts Provided:  Polyps  YOU HAD AN ENDOSCOPIC PROCEDURE TODAY AT THE [facility]:   Refer to the procedure report that was given to you for any specific questions about what was found during the examination.  If the procedure report does not answer your questions, please call your gastroenterologist to clarify.  If you requested that your care partner not be given the details of your procedure findings, then the procedure report has been included in a sealed envelope for you to review at your convenience later.  YOU SHOULD EXPECT: Some feelings of bloating in the abdomen. Passage of more gas than usual.  Walking can help get rid of the air that was put into your GI tract during the procedure and reduce the bloating. If you had a lower endoscopy (such as a colonoscopy or flexible sigmoidoscopy) you may notice spotting of blood in your stool or on the toilet paper. If you underwent a bowel prep for your procedure, you may not have a normal bowel movement for a few days.  Please Note:  You might notice some irritation and congestion in your nose or some drainage.  This is from the oxygen used during your procedure.  There is no need for concern and it should clear up in a day or so.  SYMPTOMS TO REPORT IMMEDIATELY:  Following lower endoscopy (colonoscopy or flexible sigmoidoscopy):  Excessive amounts of blood in the stool  Significant tenderness or worsening of abdominal pains  Swelling of the abdomen that is new, acute  Fever of 100F or higher  For urgent or emergent issues, a gastroenterologist can be reached at any hour by calling [phone]. Do not use MyChart messaging for urgent concerns.    DIET:  We do recommend a small meal at first, but then you may proceed to your regular diet.  Drink plenty of fluids but you should avoid alcoholic beverages for 24 hours.  ACTIVITY:  You should plan to take it easy for the rest of today and you should NOT DRIVE or use heavy  machinery until tomorrow (because of the sedation medicines used during the test).    FOLLOW UP: Our staff will call the number listed on your records the next business day following your procedure.  We will call around 7:15- 8:00 am to check on you and address any questions or concerns that you may have regarding the information given to you following your procedure. If we do not reach you, we will leave a message.     If any biopsies were taken you will be contacted by phone or by letter within the next 1-3 weeks.  Please call us at [phone] if you have not heard about the biopsies in 3 weeks.    SIGNATURES/CONFIDENTIALITY: You and/or your care partner have signed paperwork which will be entered into your electronic medical record.  These signatures attest to the fact that that the information above on your After Visit Summary has been reviewed and is understood.  Full responsibility of the confidentiality of this discharge information lies with you and/or your care-partner.  

## 2022-08-04 NOTE — Progress Notes (Signed)
Called to room to assist during endoscopic procedure.  Patient ID and intended procedure confirmed with present staff. Received instructions for my participation in the procedure from the performing physician.  

## 2022-08-04 NOTE — Progress Notes (Signed)
History and Physical:  This patient presents for endoscopic testing for: Encounter Diagnosis  Name Primary?   Positive colorectal cancer screening using Cologuard test Yes    Cologuard was this patient's first CRC screening test.   Patient is otherwise without complaints or active issues today.   Past Medical History: Past Medical History:  Diagnosis Date   Asthma    uses inhaler   Constipation    Fatigue 02/14/2017   GERD (gastroesophageal reflux disease)    takes OTC meds as well as daily;   Hyperlipidemia    on meds   Hypertension    on meds   Iron deficiency anemia 04/08/2018   hx of   Seasonal allergies    Vaginal candidiasis 10/05/2017   Vertigo 11/20/2018   Viral upper respiratory tract infection 05/01/2018     Past Surgical History: Past Surgical History:  Procedure Laterality Date   DILATION AND CURETTAGE OF UTERUS     HERNIA REPAIR     Umbilical hernia x2, Hiatial hernia   TUBAL LIGATION     UPPER GASTROINTESTINAL ENDOSCOPY      Allergies: Allergies  Allergen Reactions   Prednisone Anaphylaxis   Compazine [Prochlorperazine Edisylate] Other (See Comments)    "locks muscles"   Dilaudid [Hydromorphone] Itching   Hydromorphone Hcl Itching   Oxycodone-Acetaminophen Hives and Itching   Prochlorperazine     Other Reaction(s): Locks muscles, Locks muscles   Statins     Other Reaction(s): myalgia, tired, myalgia, tired    Outpatient Meds: Current Outpatient Medications  Medication Sig Dispense Refill   albuterol (PROVENTIL HFA;VENTOLIN HFA) 108 (90 Base) MCG/ACT inhaler Inhale 1 puff into the lungs every 4 (four) hours as needed for wheezing or shortness of breath.     amLODipine (NORVASC) 10 MG tablet Take 1 tablet (10 mg total) by mouth daily. TAKE 1 TABLET BY MOUTH EVERYDAY AT BEDTIME FOR BLOOD PRESSURE 90 tablet 3   carvedilol (COREG) 25 MG tablet Take 1 tablet (25 mg total) by mouth 2 (two) times daily with a meal. For high blood pressure 180  tablet 3   cyclobenzaprine (FLEXERIL) 10 MG tablet Take 10 mg by mouth at bedtime as needed for muscle spasms.     ezetimibe (ZETIA) 10 MG tablet Take 10 mg by mouth daily.     ondansetron (ZOFRAN) 4 MG tablet Take 1 tablet (4 mg total) by mouth every 8 (eight) hours as needed for nausea or vomiting. Take as prep instructions advise 2 tablet 0   pantoprazole (PROTONIX) 40 MG tablet Take 1 tablet (40 mg total) by mouth daily. Take 1 tablet 30 mins before food daily for acid reflux 90 tablet 3   Current Facility-Administered Medications  Medication Dose Route Frequency Provider Last Rate Last Admin   0.9 %  sodium chloride infusion  500 mL Intravenous Continuous Danis, Starr Lake III, MD          ___________________________________________________________________ Objective   Exam:  BP 126/80   Pulse 90   Temp (!) 97.1 F (36.2 C) (Skin)   Ht 5\' 4"  (1.626 m)   Wt 173 lb (78.5 kg)   SpO2 97%   BMI 29.70 kg/m   CV: regular , S1/S2 Resp: clear to auscultation bilaterally, normal RR and effort noted GI: soft, no tenderness, with active bowel sounds.   Assessment: Encounter Diagnosis  Name Primary?   Positive colorectal cancer screening using Cologuard test Yes     Plan: Colonoscopy   The benefits and risks of the  planned procedure were described in detail with the patient or (when appropriate) their health care proxy.  Risks were outlined as including, but not limited to, bleeding, infection, perforation, adverse medication reaction leading to cardiac or pulmonary decompensation, pancreatitis (if ERCP).  The limitation of incomplete mucosal visualization was also discussed.  No guarantees or warranties were given.  The patient is appropriate for an endoscopic procedure in the ambulatory setting.   - Amada Jupiter, MD

## 2022-08-04 NOTE — Progress Notes (Signed)
Patient states there have been no changes to medical or surgical history since time of pre-visit. 

## 2022-08-07 ENCOUNTER — Telehealth: Payer: Self-pay | Admitting: *Deleted

## 2022-08-07 ENCOUNTER — Encounter: Payer: Self-pay | Admitting: Physical Therapy

## 2022-08-07 ENCOUNTER — Ambulatory Visit: Payer: BC Managed Care – PPO | Admitting: Physical Therapy

## 2022-08-07 DIAGNOSIS — M5459 Other low back pain: Secondary | ICD-10-CM | POA: Diagnosis not present

## 2022-08-07 DIAGNOSIS — G8929 Other chronic pain: Secondary | ICD-10-CM

## 2022-08-07 DIAGNOSIS — M6281 Muscle weakness (generalized): Secondary | ICD-10-CM | POA: Diagnosis not present

## 2022-08-07 DIAGNOSIS — M25512 Pain in left shoulder: Secondary | ICD-10-CM | POA: Diagnosis not present

## 2022-08-07 NOTE — Telephone Encounter (Signed)
  Follow up Call-     08/04/2022    1:02 PM 07/06/2020    9:16 AM  Call back number  Post procedure Call Back phone  # [phone]  Permission to leave phone message Yes Yes     Patient questions:  Do you have a fever, pain , or abdominal swelling? No. Pain Score  0 *  Have you tolerated food without any problems? Yes.    Have you been able to return to your normal activities? Yes.    Do you have any questions about your discharge instructions: Diet   No. Medications  No. Follow up visit  No.  Do you have questions or concerns about your Care? No.  Actions: * If pain score is 4 or above: No action needed, pain <4.

## 2022-08-07 NOTE — Therapy (Addendum)
OUTPATIENT PHYSICAL THERAPY TREATMENT / Discharge  PHYSICAL THERAPY DISCHARGE SUMMARY  Visits from Start of Care: 6  Current functional level related to goals / functional outcomes: See goals   Remaining deficits: Current status unknown due to pt not returning   Education / Equipment: HEP   Patient agrees to discharge. Patient goals were partially met. Patient is being discharged due to not returning since the last visit.  Lulu Riding PT, DPT, LAT, ATC  01/22/23  8:52 AM        Patient Name: Valerie Fletcher MRN: [id number] DOB:1970-03-21, 52 y.o., female Today's Date: 08/07/2022  END OF SESSION:  PT End of Session - 08/07/22 0929     Visit Number 6    Number of Visits 9    Date for PT Re-Evaluation 08/21/22    Authorization Type [employer] + [employer]    Authorization - Visit Number 5    Authorization - Number of Visits 12    PT Start Time 0930    PT Stop Time 1014    PT Time Calculation (min) 44 min    Activity Tolerance Patient tolerated treatment well    Behavior During Therapy WFL for tasks assessed/performed                 Past Medical History:  Diagnosis Date   Asthma    uses inhaler   Constipation    Fatigue 02/14/2017   GERD (gastroesophageal reflux disease)    takes OTC meds as well as daily;   Hyperlipidemia    on meds   Hypertension    on meds   Iron deficiency anemia 04/08/2018   hx of   Seasonal allergies    Vaginal candidiasis 10/05/2017   Vertigo 11/20/2018   Viral upper respiratory tract infection 05/01/2018   Past Surgical History:  Procedure Laterality Date   DILATION AND CURETTAGE OF UTERUS     HERNIA REPAIR     Umbilical hernia x2, Hiatial hernia   TUBAL LIGATION     UPPER GASTROINTESTINAL ENDOSCOPY     Patient Active Problem List   Diagnosis Date Noted   Fibrocystic disease of breast 09/21/2021   Breast mass 09/05/2021   Migraines 04/18/2021   Positive COVID-19 test 03/31/2021   Muscle spasm  11/25/2020   Preventive measure 11/25/2020   Prediabetes 04/08/2018   Hyperlipidemia 06/30/2017   Rapid palpitations 06/29/2017   Hypertension 02/14/2017   GERD (gastroesophageal reflux disease) 02/14/2017   Asthma 02/14/2017    PCP: ones, Wayland Salinas, PA-C   REFERRING PROVIDER:  Nadyne Coombes, MD   REFERRING DIAG: Other instability, left shoulder [M25.312]  Lumbar spine pain  THERAPY DIAG:  Other low back pain  Chronic left shoulder pain  Muscle weakness (generalized)  Rationale for Evaluation and Treatment: Rehabilitation  ONSET DATE: back the last few months  SUBJECTIVE:  SUBJECTIVE STATEMENT: "I had a colonoscopy on Friday so I was laying around most of the weekend so the back is a little more sore today. The shoulder is doing okay though."  PERTINENT HISTORY: Hx of pre-diabetes,   PAIN:  shoulder Are you having pain? Yes: NPRS scale: 3/10 currently, at worst 7/10 Pain location: unsure Pain description: uncomfortable.  Aggravating factors: reach acrossing, Laying on the L shoulder Relieving factors: stop using the shoulder,      back Are you having pain? Yes: NPRS scale: 6/10 currently Pain location: Middle low back  Pain description: Dull, stiff Aggravating factors: Prolonged sitting, Lifting Relieving factors: take some ibuprofen, heating pack   PRECAUTIONS: None  WEIGHT BEARING RESTRICTIONS: No  FALLS:  Has patient fallen in last 6 months? No  LIVING ENVIRONMENT: Lives with: lives with their family Lives in: House/apartment Stairs: No Has following equipment at home: None  OCCUPATION: Release of information specialistist.   PLOF: Independent  PATIENT GOALS: How to handle this at home and decease the pain    OBJECTIVE:   DIAGNOSTIC FINDINGS:  X-ray L shoulder  4/12 FINDINGS: There is no evidence of fracture or dislocation. There is no evidence of arthropathy or other focal bone abnormality. Soft tissues are unremarkable.   IMPRESSION: Negative.   PATIENT SURVEYS :  FOTO Back 53% and predicted 63%  Shoulder 59% and predicted 64% COGNITION: Overall cognitive status: Within functional limits for tasks assessed     SENSATION: Light touch: WFL and Except for LUE at C3 ( diminished sensation)  POSTURE: Forward head posture, anteriorly rotated shoulders.   UPPER EXTREMITY ROM:   Active ROM Right eval Left eval Left 07/25/2022  Shoulder flexion [facility]   Shoulder extension     Shoulder abduction WFL 72 P! 92!  Shoulder adduction     Shoulder internal rotation WFL in neutral) WFL (in netural)   Shoulder external rotation     Elbow flexion     Elbow extension     Wrist flexion     Wrist extension     Wrist ulnar deviation     Wrist radial deviation     Wrist pronation     Wrist supination     (Blank rows = not tested)  UPPER EXTREMITY MMT:  MMT Right eval Left eval  Shoulder flexion 4 4-  Shoulder extension 4 4-  Shoulder abduction 4 3  Shoulder adduction    Shoulder internal rotation 4 3+  Shoulder external rotation 4 3+  Middle trapezius    Lower trapezius    Elbow flexion    Elbow extension    Wrist flexion    Wrist extension    Wrist ulnar deviation    Wrist radial deviation    Wrist pronation    Wrist supination    Grip strength (lbs)    (Blank rows = not tested)   LUMBAR ROM:   Active  A/PROM  06/26/2022  Flexion 62  Extension 18  Right lateral flexion 12  Left lateral flexion 12  Right rotation   Left rotation    (Blank rows = not tested)  LE ROM:  Active  Right 06/26/2022 Left 06/26/2022  Hip flexion    Hip extension    Hip abduction    Hip adduction    Hip internal rotation    Hip external rotation    Knee flexion    Knee extension    Ankle dorsiflexion    Ankle plantarflexion     Ankle inversion  Ankle eversion     (Blank rows = not tested)  LE MMT:  MMT Right 06/26/2022 Left 06/26/2022  Hip flexion 4 4-  Hip extension 4 4  Hip abduction 4+ 4+  Hip adduction 4+ 4+  Hip internal rotation    Hip external rotation    Knee flexion    Knee extension    Ankle dorsiflexion    Ankle plantarflexion    Ankle inversion    Ankle eversion     (Blank rows = not tested)   GAIT: Distance walked: 150ft (to tx area) Assistive device utilized: None Level of assistance: Complete Independence Comments: unremarkable.  SHOULDER SPECIAL TESTS: Withheld testing at this time due     PALPATION:  TTP along the R lumbar paraspinals at L1-L5, Tenderness noted at the L1-L2 segment  TTP for L shoulder along the infraspinatus and teres minor, and upper trap with mulitple trigger points noted   TODAY'S TREATMENT:                                                                                                                                         [facility] Adult PT Treatment:                                                DATE: 08/07/22 Therapeutic Exercise: Nu-step L7 x 7 min UE/LE Childs pose 1 x 30 sec, walking hands to the L/R 2 ea. 30 sec LTR 1 x 20  Sidelying hip abduction 2 x 12 bil Dead bug 1 x 5 holding 10 sec while maintaining posterior pelvic tilt (tactile cues for proper form) Sit to stand with 5 sec eccentric lowering 2 x 10   Updated HEP for sidelying hip abduction , sit to stand  Manual Therapy: MTPR along the R QL x 2   [facility] Adult PT Treatment:                                                DATE: 08/01/2022 Therapeutic Exercise: UBE L5 x 4 min (FWD/BWD) Standing rows 2 x 15 with GTB Standing shoulder extension 2 x 15 with GTB Standing shoulder scaption 2 x 20 with 1# Ellipitical L1 x ramp L1 x 4 min  R sidelying L shoulder ER into flexion into horixzontal abduction 1 x 2 min no weight, then 1 x 2 min with 1# Seated shoulder overhead press 2 x 10 -  verbal cues to keep hands in peripheral vision.   Manual Therapy: MTPR L lateral infraspinatus   [facility] Adult PT Treatment:  DATE: 07/25/2022 Therapeutic Exercise: UBE L4 x 5 min (FWD/BWD x 5 min )  EMOM x 3 sets L ER in  R sidelying x 20 L abduction in R sidelying x 20 L Rows with RTB x 20 L Seated scaption x 20 L shoulder IR x 20 RTB  Upper trap stretch 2 x 30 sec on L Manual Therapy: MTPR L infraspinatus/ supraspinatus/ upper trap and middle deltoid    PATIENT EDUCATION: Education details: evaluation findings, POC, goals, HEP with proper form Person educated: Patient Education method: Explanation, Verbal cues, and Handouts Education comprehension: verbalized understanding  HOME EXERCISE PROGRAM: Access Code: [email or web address] URL: [email or web address] Date: 08/07/2022 Prepared by: Lulu Riding  Exercises - Standing Shoulder Row with Anchored Resistance  - 1 x daily - 7 x weekly - 2 sets - 10 reps - Shoulder Internal Rotation  - 1 x daily - 7 x weekly - 2 sets - 10 reps - Shoulder External Rotation  - 1 x daily - 7 x weekly - 2 sets - 10 reps - Lower Trunk Rotation  - 1 x daily - 7 x weekly - 2 sets - 10 reps - Seated Flexion Stretch with Swiss Ball  - 3 x daily - 7 x weekly - 1-2 sets - 2-3 reps - 30-60 sec hold - Supine Shoulder Horizontal Abduction with Resistance  - 1 x daily - 7 x weekly - 1 sets - 10 reps - Sidelying Shoulder Abduction Palm Forward  - 1 x daily - 7 x weekly - 1-2 sets - 10 reps - Seated Flexion Stretch  - 3 x daily - 7 x weekly - 1-2 sets - 2-3 reps - 30-60 hold - Seated Hamstring Stretch  - 1 x daily - 7 x weekly - 2 sets - 2 reps - 30 hold - SLR  - 1 x daily - 7 x weekly - 2 sets - 10-15 reps - 1 hold - Sidelying Hip Abduction  - 1 x daily - 7 x weekly - 3 sets - 12-15 reps - Sit to Stand Without Arm Support  - 1 x daily - 7 x weekly - 2 sets - 12-15 reps  ASSESSMENT:  CLINICAL  IMPRESSION: Valerie Fletcher continues to make great progress with physical therapy with both her shoulder and back. She reported increased stiffness / soreness in her back today which could be related to limited mobility over the weekend. Focused session on back and hip strengthening. She did very well with exercises today noting improvement of her back soreness following session. She reports her shoulder continues to progress nicely, updated her theraband resistance today to green.    Evaluation: Patient is a 52 y.o. F who was seen today for physical therapy evaluation and treatment for dx of L shoulder instability and low back pain. She has functional shoulder ROM with the exception of abduction secondary to pain, LUE presents with gross weakness compared bil. Low back she demonstrates TTP along the L1-L2 spinous process and R SIJ. Performed a test / treat/ re-test and she responded favorably with hamstring stretching and R hip flexor MET via SLR. She noed improvement of pain and ease of trunk mobility after suggesting potentiall SIJ involvement. She would benefit from physical therapy to promote L shoulder strengthening / stability, improve pain free ROM, increase trunk mobility, reduce pain and improve postural awareness while maximizing her function by addressing the deficits listed.    OBJECTIVE IMPAIRMENTS: decreased activity tolerance, decreased endurance, decreased ROM, decreased strength, increased muscle spasms, improper  body mechanics, postural dysfunction, and pain.   ACTIVITY LIMITATIONS: carrying, lifting, bending, sitting, standing, squatting, and reach over head  PARTICIPATION LIMITATIONS: cleaning, laundry, shopping, community activity, and occupation  PERSONAL FACTORS: Age, Past/current experiences, Time since onset of injury/illness/exacerbation, and 1-2 comorbidities: hx of dislocations and pre-diabetes  are also affecting patient's functional outcome.   REHAB POTENTIAL: Good  CLINICAL  DECISION MAKING: Evolving/moderate complexity  EVALUATION COMPLEXITY: Moderate  GOALS: Goals reviewed with patient? Yes  SHORT TERM GOALS: Target date: 07/24/2022   PT to be IND with initial HEP for therapeutic progression Baseline: No previous HEP Goal status: MET 07/25/2022  2.  Pt to verbalize/ demo efficient posture and lifting mechanics to prevent and reduce L shoulder and low back pain Baseline: no knowledge of posture Goal status: MET 07/25/2022  3.  Pt to report low back pain reducing to </= 5/10 at worst to demo improving function Baseline: worst 10/10 pain Status: 6/10 or the back  Goal status: ongoing 07/25/2022  4.  Increase L shoulder abduction by >/= 10 degrees to demo improving shoulder stability with </= 5/10 pain  Baseline: see flow sheet Goal status: MET 07/25/2022   LONG TERM GOALS: Target date: 08/21/2022   Increase L shoulder abduction and all other movements to [facility] compared bil with </= 2/10 pain and no report of feeling unstable Baseline: see goals Goal status: INITIAL  2.  Pt to be able to to sit, stand for >/= 60 min with </=2/10  report of back pain to assist with endurance required for work  Baseline: reports pain with work  Goal status: INITIAL  3.  Increase LUE gross strength to >/= 4/5 to promote stability and assist with functional strength required for ADLs Baseline:  Goal status: INITIAL  4.  Increase FOTO score for both back and shoulder to >/= 63% to demo improvement in function Baseline:  Goal status: INITIAL  5.  Pt to report no back spasm for >/= 3 weeks to demonstrate improvement in posture and QOL.  Baseline:  Goal status: INITIAL  6.  Pt to be IND with all HEP and will be able to maintain and progress their current LOF IND. Baseline:  Goal status: INITIAL  PLAN: PT FREQUENCY: 1-2x/week  PT DURATION: 8 weeks  PLANNED INTERVENTIONS: Therapeutic exercises, Therapeutic activity, Neuromuscular re-education, Balance training,  Gait training, Patient/Family education, Self Care, Joint mobilization, Dry Needling, Cryotherapy, Moist heat, Taping, Manual therapy, and Re-evaluation  PLAN FOR NEXT SESSION: Review/ update HEP PRN. STW along lumbar paraspainsl, potential for SIJ involvement on the R. L scapular strengthening, overhead strengthening/ activities   Valerie Fletcher PT, DPT, LAT, ATC  08/07/22  10:19 AM

## 2022-08-10 ENCOUNTER — Encounter: Payer: Self-pay | Admitting: Gastroenterology

## 2022-08-16 ENCOUNTER — Ambulatory Visit: Payer: BC Managed Care – PPO | Admitting: Physical Therapy

## 2022-08-21 ENCOUNTER — Ambulatory Visit: Payer: BC Managed Care – PPO | Admitting: Physical Therapy

## 2022-09-04 DIAGNOSIS — E785 Hyperlipidemia, unspecified: Secondary | ICD-10-CM | POA: Diagnosis not present

## 2022-09-04 DIAGNOSIS — R82998 Other abnormal findings in urine: Secondary | ICD-10-CM | POA: Diagnosis not present

## 2022-09-12 ENCOUNTER — Other Ambulatory Visit: Payer: Self-pay | Admitting: Internal Medicine

## 2022-09-12 DIAGNOSIS — R109 Unspecified abdominal pain: Secondary | ICD-10-CM

## 2022-09-20 ENCOUNTER — Other Ambulatory Visit: Payer: Self-pay | Admitting: Internal Medicine

## 2022-09-20 ENCOUNTER — Ambulatory Visit
Admission: RE | Admit: 2022-09-20 | Discharge: 2022-09-20 | Disposition: A | Discharge status: Home / Self Care | Payer: BC Managed Care – PPO | Source: Ambulatory Visit | Attending: Internal Medicine | Admitting: Internal Medicine

## 2022-09-20 DIAGNOSIS — K8050 Calculus of bile duct without cholangitis or cholecystitis without obstruction: Secondary | ICD-10-CM | POA: Diagnosis not present

## 2022-09-20 DIAGNOSIS — R109 Unspecified abdominal pain: Secondary | ICD-10-CM

## 2022-10-12 DIAGNOSIS — Z9889 Other specified postprocedural states: Secondary | ICD-10-CM | POA: Diagnosis not present

## 2022-10-12 DIAGNOSIS — R7989 Other specified abnormal findings of blood chemistry: Secondary | ICD-10-CM | POA: Diagnosis not present

## 2022-10-12 DIAGNOSIS — K8020 Calculus of gallbladder without cholecystitis without obstruction: Secondary | ICD-10-CM | POA: Diagnosis not present

## 2022-10-12 DIAGNOSIS — K838 Other specified diseases of biliary tract: Secondary | ICD-10-CM | POA: Diagnosis not present

## 2022-11-10 ENCOUNTER — Emergency Department (HOSPITAL_COMMUNITY): Payer: BC Managed Care – PPO

## 2022-11-10 ENCOUNTER — Emergency Department (HOSPITAL_COMMUNITY)
Admission: EM | Admit: 2022-11-10 | Discharge: 2022-11-10 | Disposition: A | Discharge status: Home / Self Care | Payer: BC Managed Care – PPO | Attending: Emergency Medicine | Admitting: Emergency Medicine

## 2022-11-10 ENCOUNTER — Encounter (HOSPITAL_COMMUNITY): Payer: Self-pay

## 2022-11-10 ENCOUNTER — Other Ambulatory Visit: Payer: Self-pay

## 2022-11-10 DIAGNOSIS — R1011 Right upper quadrant pain: Secondary | ICD-10-CM | POA: Diagnosis not present

## 2022-11-10 DIAGNOSIS — Z79899 Other long term (current) drug therapy: Secondary | ICD-10-CM | POA: Diagnosis not present

## 2022-11-10 DIAGNOSIS — K838 Other specified diseases of biliary tract: Secondary | ICD-10-CM | POA: Insufficient documentation

## 2022-11-10 DIAGNOSIS — I10 Essential (primary) hypertension: Secondary | ICD-10-CM | POA: Diagnosis not present

## 2022-11-10 DIAGNOSIS — K828 Other specified diseases of gallbladder: Secondary | ICD-10-CM | POA: Diagnosis not present

## 2022-11-10 LAB — URINALYSIS, ROUTINE W REFLEX MICROSCOPIC
Bilirubin Urine: NEGATIVE
Glucose, UA: NEGATIVE mg/dL
Hgb urine dipstick: NEGATIVE
Ketones, ur: NEGATIVE mg/dL
Leukocytes,Ua: NEGATIVE
Nitrite: NEGATIVE
Protein, ur: NEGATIVE mg/dL
Specific Gravity, Urine: 1.018 (ref 1.005–1.030)
pH: 5 (ref 5.0–8.0)

## 2022-11-10 LAB — COMPREHENSIVE METABOLIC PANEL
ALT: 57 U/L — ABNORMAL HIGH (ref 0–44)
AST: 48 U/L — ABNORMAL HIGH (ref 15–41)
Albumin: 4.2 g/dL (ref 3.5–5.0)
Alkaline Phosphatase: 112 U/L (ref 38–126)
Anion gap: 7 (ref 5–15)
BUN: 14 mg/dL (ref 6–20)
CO2: 24 mmol/L (ref 22–32)
Calcium: 9.3 mg/dL (ref 8.9–10.3)
Chloride: 106 mmol/L (ref 98–111)
Creatinine, Ser: 0.75 mg/dL (ref 0.44–1.00)
GFR, Estimated: >60 mL/min (ref >60)
Glucose, Bld: 114 mg/dL — ABNORMAL HIGH (ref 70–99)
Potassium: 4 mmol/L (ref 3.5–5.1)
Sodium: 137 mmol/L (ref 135–145)
Total Bilirubin: 0.7 mg/dL (ref 0.3–1.2)
Total Protein: 8.1 g/dL (ref 6.5–8.1)

## 2022-11-10 LAB — CBC
HCT: 36.2 % (ref 36.0–46.0)
Hemoglobin: 11.8 g/dL — ABNORMAL LOW (ref 12.0–15.0)
MCH: 28.7 pg (ref 26.0–34.0)
MCHC: 32.6 g/dL (ref 30.0–36.0)
MCV: 88.1 fL (ref 80.0–100.0)
Platelets: 217 10*3/uL (ref 150–400)
RBC: 4.11 MIL/uL (ref 3.87–5.11)
RDW: 14 % (ref 11.5–15.5)
WBC: 6.1 10*3/uL (ref 4.0–10.5)
nRBC: 0 % (ref 0.0–0.2)

## 2022-11-10 LAB — LIPASE, BLOOD: Lipase: 23 U/L (ref 11–51)

## 2022-11-10 MED ORDER — ONDANSETRON 4 MG PO TBDP: ORAL_TABLET | ORAL | 0 refills | Status: AC

## 2022-11-10 MED ORDER — ONDANSETRON 4 MG PO TBDP
4.0000 mg | ORAL_TABLET | Freq: Once | ORAL | Status: AC | PRN
  Administered 2022-11-10: 4 mg via ORAL
  Filled 2022-11-10: qty 1

## 2022-11-10 MED ORDER — SODIUM CHLORIDE 0.9 % IV BOLUS
1000.0000 mL | Freq: Once | INTRAVENOUS | Status: AC
  Administered 2022-11-10: 1000 mL via INTRAVENOUS

## 2022-11-10 MED ORDER — MORPHINE SULFATE (PF) 4 MG/ML IV SOLN
4.0000 mg | Freq: Once | INTRAVENOUS | Status: AC
  Administered 2022-11-10: 4 mg via INTRAVENOUS
  Filled 2022-11-10: qty 1

## 2022-11-10 MED ORDER — ONDANSETRON HCL 4 MG/2ML IJ SOLN
4.0000 mg | Freq: Once | INTRAMUSCULAR | Status: AC
  Administered 2022-11-10: 4 mg via INTRAVENOUS
  Filled 2022-11-10: qty 2

## 2022-11-10 MED ORDER — HYDROCODONE-ACETAMINOPHEN 5-325 MG PO TABS
1.0000 | ORAL_TABLET | Freq: Four times a day (QID) | ORAL | 0 refills | Status: DC | PRN
Start: 2022-11-10 — End: 2023-01-09

## 2022-11-10 NOTE — ED Triage Notes (Signed)
Pt here with right sided abd pain. Pt has hx of cholecystitis. With scheduled surgery oct 4th. Pain is severe today. Pain 7/10. Nauseated. LBM yesterday and soft.Denies urinary symptoms

## 2022-11-10 NOTE — Discharge Instructions (Signed)
You have biliary sludge.  I recommend you take hydrocodone for severe pain and take Motrin for mild pain.  Take Zofran for nausea  Stay hydrated.  Please follow-up with Dr. Andrey Campanile  Return to ER if you have worse abdominal pain or vomiting or fever

## 2022-11-10 NOTE — ED Provider Notes (Signed)
[facility]MENT AT [facility] Provider Note   CSN: [id number] Arrival date & time: 11/10/22  1228     History  Chief Complaint  Patient presents with   Abdominal Pain    Valerie Fletcher is a 52 y.o. female history of hypertension, biliary sludge here presenting with right upper quadrant abdominal pain.  Patient states that she has been having right upper quadrant abdominal pain for the last 3 to 4 days.  She states that 7 out of 10.  She states it is associated with some nausea.  Patient has been taking ibuprofen with no relief.  Patient was diagnosed with biliary sludge previously.  She is scheduled for cholecystectomy in the beginning of October with Dr. Andrey Campanile.   The history is provided by the patient.       Home Medications Prior to Admission medications   Medication Sig Start Date End Date Taking? Authorizing Provider  HYDROcodone-acetaminophen (NORCO/VICODIN) 5-325 MG tablet Take 1 tablet by mouth every 6 (six) hours as needed. 11/10/22  Yes Charlynne Pander, MD  ondansetron (ZOFRAN-ODT) 4 MG disintegrating tablet 4mg  ODT q4 hours prn nausea/vomit 11/10/22  Yes Charlynne Pander, MD  albuterol (PROVENTIL HFA;VENTOLIN HFA) 108 (90 Base) MCG/ACT inhaler Inhale 1 puff into the lungs every 4 (four) hours as needed for wheezing or shortness of breath.    [provider]  amLODipine (NORVASC) 10 MG tablet Take 1 tablet (10 mg total) by mouth daily. TAKE 1 TABLET BY MOUTH EVERYDAY AT BEDTIME FOR BLOOD PRESSURE 12/03/20 08/04/22  Evlyn Kanner, MD  carvedilol (COREG) 25 MG tablet Take 1 tablet (25 mg total) by mouth 2 (two) times daily with a meal. For high blood pressure 12/03/20 08/04/22  Evlyn Kanner, MD  cyclobenzaprine (FLEXERIL) 10 MG tablet Take 10 mg by mouth at bedtime as needed for muscle spasms.    [provider]  ezetimibe (ZETIA) 10 MG tablet Take 10 mg by mouth daily. 07/04/22   [provider]  ondansetron (ZOFRAN)  4 MG tablet Take 1 tablet (4 mg total) by mouth every 8 (eight) hours as needed for nausea or vomiting. Take as prep instructions advise 07/12/22   Sherrilyn Rist, MD  pantoprazole (PROTONIX) 40 MG tablet Take 1 tablet (40 mg total) by mouth daily. Take 1 tablet 30 mins before food daily for acid reflux 12/03/20 07/12/22  Evlyn Kanner, MD      Allergies    Prednisone, Compazine [prochlorperazine edisylate], Dilaudid [hydromorphone], Hydromorphone hcl, Oxycodone-acetaminophen, Prochlorperazine, and Statins    Review of Systems   Review of Systems  Gastrointestinal:  Positive for abdominal pain.  All other systems reviewed and are negative.   Physical Exam Updated Vital Signs BP (!) 144/91   Pulse 76   Temp 98.8 F (37.1 C) (Oral)   Resp 17   Ht 5\' 4"  (1.626 m)   Wt 79.4 kg   LMP 02/27/2021   SpO2 99%   BMI 30.04 kg/m  Physical Exam Vitals and nursing note reviewed.  HENT:     Head: Normocephalic.  Eyes:     Extraocular Movements: Extraocular movements intact.     Pupils: Pupils are equal, round, and reactive to light.  Cardiovascular:     Rate and Rhythm: Normal rate and regular rhythm.  Pulmonary:     Effort: Pulmonary effort is normal.     Breath sounds: Normal breath sounds.  Abdominal:     General: Abdomen is flat.  Comments: + Right upper quadrant tenderness.  Negative Murphy sign  Skin:    General: Skin is warm.     Capillary Refill: Capillary refill takes less than 2 seconds.  Neurological:     General: No focal deficit present.     Mental Status: She is alert and oriented to person, place, and time.  Psychiatric:        Mood and Affect: Mood normal.        Behavior: Behavior normal.     ED Results / Procedures / Treatments   Labs (all labs ordered are listed, but only abnormal results are displayed) Labs Reviewed  COMPREHENSIVE METABOLIC PANEL - Abnormal; Notable for the following components:      Result Value   Glucose, Bld 114 (*)    AST  48 (*)    ALT 57 (*)    All other components within normal limits  CBC - Abnormal; Notable for the following components:   Hemoglobin 11.8 (*)    All other components within normal limits  LIPASE, BLOOD  URINALYSIS, ROUTINE W REFLEX MICROSCOPIC  HCG, SERUM, QUALITATIVE    EKG None  Radiology US Abdomen Limited RUQ (LIVER/GB)  Result Date: 11/10/2022 CLINICAL DATA:  Right upper quadrant pain EXAM: ULTRASOUND ABDOMEN LIMITED RIGHT UPPER QUADRANT COMPARISON:  Ultrasound 09/20/2022 FINDINGS: Gallbladder: Small amount of sludge. Normal wall thickness. Negative sonographic Murphy Common bile duct: Diameter: 2.4 mm Liver: No focal lesion identified. Within normal limits in parenchymal echogenicity. Portal vein is patent on color Doppler imaging with normal direction of blood flow towards the liver. Other: None. IMPRESSION: Small amount of gallbladder sludge. Otherwise negative examination. Electronically Signed   By: Jasmine Pang M.D.   On: 11/10/2022 21:35    Procedures Procedures    Medications Ordered in ED Medications  ondansetron (ZOFRAN-ODT) disintegrating tablet 4 mg (4 mg Oral Given 11/10/22 1244)  ondansetron (ZOFRAN) injection 4 mg (4 mg Intravenous Given 11/10/22 1900)  morphine (PF) 4 MG/ML injection 4 mg (4 mg Intravenous Given 11/10/22 1900)  sodium chloride 0.9 % bolus 1,000 mL (0 mLs Intravenous Stopped 11/10/22 2014)  ondansetron (ZOFRAN) injection 4 mg (4 mg Intravenous Given 11/10/22 2124)    ED Course/ Medical Decision Making/ A&P                                 Medical Decision Making Valerie Fletcher is a 51 y.o. female here presenting with right upper quadrant pain.  Patient has a history of biliary sludge.  Likely symptomatic bili versus cholecystitis.  Plan to get CBC and CMP and right upper quadrant ultrasound.  Will give IV pain medicine and reassess.  9:59 PM I reviewed patient's labs and independently interpreted imaging study.  Patient's LFTs are normal.   Ultrasound showed sludge.  There is no evidence of cholecystitis.  Patient states that her pain is well-controlled now.  I discussed case with Dr. Maisie Fus from surgery.  She agreed with the plan and patient is scheduled for surgery in several weeks.  Gait strict return precautions  Problems Addressed: Biliary sludge: acute illness or injury  Amount and/or Complexity of Data Reviewed Labs: ordered. Decision-making details documented in ED Course. Radiology: ordered and independent interpretation performed. Decision-making details documented in ED Course.  Risk Prescription drug management.    Final Clinical Impression(s) / ED Diagnoses Final diagnoses:  None    Rx / DC Orders ED Discharge Orders  Ordered    HYDROcodone-acetaminophen (NORCO/VICODIN) 5-325 MG tablet  Every 6 hours PRN        11/10/22 2158    ondansetron (ZOFRAN-ODT) 4 MG disintegrating tablet        11/10/22 2158              Charlynne Pander, MD 11/10/22 2200

## 2022-11-23 NOTE — Pre-Procedure Instructions (Signed)
Surgical Instructions    Your procedure is scheduled on January 08, 2023.  Report to [facility] Main Entrance "A" at 5:30 A.M., then check in with the Admitting office.  Call this number if you have problems the morning of surgery:  [phone]  If you have any questions prior to your surgery date call [phone]: Open Monday-Friday 8am-4pm If you experience any cold or flu symptoms such as cough, fever, chills, shortness of breath, etc. between now and your scheduled surgery, please notify us at the above number.     Remember:  Do not eat after midnight the night before your surgery  You may drink clear liquids until 4:30 AM the morning of your surgery.   Clear liquids allowed are: Water, Non-Citrus Juices (without pulp), Carbonated Beverages, Clear Tea, Black Coffee Only (NO MILK, CREAM OR POWDERED CREAMER of any kind), and Gatorade.     Take these medicines the morning of surgery with A SIP OF WATER: amLODipine (NORVASC)  carvedilol (COREG)  ezetimibe (ZETIA)   May take these medicines IF NEEDED: ondansetron (ZOFRAN-ODT)  pantoprazole (PROTONIX)    As of today, STOP taking any Aspirin (unless otherwise instructed by your surgeon) Aleve, Naproxen, Ibuprofen, Motrin, Advil, Goody's, BC's, all herbal medications, fish oil, and all vitamins.                     Do NOT Smoke (Tobacco/Vaping) for 24 hours prior to your procedure.  If you use a CPAP at night, you may bring your mask/headgear for your overnight stay.   Contacts, glasses, piercing's, hearing aid's, dentures or partials may not be worn into surgery, please bring cases for these belongings.    For patients admitted to the hospital, discharge time will be determined by your treatment team.   Patients discharged the day of surgery will not be allowed to drive home, and someone needs to stay with them for 24 hours.  SURGICAL WAITING ROOM VISITATION Patients having surgery or a procedure may have no more than 2  support people in the waiting area - these visitors may rotate.   Children under the age of 22 must have an adult with them who is not the patient. If the patient needs to stay at the hospital during part of their recovery, the visitor guidelines for inpatient rooms apply. Pre-op nurse will coordinate an appropriate time for 1 support person to accompany patient in pre-op.  This support person may not rotate.   Please refer to the [facility] website for the visitor guidelines for Inpatients (after your surgery is over and you are in a regular room).    Special instructions:   [place]- Preparing For Surgery  Before surgery, you can play an important role. Because skin is not sterile, your skin needs to be as free of germs as possible. You can reduce the number of germs on your skin by washing with CHG (chlorahexidine gluconate) Soap before surgery.  CHG is an antiseptic cleaner which kills germs and bonds with the skin to continue killing germs even after washing.    Oral Hygiene is also important to reduce your risk of infection.  Remember - BRUSH YOUR TEETH THE MORNING OF SURGERY WITH YOUR REGULAR TOOTHPASTE  Please do not use if you have an allergy to CHG or antibacterial soaps. If your skin becomes reddened/irritated stop using the CHG.  Do not shave (including legs and underarms) for at least 48 hours prior to first CHG shower. It is OK to  shave your face.  Please follow these instructions carefully.   Shower the NIGHT BEFORE SURGERY and the MORNING OF SURGERY  If you chose to wash your hair, wash your hair first as usual with your normal shampoo.  After you shampoo, rinse your hair and body thoroughly to remove the shampoo.  Use CHG Soap as you would any other liquid soap. You can apply CHG directly to the skin and wash gently with a scrungie or a clean washcloth.   Apply the CHG Soap to your body ONLY FROM THE NECK DOWN.  Do not use on open wounds or open sores. Avoid contact  with your eyes, ears, mouth and genitals (private parts). Wash Face and genitals (private parts)  with your normal soap.   Wash thoroughly, paying special attention to the area where your surgery will be performed.  Thoroughly rinse your body with warm water from the neck down.  DO NOT shower/wash with your normal soap after using and rinsing off the CHG Soap.  Pat yourself dry with a CLEAN TOWEL.  Wear CLEAN PAJAMAS to bed the night before surgery  Place CLEAN SHEETS on your bed the night before your surgery  DO NOT SLEEP WITH PETS.   Day of Surgery: Take a shower with CHG soap. Do not wear jewelry or makeup Do not wear lotions, powders, perfumes/colognes, or deodorant. Do not shave 48 hours prior to surgery.  Men may shave face and neck. Do not bring valuables to the hospital.  [facility] is not responsible for any belongings or valuables. Do not wear nail polish, gel polish, artificial nails, or any other type of covering on natural nails (fingers and toes) If you have artificial nails or gel coating that need to be removed by a nail salon, please have this removed prior to surgery. Artificial nails or gel coating may interfere with anesthesia's ability to adequately monitor your vital signs. Wear Clean/Comfortable clothing the morning of surgery Remember to brush your teeth WITH YOUR REGULAR TOOTHPASTE.   Please read over the following fact sheets that you were given.    If you received a COVID test during your pre-op visit  it is requested that you wear a mask when out in public, stay away from anyone that may not be feeling well and notify your surgeon if you develop symptoms. If you have been in contact with anyone that has tested positive in the last 10 days please notify you surgeon.

## 2022-11-24 ENCOUNTER — Encounter (HOSPITAL_COMMUNITY)
Admission: RE | Admit: 2022-11-24 | Discharge: 2022-11-24 | Disposition: A | Discharge status: Home / Self Care | Payer: BC Managed Care – PPO | Source: Ambulatory Visit | Attending: General Surgery | Admitting: General Surgery

## 2022-11-24 NOTE — Progress Notes (Signed)
Pt arrived for PAT appointment. According to schedule, pt surgery moved to [facility] OR in November. Pt verbalized understanding and stated she will call Dr. Tawana Scale office regarding date change.

## 2022-11-28 DIAGNOSIS — I10 Essential (primary) hypertension: Secondary | ICD-10-CM | POA: Diagnosis not present

## 2022-12-04 ENCOUNTER — Ambulatory Visit: Payer: Self-pay | Admitting: General Surgery

## 2022-12-27 NOTE — Progress Notes (Addendum)
Anesthesia Review:  PCP: DR  Aniceto Boss  Cardiologist : none  Chest x-ray : EKG : 01/02/23  Echo : Stress test: Cardiac Cath :  Activity level: can do a flight of stairs without difficulty  Sleep Study/ CPAP : none  Fasting Blood Sugar :      / Checks Blood Sugar -- times a day:   Blood Thinner/ Instructions /Last Dose: ASA / Instructions/ Last Dose :

## 2023-01-01 NOTE — Patient Instructions (Signed)
SURGICAL WAITING ROOM VISITATION  Patients having surgery or a procedure may have no more than 2 support people in the waiting area - these visitors may rotate.    Children under the age of 15 must have an adult with them who is not the patient.  Due to an increase in RSV and influenza rates and associated hospitalizations, children ages 66 and under may not visit patients in [facility] hospitals.  If the patient needs to stay at the hospital during part of their recovery, the visitor guidelines for inpatient rooms apply. Pre-op nurse will coordinate an appropriate time for 1 support person to accompany patient in pre-op.  This support person may not rotate.    Please refer to the [facility] website for the visitor guidelines for Inpatients (after your surgery is over and you are in a regular room).       Your procedure is scheduled on:  01/09/2023    Report to [facility] Main Entrance    Report to admitting at  0515 AM   Call this number if you have problems the morning of surgery [phone]   Do not eat food :After Midnight.   After Midnight you may have the following liquids until __ 0430___ AM DAY OF SURGERY  Water Non-Citrus Juices (without pulp, NO RED-Apple, White grape, White cranberry) Black Coffee (NO MILK/CREAM OR CREAMERS, sugar ok)  Clear Tea (NO MILK/CREAM OR CREAMERS, sugar ok) regular and decaf                             Plain Jell-O (NO RED)                                           Fruit ices (not with fruit pulp, NO RED)                                     Popsicles (NO RED)                                                               Sports drinks like Gatorade (NO RED)                    The day of surgery:  Drink ONE (1) Pre-Surgery Clear Ensure or G2 at  0430AM  ( have completed by ) the morning of surgery. Drink in one sitting. Do not sip.  This drink was given to you during your hospital  pre-op appointment visit. Nothing else to  drink after completing the  Pre-Surgery Clear Ensure or G2.          If you have questions, please contact your surgeon's office.      Oral Hygiene is also important to reduce your risk of infection.                                    Remember - BRUSH YOUR TEETH THE MORNING OF SURGERY WITH YOUR REGULAR  TOOTHPASTE  DENTURES WILL BE REMOVED PRIOR TO SURGERY PLEASE DO NOT APPLY "Poly grip" OR ADHESIVES!!!   Do NOT smoke after Midnight   Stop all vitamins and herbal supplements 7 days before surgery.   Take these medicines the morning of surgery with A SIP OF WATER:  coreg, protonix if needed   DO NOT TAKE ANY ORAL DIABETIC MEDICATIONS DAY OF YOUR SURGERY  Bring CPAP mask and tubing day of surgery.                              You may not have any metal on your body including hair pins, jewelry, and body piercing             Do not wear make-up, lotions, powders, perfumes/cologne, or deodorant  Do not wear nail polish including gel and S&S, artificial/acrylic nails, or any other type of covering on natural nails including finger and toenails. If you have artificial nails, gel coating, etc. that needs to be removed by a nail salon please have this removed prior to surgery or surgery may need to be canceled/ delayed if the surgeon/ anesthesia feels like they are unable to be safely monitored.   Do not shave  48 hours prior to surgery.               Men may shave face and neck.   Do not bring valuables to the hospital. [facility] IS NOT             RESPONSIBLE   FOR VALUABLES.   Contacts, glasses, dentures or bridgework may not be worn into surgery.   Bring small overnight bag day of surgery.   DO NOT BRING YOUR HOME MEDICATIONS TO THE HOSPITAL. PHARMACY WILL DISPENSE MEDICATIONS LISTED ON YOUR MEDICATION LIST TO YOU DURING YOUR ADMISSION IN THE HOSPITAL!    Patients discharged on the day of surgery will not be allowed to drive home.  Someone NEEDS to stay with you for the first  24 hours after anesthesia.   Special Instructions: Bring a copy of your healthcare power of attorney and living will documents the day of surgery if you haven't scanned them before.              Please read over the following fact sheets you were given: IF YOU HAVE QUESTIONS ABOUT YOUR PRE-OP INSTRUCTIONS PLEASE CALL [phone]   If you received a COVID test during your pre-op visit  it is requested that you wear a mask when out in public, stay away from anyone that may not be feeling well and notify your surgeon if you develop symptoms. If you test positive for Covid or have been in contact with anyone that has tested positive in the last 10 days please notify you surgeon.    [place] - Preparing for Surgery Before surgery, you can play an important role.  Because skin is not sterile, your skin needs to be as free of germs as possible.  You can reduce the number of germs on your skin by washing with CHG (chlorahexidine gluconate) soap before surgery.  CHG is an antiseptic cleaner which kills germs and bonds with the skin to continue killing germs even after washing. Please DO NOT use if you have an allergy to CHG or antibacterial soaps.  If your skin becomes reddened/irritated stop using the CHG and inform your nurse when you arrive at Short Stay. Do not shave (including legs  and underarms) for at least 48 hours prior to the first CHG shower.  You may shave your face/neck. Please follow these instructions carefully:  1.  Shower with CHG Soap the night before surgery and the  morning of Surgery.  2.  If you choose to wash your hair, wash your hair first as usual with your  normal  shampoo.  3.  After you shampoo, rinse your hair and body thoroughly to remove the  shampoo.                           4.  Use CHG as you would any other liquid soap.  You can apply chg directly  to the skin and wash                       Gently with a scrungie or clean washcloth.  5.  Apply the CHG Soap to your body  ONLY FROM THE NECK DOWN.   Do not use on face/ open                           Wound or open sores. Avoid contact with eyes, ears mouth and genitals (private parts).                       Wash face,  Genitals (private parts) with your normal soap.             6.  Wash thoroughly, paying special attention to the area where your surgery  will be performed.  7.  Thoroughly rinse your body with warm water from the neck down.  8.  DO NOT shower/wash with your normal soap after using and rinsing off  the CHG Soap.                9.  Pat yourself dry with a clean towel.            10.  Wear clean pajamas.            11.  Place clean sheets on your bed the night of your first shower and do not  sleep with pets. Day of Surgery : Do not apply any lotions/deodorants the morning of surgery.  Please wear clean clothes to the hospital/surgery center.  FAILURE TO FOLLOW THESE INSTRUCTIONS MAY RESULT IN THE CANCELLATION OF YOUR SURGERY PATIENT SIGNATURE_________________________________  NURSE SIGNATURE__________________________________  ________________________________________________________________________

## 2023-01-02 ENCOUNTER — Other Ambulatory Visit: Payer: Self-pay

## 2023-01-02 ENCOUNTER — Encounter (HOSPITAL_COMMUNITY): Payer: Self-pay

## 2023-01-02 ENCOUNTER — Encounter (HOSPITAL_COMMUNITY)
Admission: RE | Admit: 2023-01-02 | Discharge: 2023-01-02 | Disposition: A | Discharge status: Home / Self Care | Payer: BC Managed Care – PPO | Source: Ambulatory Visit | Attending: General Surgery | Admitting: General Surgery

## 2023-01-02 VITALS — BP 135/93 | HR 78 | Temp 98.0°F | Resp 16 | Ht 64.0 in | Wt 174.0 lb

## 2023-01-02 DIAGNOSIS — Z01818 Encounter for other preprocedural examination: Secondary | ICD-10-CM | POA: Insufficient documentation

## 2023-01-02 LAB — BASIC METABOLIC PANEL
Anion gap: 9 (ref 5–15)
BUN: 15 mg/dL (ref 6–20)
CO2: 24 mmol/L (ref 22–32)
Calcium: 9.3 mg/dL (ref 8.9–10.3)
Chloride: 106 mmol/L (ref 98–111)
Creatinine, Ser: 0.75 mg/dL (ref 0.44–1.00)
GFR, Estimated: >60 mL/min (ref >60)
Glucose, Bld: 137 mg/dL — ABNORMAL HIGH (ref 70–99)
Potassium: 4.2 mmol/L (ref 3.5–5.1)
Sodium: 139 mmol/L (ref 135–145)

## 2023-01-02 LAB — CBC
HCT: 35.8 % — ABNORMAL LOW (ref 36.0–46.0)
Hemoglobin: 11.8 g/dL — ABNORMAL LOW (ref 12.0–15.0)
MCH: 29.4 pg (ref 26.0–34.0)
MCHC: 33 g/dL (ref 30.0–36.0)
MCV: 89.1 fL (ref 80.0–100.0)
Platelets: 222 10*3/uL (ref 150–400)
RBC: 4.02 MIL/uL (ref 3.87–5.11)
RDW: 14 % (ref 11.5–15.5)
WBC: 5.9 10*3/uL (ref 4.0–10.5)
nRBC: 0 % (ref 0.0–0.2)

## 2023-01-08 NOTE — H&P (Signed)
CC: here for surgery  Requesting provider: n/a  HPI: Valerie Fletcher is an 52 y.o. female who is here for lap chole with ioc and icg dye. Initially seen in clinic in August. Had another attack in September. O/w no changes.   Old hpi: She reports sort of chronic GI issues. She attributed her right upper quadrant pain initially to longstanding issues that she has with reflux. She describes intermittent episodes where she will have right upper quadrant pain that can be quite severe at time that radiates to her back. Associate with nausea and loading. Episodes do not last that long. She may have 2-3 episodes per month. She had an ultrasound by her PCP which showed cholelithiasis as well as a prominent common bile duct. Reportedly she also had some elevated LFTs. SHe is on cholesterol medication  She reports a remote history of hiatal hernia repair in around 02-03-98. She also reports to epigastric hernia repairs the most recent with mesh. That 1 was around 02-03-05 in [place].   Past Medical History:  Diagnosis Date   Asthma    uses inhaler   Constipation    Fatigue 02/14/2017   GERD (gastroesophageal reflux disease)    takes OTC meds as well as daily;   Hyperlipidemia    on meds   Hypertension    on meds   Seasonal allergies    Vaginal candidiasis 10/05/2017   Vertigo 11/20/2018   Viral upper respiratory tract infection 05/01/2018    Past Surgical History:  Procedure Laterality Date   BREAST BIOPSY     right   DILATION AND CURETTAGE OF UTERUS     HERNIA REPAIR     Umbilical hernia x2, Hiatial hernia   TUBAL LIGATION     UPPER GASTROINTESTINAL ENDOSCOPY      Family History  Problem Relation Age of Onset   Hypertension Mother    Hypertension Father    Lung cancer Father    Colon polyps Brother    Hypertension Brother    Kidney cancer Paternal Aunt    Bladder Cancer Maternal Grandmother 30       Passed away from disease 02-03-2006   Diabetes Maternal Grandmother    Esophageal  cancer Maternal Grandfather 02-04-1975   Prostate cancer Maternal Grandfather    Pancreatic cancer Paternal Grandmother    Diabetes Paternal Grandmother    Lung cancer Paternal Grandfather    Inflammatory bowel disease Nephew        crohns colitis   Colon cancer Neg Hx    Stomach cancer Neg Hx    Rectal cancer Neg Hx     Social:  reports that she has never smoked. She has never used smokeless tobacco. She reports current alcohol use. She reports that she does not use drugs.  Allergies:  Allergies  Allergen Reactions   Prednisone Anaphylaxis   Compazine [Prochlorperazine Edisylate] Other (See Comments)    "locks muscles"   Dilaudid [Hydromorphone] Itching   Oxycodone-Acetaminophen Hives and Itching   Statins     myalgia, tired, myalgia, tired   Tape     Leaves welts on skin. Patient prefers paper tape     Medications: I have reviewed the patient's current medications.   ROS - all of the below systems have been reviewed with the patient and positives are indicated with bold text General: chills, fever or night sweats Eyes: blurry vision or double vision ENT: epistaxis or sore throat Allergy/Immunology: itchy/watery eyes or nasal congestion Hematologic/Lymphatic: bleeding problems, blood clots or  swollen lymph nodes Endocrine: temperature intolerance or unexpected weight changes Breast: new or changing breast lumps or nipple discharge Resp: cough, shortness of breath, or wheezing CV: chest pain or dyspnea on exertion GI: as per HPI GU: dysuria, trouble voiding, or hematuria MSK: joint pain or joint stiffness Neuro: TIA or stroke symptoms Derm: pruritus and skin lesion changes Psych: anxiety and depression  PE Blood pressure 120/84, pulse 86, temperature 97.7 F (36.5 C), temperature source Oral, resp. rate 18, height 5\' 4"  (1.626 m), weight 78.9 kg, SpO2 97%. Constitutional: NAD; conversant; no deformities Eyes: Moist conjunctiva; no lid lag; anicteric; PERRL Neck: Trachea  midline; no thyromegaly Lungs: Normal respiratory effort; no tactile fremitus CV: RRR; no palpable thrills; no pitting edema GI: Abd soft, old trocar scars; no palpable hepatosplenomegaly MSK: Normal gait; no clubbing/cyanosis Psychiatric: Appropriate affect; alert and oriented x3 Lymphatic: No palpable cervical or axillary lymphadenopathy Skin:  Results for orders placed or performed during the hospital encounter of 01/09/23 (from the past 48 hour(s))  Pregnancy, urine POC     Status: None   Collection Time: 01/09/23  6:29 AM  Result Value Ref Range   Preg Test, Ur NEGATIVE NEGATIVE    Comment:        THE SENSITIVITY OF THIS METHODOLOGY IS >24 mIU/mL     No results found.  Imaging: Ruq u/s 09/20/22  FINDINGS: Gallbladder:  Small amount of sludge in the gallbladder lumen. No gallbladder wall thickening or pericholecystic fluid. Negative sonographic Murphy's sign.  Common bile duct:  Diameter: 7.4 mm, prominent.  Liver:  Mildly increased echogenicity. No focal lesion. Portal vein is patent on color Doppler imaging with normal direction of blood flow towards the liver.  Other: None.  IMPRESSION: 1. Small amount of sludge in the gallbladder lumen. No secondary signs to suggest acute cholecystitis. 2. Common bile duct is prominent measuring 7.4 mm. Recommend correlation with LFTs. If there is concern for biliary obstruction, recommend further evaluation with MRCP or ERCP. 3. Mildly increased hepatic parenchymal echogenicity suggestive of steatosis.  Pcp referral note July 2024  [facility] showed me her labs on her electronic portal. In July her T. bili was 0.4, alkaline phosphatase was elevated at 177, AST elevated at 83, ALT 123. She also showed me labs from April. Total bilirubin was 0.5, alkaline phosphatase 128, AST 23, ALT 43      Latest Ref Rng & Units 01/02/2023    8:10 AM 11/10/2022    1:10 PM 11/24/2020    4:11 PM  CMP  Glucose 70 - 99 mg/dL 782  956  213    BUN 6 - 20 mg/dL 15  14  21    Creatinine 0.44 - 1.00 mg/dL 0.86  5.78  4.69   Sodium 135 - 145 mmol/L 139  137  137   Potassium 3.5 - 5.1 mmol/L 4.2  4.0  4.2   Chloride 98 - 111 mmol/L 106  106  99   CO2 22 - 32 mmol/L 24  24  21    Calcium 8.9 - 10.3 mg/dL 9.3  9.3  62.9   Total Protein 6.5 - 8.1 g/dL  8.1    Total Bilirubin 0.3 - 1.2 mg/dL  0.7    Alkaline Phos 38 - 126 U/L  112    AST 15 - 41 U/L  48    ALT 0 - 44 U/L  57          Latest Ref Rng & Units 11/10/2022    1:10 PM  Hepatic Function  Total Protein 6.5 - 8.1 g/dL 8.1   Albumin 3.5 - 5.0 g/dL 4.2   AST 15 - 41 U/L 48   ALT 0 - 44 U/L 57   Alk Phosphatase 38 - 126 U/L 112   Total Bilirubin 0.3 - 1.2 mg/dL 0.7     A/P: Valerie Fletcher is an 52 y.o. female with  Symptomatic cholelithiasis  History of ventral hernia repair  Elevated LFTs  Common bile duct dilatation   To operating room for laparoscopic cholecystectomy with intraoperative cholangiogram, ICG dye IV antibiotic All questions asked and answered   Mary Sella. Andrey Campanile, MD, FACS General, Bariatric, & Minimally Invasive Surgery [facility]

## 2023-01-09 ENCOUNTER — Other Ambulatory Visit: Payer: Self-pay

## 2023-01-09 ENCOUNTER — Ambulatory Visit (HOSPITAL_COMMUNITY): Payer: BC Managed Care – PPO | Admitting: Physician Assistant

## 2023-01-09 ENCOUNTER — Encounter (HOSPITAL_COMMUNITY): Payer: Self-pay | Admitting: General Surgery

## 2023-01-09 ENCOUNTER — Ambulatory Visit (HOSPITAL_COMMUNITY)
Admission: RE | Admit: 2023-01-09 | Discharge: 2023-01-09 | Disposition: A | Discharge status: Home / Self Care | Payer: BC Managed Care – PPO | Attending: General Surgery | Admitting: General Surgery

## 2023-01-09 ENCOUNTER — Encounter (HOSPITAL_COMMUNITY)
Admission: RE | Disposition: A | Discharge status: Home / Self Care | Payer: Self-pay | Source: Home / Self Care | Attending: General Surgery

## 2023-01-09 ENCOUNTER — Ambulatory Visit (HOSPITAL_COMMUNITY): Payer: BC Managed Care – PPO

## 2023-01-09 ENCOUNTER — Ambulatory Visit (HOSPITAL_COMMUNITY): Payer: BC Managed Care – PPO | Admitting: Anesthesiology

## 2023-01-09 DIAGNOSIS — I10 Essential (primary) hypertension: Secondary | ICD-10-CM | POA: Diagnosis not present

## 2023-01-09 DIAGNOSIS — Z79899 Other long term (current) drug therapy: Secondary | ICD-10-CM | POA: Insufficient documentation

## 2023-01-09 DIAGNOSIS — K8010 Calculus of gallbladder with chronic cholecystitis without obstruction: Secondary | ICD-10-CM | POA: Diagnosis not present

## 2023-01-09 DIAGNOSIS — Z9889 Other specified postprocedural states: Secondary | ICD-10-CM | POA: Insufficient documentation

## 2023-01-09 DIAGNOSIS — K219 Gastro-esophageal reflux disease without esophagitis: Secondary | ICD-10-CM | POA: Insufficient documentation

## 2023-01-09 DIAGNOSIS — Z01818 Encounter for other preprocedural examination: Secondary | ICD-10-CM

## 2023-01-09 DIAGNOSIS — K8020 Calculus of gallbladder without cholecystitis without obstruction: Secondary | ICD-10-CM | POA: Diagnosis not present

## 2023-01-09 DIAGNOSIS — K8064 Calculus of gallbladder and bile duct with chronic cholecystitis without obstruction: Secondary | ICD-10-CM | POA: Diagnosis not present

## 2023-01-09 DIAGNOSIS — R932 Abnormal findings on diagnostic imaging of liver and biliary tract: Secondary | ICD-10-CM | POA: Diagnosis not present

## 2023-01-09 HISTORY — PX: CHOLECYSTECTOMY: SHX55

## 2023-01-09 LAB — POCT PREGNANCY, URINE: Preg Test, Ur: NEGATIVE

## 2023-01-09 SURGERY — LAPAROSCOPIC CHOLECYSTECTOMY WITH INTRAOPERATIVE CHOLANGIOGRAM
Anesthesia: General

## 2023-01-09 MED ORDER — PROPOFOL 10 MG/ML IV BOLUS
INTRAVENOUS | Status: DC | PRN
  Administered 2023-01-09: 170 mg via INTRAVENOUS

## 2023-01-09 MED ORDER — BUPIVACAINE HCL (PF) 0.25 % IJ SOLN
INTRAMUSCULAR | Status: DC | PRN
  Administered 2023-01-09: 30 mL

## 2023-01-09 MED ORDER — DEXAMETHASONE SODIUM PHOSPHATE 10 MG/ML IJ SOLN
INTRAMUSCULAR | Status: AC
  Filled 2023-01-09: qty 1

## 2023-01-09 MED ORDER — AMISULPRIDE (ANTIEMETIC) 5 MG/2ML IV SOLN
INTRAVENOUS | Status: AC
  Filled 2023-01-09: qty 4

## 2023-01-09 MED ORDER — SODIUM CHLORIDE 0.9 % IV SOLN
INTRAVENOUS | Status: DC | PRN
  Administered 2023-01-09: 12 mL

## 2023-01-09 MED ORDER — LIDOCAINE HCL (PF) 2 % IJ SOLN
INTRAMUSCULAR | Status: AC
  Filled 2023-01-09: qty 5

## 2023-01-09 MED ORDER — LACTATED RINGERS IV SOLN: INTRAVENOUS | Status: DC | PRN

## 2023-01-09 MED ORDER — MIDAZOLAM HCL 2 MG/2ML IJ SOLN
INTRAMUSCULAR | Status: DC | PRN
  Administered 2023-01-09: 2 mg via INTRAVENOUS

## 2023-01-09 MED ORDER — MEPERIDINE HCL 50 MG/ML IJ SOLN: 6.2500 mg | INTRAMUSCULAR | Status: DC | PRN

## 2023-01-09 MED ORDER — ACETAMINOPHEN 500 MG PO TABS
1000.0000 mg | ORAL_TABLET | ORAL | Status: AC
  Administered 2023-01-09: 1000 mg via ORAL
  Filled 2023-01-09: qty 2

## 2023-01-09 MED ORDER — CHLORHEXIDINE GLUCONATE CLOTH 2 % EX PADS
6.0000 | MEDICATED_PAD | Freq: Once | CUTANEOUS | Status: DC
Start: 2023-01-09 — End: 2023-01-09

## 2023-01-09 MED ORDER — SUGAMMADEX SODIUM 200 MG/2ML IV SOLN
INTRAVENOUS | Status: DC | PRN
  Administered 2023-01-09: 200 mg via INTRAVENOUS

## 2023-01-09 MED ORDER — ROCURONIUM BROMIDE 10 MG/ML (PF) SYRINGE
PREFILLED_SYRINGE | INTRAVENOUS | Status: AC
  Filled 2023-01-09: qty 10

## 2023-01-09 MED ORDER — ONDANSETRON HCL 4 MG/2ML IJ SOLN
INTRAMUSCULAR | Status: AC
  Filled 2023-01-09: qty 2

## 2023-01-09 MED ORDER — FENTANYL CITRATE PF 50 MCG/ML IJ SOSY
25.0000 ug | PREFILLED_SYRINGE | INTRAMUSCULAR | Status: DC | PRN
  Administered 2023-01-09: 25 ug via INTRAVENOUS

## 2023-01-09 MED ORDER — BUPIVACAINE HCL (PF) 0.25 % IJ SOLN
INTRAMUSCULAR | Status: AC
  Filled 2023-01-09: qty 30

## 2023-01-09 MED ORDER — LIDOCAINE 2% (20 MG/ML) 5 ML SYRINGE
INTRAMUSCULAR | Status: DC | PRN
  Administered 2023-01-09: 60 mg via INTRAVENOUS

## 2023-01-09 MED ORDER — PHENYLEPHRINE 80 MCG/ML (10ML) SYRINGE FOR IV PUSH (FOR BLOOD PRESSURE SUPPORT)
PREFILLED_SYRINGE | INTRAVENOUS | Status: DC | PRN
  Administered 2023-01-09: 160 ug via INTRAVENOUS
  Administered 2023-01-09: 120 ug via INTRAVENOUS

## 2023-01-09 MED ORDER — FENTANYL CITRATE (PF) 250 MCG/5ML IJ SOLN
INTRAMUSCULAR | Status: AC
  Filled 2023-01-09: qty 5

## 2023-01-09 MED ORDER — MIDAZOLAM HCL 2 MG/2ML IJ SOLN
INTRAMUSCULAR | Status: AC
Start: 2023-01-09 — End: ?
  Filled 2023-01-09: qty 2

## 2023-01-09 MED ORDER — FENTANYL CITRATE PF 50 MCG/ML IJ SOSY
PREFILLED_SYRINGE | INTRAMUSCULAR | Status: AC
  Administered 2023-01-09: 25 ug via INTRAVENOUS
  Filled 2023-01-09: qty 1

## 2023-01-09 MED ORDER — ACETAMINOPHEN 500 MG PO TABS: 1000.0000 mg | ORAL_TABLET | Freq: Three times a day (TID) | ORAL | Status: AC

## 2023-01-09 MED ORDER — ROCURONIUM BROMIDE 10 MG/ML (PF) SYRINGE
PREFILLED_SYRINGE | INTRAVENOUS | Status: DC | PRN
  Administered 2023-01-09: 70 mg via INTRAVENOUS

## 2023-01-09 MED ORDER — ORAL CARE MOUTH RINSE: 15.0000 mL | Freq: Once | OROMUCOSAL | Status: AC

## 2023-01-09 MED ORDER — AMISULPRIDE (ANTIEMETIC) 5 MG/2ML IV SOLN
10.0000 mg | Freq: Once | INTRAVENOUS | Status: AC | PRN
  Administered 2023-01-09: 10 mg via INTRAVENOUS

## 2023-01-09 MED ORDER — KETOROLAC TROMETHAMINE 30 MG/ML IJ SOLN
INTRAMUSCULAR | Status: DC | PRN
  Administered 2023-01-09: 15 mg via INTRAVENOUS

## 2023-01-09 MED ORDER — PROPOFOL 10 MG/ML IV BOLUS
INTRAVENOUS | Status: AC
  Filled 2023-01-09: qty 20

## 2023-01-09 MED ORDER — CHLORHEXIDINE GLUCONATE 0.12 % MT SOLN
15.0000 mL | Freq: Once | OROMUCOSAL | Status: AC
  Administered 2023-01-09: 15 mL via OROMUCOSAL

## 2023-01-09 MED ORDER — FENTANYL CITRATE (PF) 250 MCG/5ML IJ SOLN
INTRAMUSCULAR | Status: DC | PRN
  Administered 2023-01-09: 100 ug via INTRAVENOUS
  Administered 2023-01-09 (×2): 50 ug via INTRAVENOUS

## 2023-01-09 MED ORDER — 0.9 % SODIUM CHLORIDE (POUR BTL) OPTIME
TOPICAL | Status: DC | PRN
  Administered 2023-01-09: 1000 mL

## 2023-01-09 MED ORDER — TRAMADOL HCL 50 MG PO TABS: 50.0000 mg | ORAL_TABLET | Freq: Four times a day (QID) | ORAL | 0 refills | Status: AC | PRN

## 2023-01-09 MED ORDER — LACTATED RINGERS IR SOLN
Status: DC | PRN
  Administered 2023-01-09: 1000 mL

## 2023-01-09 MED ORDER — CEFOTETAN DISODIUM 2 G IJ SOLR
2.0000 g | INTRAMUSCULAR | Status: AC
  Administered 2023-01-09: 2 g via INTRAVENOUS
  Filled 2023-01-09: qty 2

## 2023-01-09 MED ORDER — SPY AGENT GREEN - (INDOCYANINE FOR INJECTION)
1.2500 mg | Freq: Once | INTRAMUSCULAR | Status: AC
  Administered 2023-01-09: 1.25 mg via INTRAVENOUS
  Filled 2023-01-09: qty 10

## 2023-01-09 MED ORDER — KETOROLAC TROMETHAMINE 30 MG/ML IJ SOLN
INTRAMUSCULAR | Status: AC
  Filled 2023-01-09: qty 1

## 2023-01-09 MED ORDER — ONDANSETRON HCL 4 MG/2ML IJ SOLN
INTRAMUSCULAR | Status: DC | PRN
  Administered 2023-01-09: 4 mg via INTRAVENOUS

## 2023-01-09 MED ORDER — DEXAMETHASONE SODIUM PHOSPHATE 10 MG/ML IJ SOLN
INTRAMUSCULAR | Status: DC | PRN
  Administered 2023-01-09: 6 mg via INTRAVENOUS

## 2023-01-09 SURGICAL SUPPLY — 56 items
ADH SKN CLS APL DERMABOND .7 (GAUZE/BANDAGES/DRESSINGS)
APL PRP STRL LF DISP 70% ISPRP (MISCELLANEOUS) ×1
APL SRG 38 LTWT LNG FL B (MISCELLANEOUS)
APPLICATOR ARISTA FLEXITIP XL (MISCELLANEOUS) IMPLANT
APPLIER CLIP 5 13 M/L LIGAMAX5 (MISCELLANEOUS) ×1
APPLIER CLIP ROT 10 11.4 M/L (STAPLE)
APR CLP MED LRG 11.4X10 (STAPLE)
APR CLP MED LRG 5 ANG JAW (MISCELLANEOUS) ×1
BAG COUNTER SPONGE SURGICOUNT (BAG) IMPLANT
BAG SPEC RTRVL 10 TROC 200 (ENDOMECHANICALS) ×1
BAG SPNG CNTER NS LX DISP (BAG)
CABLE HIGH FREQUENCY MONO STRZ (ELECTRODE) ×1 IMPLANT
CHLORAPREP W/TINT 26 (MISCELLANEOUS) ×1 IMPLANT
CLIP APPLIE 5 13 M/L LIGAMAX5 (MISCELLANEOUS) IMPLANT
CLIP APPLIE ROT 10 11.4 M/L (STAPLE) IMPLANT
CLIP LIGATING HEMO O LOK GREEN (MISCELLANEOUS) IMPLANT
COVER MAYO STAND XLG (MISCELLANEOUS) IMPLANT
COVER SURGICAL LIGHT HANDLE (MISCELLANEOUS) ×1 IMPLANT
DERMABOND ADVANCED .7 DNX12 (GAUZE/BANDAGES/DRESSINGS) IMPLANT
DRAPE C-ARM 42X120 X-RAY (DRAPES) IMPLANT
DRSG TEGADERM 2-3/8X2-3/4 SM (GAUZE/BANDAGES/DRESSINGS) ×3 IMPLANT
DRSG TEGADERM 4X4.75 (GAUZE/BANDAGES/DRESSINGS) ×1 IMPLANT
ELECT REM PT RETURN 15FT ADLT (MISCELLANEOUS) ×1 IMPLANT
GAUZE SPONGE 2X2 8PLY STRL LF (GAUZE/BANDAGES/DRESSINGS) ×1 IMPLANT
GLOVE BIO SURGEON STRL SZ7.5 (GLOVE) ×1 IMPLANT
GLOVE INDICATOR 8.0 STRL GRN (GLOVE) ×1 IMPLANT
GOWN STRL REUS W/ TWL XL LVL3 (GOWN DISPOSABLE) ×1 IMPLANT
GOWN STRL REUS W/TWL XL LVL3 (GOWN DISPOSABLE) ×1
GRASPER SUT TROCAR 14GX15 (MISCELLANEOUS) IMPLANT
HEMOSTAT ARISTA ABSORB 3G PWDR (HEMOSTASIS) IMPLANT
HEMOSTAT SNOW SURGICEL 2X4 (HEMOSTASIS) IMPLANT
IRRIG SUCT STRYKERFLOW 2 WTIP (MISCELLANEOUS) ×1
IRRIGATION SUCT STRKRFLW 2 WTP (MISCELLANEOUS) ×1 IMPLANT
KIT BASIN OR (CUSTOM PROCEDURE TRAY) ×1 IMPLANT
KIT IMAGING PINPOINTPAQ (MISCELLANEOUS) IMPLANT
KIT TURNOVER KIT A (KITS) IMPLANT
L-HOOK LAP DISP 36CM (ELECTROSURGICAL)
LHOOK LAP DISP 36CM (ELECTROSURGICAL) IMPLANT
POUCH RETRIEVAL ECOSAC 10 (ENDOMECHANICALS) ×1 IMPLANT
SCISSORS LAP 5X35 DISP (ENDOMECHANICALS) ×1 IMPLANT
SET CHOLANGIOGRAPH MIX (MISCELLANEOUS) IMPLANT
SET TUBE SMOKE EVAC HIGH FLOW (TUBING) ×1 IMPLANT
SLEEVE ADV FIXATION 5X100MM (TROCAR) ×1 IMPLANT
SPIKE FLUID TRANSFER (MISCELLANEOUS) ×1 IMPLANT
STRIP CLOSURE SKIN 1/2X4 (GAUZE/BANDAGES/DRESSINGS) ×1 IMPLANT
SUT MNCRL AB 4-0 PS2 18 (SUTURE) ×1 IMPLANT
SUT VIC AB 0 UR5 27 (SUTURE) IMPLANT
SUT VICRYL 0 TIES 12 18 (SUTURE) IMPLANT
SUT VICRYL 0 UR6 27IN ABS (SUTURE) IMPLANT
TOWEL OR 17X26 10 PK STRL BLUE (TOWEL DISPOSABLE) ×1 IMPLANT
TOWEL OR NON WOVEN STRL DISP B (DISPOSABLE) ×1 IMPLANT
TRAY LAPAROSCOPIC (CUSTOM PROCEDURE TRAY) ×1 IMPLANT
TROCAR ADV FIXATION 12X100MM (TROCAR) IMPLANT
TROCAR ADV FIXATION 5X100MM (TROCAR) ×1 IMPLANT
TROCAR BALLN 12MMX100 BLUNT (TROCAR) IMPLANT
TROCAR XCEL NON-BLD 5MMX100MML (ENDOMECHANICALS) IMPLANT

## 2023-01-09 NOTE — Anesthesia Postprocedure Evaluation (Signed)
Anesthesia Post Note  Patient: Valerie Fletcher  Procedure(s) Performed: LAPAROSCOPIC CHOLECYSTECTOMY WITH INTRAOPERATIVE  CHOLANGIOGRAM AND ICG DYE     Patient location during evaluation: PACU Anesthesia Type: General Level of consciousness: awake and alert Pain management: pain level controlled Vital Signs Assessment: post-procedure vital signs reviewed and stable Respiratory status: spontaneous breathing, nonlabored ventilation and respiratory function stable Cardiovascular status: blood pressure returned to baseline and stable Postop Assessment: no apparent nausea or vomiting Anesthetic complications: no   No notable events documented.  Last Vitals:  Vitals:   01/09/23 0957 01/09/23 1000  BP: 116/87 114/86  Pulse: 78 75  Resp:    Temp: 36.4 C   SpO2: 97% 98%    Last Pain:  Vitals:   01/09/23 0957  TempSrc:   PainSc: 3                  Lowella Curb

## 2023-01-09 NOTE — Anesthesia Preprocedure Evaluation (Signed)
Anesthesia Evaluation  Patient identified by MRN, date of birth, ID band Patient awake    Reviewed: Allergy & Precautions, H&P , NPO status , Patient's Chart, lab work & pertinent test results  Airway Mallampati: II  TM Distance: >3 FB Neck ROM: Full    Dental no notable dental hx.    Pulmonary asthma    Pulmonary exam normal breath sounds clear to auscultation       Cardiovascular hypertension, Pt. on medications negative cardio ROS Normal cardiovascular exam Rhythm:Regular Rate:Normal     Neuro/Psych  Headaches  negative psych ROS   GI/Hepatic Neg liver ROS,GERD  ,,  Endo/Other  negative endocrine ROS    Renal/GU negative Renal ROS  negative genitourinary   Musculoskeletal negative musculoskeletal ROS (+)    Abdominal   Peds negative pediatric ROS (+)  Hematology negative hematology ROS (+)   Anesthesia Other Findings   Reproductive/Obstetrics negative OB ROS                             Anesthesia Physical Anesthesia Plan  ASA: 2  Anesthesia Plan: General   Post-op Pain Management: Tylenol PO (pre-op)*   Induction: Intravenous  PONV Risk Score and Plan: 3 and Ondansetron, Dexamethasone, Midazolam and Treatment may vary due to age or medical condition  Airway Management Planned: Oral ETT  Additional Equipment:   Intra-op Plan:   Post-operative Plan: Extubation in OR  Informed Consent: I have reviewed the patients History and Physical, chart, labs and discussed the procedure including the risks, benefits and alternatives for the proposed anesthesia with the patient or authorized representative who has indicated his/her understanding and acceptance.     Dental advisory given  Plan Discussed with: CRNA  Anesthesia Plan Comments:        Anesthesia Quick Evaluation

## 2023-01-09 NOTE — Anesthesia Procedure Notes (Signed)
Procedure Name: Intubation Date/Time: 01/09/2023 7:33 AM  Performed by: Elyn Peers, CRNAPre-anesthesia Checklist: Patient identified, Emergency Drugs available, Suction available, Patient being monitored and Timeout performed Patient Re-evaluated:Patient Re-evaluated prior to induction Oxygen Delivery Method: Circle system utilized Preoxygenation: Pre-oxygenation with 100% oxygen Induction Type: IV induction Ventilation: Mask ventilation without difficulty and Oral airway inserted - appropriate to patient size Laryngoscope Size: Miller and 3 Grade View: Grade II Tube type: Oral Tube size: 7.0 mm Number of attempts: 1 Airway Equipment and Method: Stylet Placement Confirmation: ETT inserted through vocal cords under direct vision, positive ETCO2 and breath sounds checked- equal and bilateral Secured at: 21 cm Tube secured with: Tape Dental Injury: Teeth and Oropharynx as per pre-operative assessment

## 2023-01-09 NOTE — Transfer of Care (Signed)
Immediate Anesthesia Transfer of Care Note  Patient: Valerie Fletcher  Procedure(s) Performed: LAPAROSCOPIC CHOLECYSTECTOMY WITH INTRAOPERATIVE  CHOLANGIOGRAM AND ICG DYE  Patient Location: PACU  Anesthesia Type:General  Level of Consciousness: awake, alert , oriented, and patient cooperative  Airway & Oxygen Therapy: Patient Spontanous Breathing and Patient connected to face mask oxygen  Post-op Assessment: Report given to RN, Post -op Vital signs reviewed and stable, and BP 108/82  Pulse 90  Resp 14  Pulse Ox 99%  Post vital signs: Reviewed and stable  Last Vitals:  Vitals Value Taken Time  BP    Temp    Pulse    Resp    SpO2      Last Pain:  Vitals:   01/09/23 0623  TempSrc: Oral  PainSc:       Patients Stated Pain Goal: 3 (01/09/23 0550)  Complications: No notable events documented.

## 2023-01-09 NOTE — Op Note (Signed)
Valerie Fletcher [id number] 25-Aug-1970 01/09/2023  Laparoscopic Cholecystectomy with IOC and near infrared fluorescent cholangiography procedure Note  Indications: This patient presents with symptomatic gallbladder disease and will undergo laparoscopic cholecystectomy.  Pre-operative Diagnosis: Symptomatic cholelithiasis  Post-operative Diagnosis: Same  Surgeon: Gaynelle Adu MD FACS   Assistants: None  Anesthesia: General endotracheal anesthesia  Procedure Details  The patient was seen again in the Holding Room. The risks, benefits, complications, treatment options, and expected outcomes were discussed with the patient. The possibilities of reaction to medication, pulmonary aspiration, perforation of viscus, bleeding, recurrent infection, finding a normal gallbladder, the need for additional procedures, failure to diagnose a condition, the possible need to convert to an open procedure, and creating a complication requiring transfusion or operation were discussed with the patient. The likelihood of improving the patient's symptoms with return to their baseline status is good.  The patient and/or family concurred with the proposed plan, giving informed consent. The site of surgery properly noted. The patient was taken to Operating Room, identified as Valerie Fletcher and the procedure verified as Laparoscopic Cholecystectomy with Intraoperative Cholangiogram. A Time Out was held and the above information confirmed. Antibiotic prophylaxis was administered.  Patient was also given indocyanine green preoperatively.  Prior to the induction of general anesthesia, antibiotic prophylaxis was administered. General endotracheal anesthesia was then administered and tolerated well. After the induction, the abdomen was prepped with Chloraprep and draped in the sterile fashion. The patient was positioned in the supine position.  Patient had a remote history of an open epigastric hernia repair with mesh.   Therefore I decided to Optiview into the abdomen in the left upper quadrant.  A small incision was made below the left subcostal margin slightly lateral to Palmer's point.  Then using a 0 degree 5 mm laparoscope through a 5 mm trocar I advanced the camera and trocar through all layers of the abdominal wall and carefully entered the abdominal cavity.  Pneumoperitoneum was smoothly established.  There were no change in patient vital signs.  The laparoscope was advanced and the abdominal cavity was surveilled.  There is no evidence of injury to surrounding structures.  Patient had evidence of a prior supraumbilical/epigastric hernia repair.  There were no adhesions to the anterior abdominal wall.  It appeared the mesh was probably in the preperitoneal space.  Patient did have a fascial defect at her umbilicus.  So I made a vertical infraumbilical incision and placed a 12 mm trocar through that pre-existing fascial defect   Two 5-mm ports were placed in the right upper quadrant. All skin incisions were infiltrated with a local anesthetic agent before making the incision and placing the trocars.   We positioned the patient in reverse Trendelenburg, tilted slightly to the patient's left.  The gallbladder was identified, the fundus grasped and retracted cephalad. Adhesions were lysed bluntly and with the electrocautery where indicated, taking care not to injure any adjacent organs or viscus. The infundibulum was grasped and retracted laterally, exposing the peritoneum overlying the triangle of Calot. This was then divided and exposed in a blunt fashion. A critical view of the cystic duct and cystic artery was obtained.  Patient had a small posterior branch of the cystic artery as well going into the gallbladder.  The cystic duct was clearly identified and bluntly dissected circumferentially.  Near infrared fluorescent cholangiography was performed.  We could see activity within the common hepatic duct, common bile  duct, small bowel and cystic duct.  This served as Ambulance person  confirmation of our anatomy.  The cystic duct was ligated with a clip distally.  I went ahead and placed clips on the anterior and posterior branches of the cystic artery.  An incision was made in the cystic duct and the [facility] cholangiogram catheter introduced. The catheter was secured using a clip. A cholangiogram was then obtained which showed good visualization of the distal and proximal biliary tree with no sign of filling defects or obstruction.  Contrast flowed easily into the duodenum. The catheter was then removed.   The cystic duct was then ligated with clips and divided. The cystic artery (both the anterior and posterior branches which had been identified & dissected free which had been ligated with clips) was divided as well.   The gallbladder was dissected from the liver bed in retrograde fashion with the electrocautery. The gallbladder was removed and placed in an Ecco sac.  The gallbladder and Ecco sac were then removed through the umbilical port site. The liver bed was inspected. Hemostasis was achieved with the electrocautery.  The fascial defect at the umbilicus was closed with 2 interrupted 0 Vicryl sutures using PMI suture passer with laparoscopic guidance.  We again inspected the right upper quadrant for hemostasis.  The umbilical closure was inspected and there was no air leak and nothing trapped within the closure. Pneumoperitoneum was released as we removed the trocars.  4-0 Monocryl was used to close the skin.  Dermabond was applied. The patient was then extubated and brought to the recovery room in stable condition. Instrument, sponge, and needle counts were correct at closure and at the conclusion of the case.   Findings: Cholelithiasis +critical view Normal IOC Mesh in preperitoneal space - no intra-abdominal adhesions  Estimated Blood Loss: Minimal         Drains: none         Specimens: Gallbladder            Complications: None; patient tolerated the procedure well.         Disposition: PACU - hemodynamically stable.         Condition: stable  Mary Sella. Andrey Campanile, MD, FACS General, Bariatric, & Minimally Invasive Surgery [facility] Surgery,  A [facility]

## 2023-01-09 NOTE — Progress Notes (Signed)
Preg neg

## 2023-01-09 NOTE — Discharge Instructions (Signed)

## 2023-01-10 ENCOUNTER — Encounter (HOSPITAL_COMMUNITY): Payer: Self-pay | Admitting: General Surgery

## 2023-01-11 LAB — SURGICAL PATHOLOGY

## 2023-04-11 ENCOUNTER — Other Ambulatory Visit: Payer: Self-pay | Admitting: Physician Assistant

## 2023-04-11 DIAGNOSIS — Z0000 Encounter for general adult medical examination without abnormal findings: Secondary | ICD-10-CM

## 2023-04-13 ENCOUNTER — Ambulatory Visit
Admission: RE | Admit: 2023-04-13 | Discharge: 2023-04-13 | Disposition: A | Discharge status: Home / Self Care | Payer: BC Managed Care – PPO | Source: Ambulatory Visit | Attending: Physician Assistant | Admitting: Physician Assistant

## 2023-04-13 DIAGNOSIS — Z0000 Encounter for general adult medical examination without abnormal findings: Secondary | ICD-10-CM

## 2023-04-13 DIAGNOSIS — Z1231 Encounter for screening mammogram for malignant neoplasm of breast: Secondary | ICD-10-CM | POA: Diagnosis not present

## 2023-07-17 DIAGNOSIS — Z0000 Encounter for general adult medical examination without abnormal findings: Secondary | ICD-10-CM | POA: Diagnosis not present

## 2024-01-10 DIAGNOSIS — Z124 Encounter for screening for malignant neoplasm of cervix: Secondary | ICD-10-CM | POA: Diagnosis not present

## 2024-02-08 DIAGNOSIS — Z124 Encounter for screening for malignant neoplasm of cervix: Secondary | ICD-10-CM | POA: Diagnosis not present
